# Patient Record
Sex: Female | Born: 1935 | Race: White | Hispanic: No | Marital: Single | Smoking: Never smoker
Health system: Southern US, Community
[De-identification: ages and names within clinical notes are randomized; demographics above are authoritative.]

## PROBLEM LIST (undated history)

## (undated) DIAGNOSIS — S72009A Fracture of unspecified part of neck of unspecified femur, initial encounter for closed fracture: Secondary | ICD-10-CM

## (undated) DIAGNOSIS — C50919 Malignant neoplasm of unspecified site of unspecified female breast: Secondary | ICD-10-CM

## (undated) DIAGNOSIS — F329 Major depressive disorder, single episode, unspecified: Secondary | ICD-10-CM

## (undated) DIAGNOSIS — K219 Gastro-esophageal reflux disease without esophagitis: Secondary | ICD-10-CM

## (undated) DIAGNOSIS — Z87898 Personal history of other specified conditions: Secondary | ICD-10-CM

## (undated) DIAGNOSIS — D649 Anemia, unspecified: Secondary | ICD-10-CM

## (undated) DIAGNOSIS — I2699 Other pulmonary embolism without acute cor pulmonale: Secondary | ICD-10-CM

## (undated) DIAGNOSIS — G629 Polyneuropathy, unspecified: Secondary | ICD-10-CM

## (undated) DIAGNOSIS — I10 Essential (primary) hypertension: Secondary | ICD-10-CM

## (undated) DIAGNOSIS — F32A Depression, unspecified: Secondary | ICD-10-CM

## (undated) DIAGNOSIS — E079 Disorder of thyroid, unspecified: Secondary | ICD-10-CM

## (undated) DIAGNOSIS — R42 Dizziness and giddiness: Secondary | ICD-10-CM

## (undated) DIAGNOSIS — I639 Cerebral infarction, unspecified: Secondary | ICD-10-CM

## (undated) DIAGNOSIS — R251 Tremor, unspecified: Secondary | ICD-10-CM

## (undated) DIAGNOSIS — S72002D Fracture of unspecified part of neck of left femur, subsequent encounter for closed fracture with routine healing: Secondary | ICD-10-CM

## (undated) DIAGNOSIS — E0590 Thyrotoxicosis, unspecified without thyrotoxic crisis or storm: Secondary | ICD-10-CM

## (undated) DIAGNOSIS — R0600 Dyspnea, unspecified: Secondary | ICD-10-CM

## (undated) DIAGNOSIS — F419 Anxiety disorder, unspecified: Secondary | ICD-10-CM

## (undated) DIAGNOSIS — M810 Age-related osteoporosis without current pathological fracture: Secondary | ICD-10-CM

## (undated) DIAGNOSIS — R519 Headache, unspecified: Secondary | ICD-10-CM

## (undated) DIAGNOSIS — I4891 Unspecified atrial fibrillation: Secondary | ICD-10-CM

## (undated) DIAGNOSIS — M797 Fibromyalgia: Secondary | ICD-10-CM

## (undated) DIAGNOSIS — C801 Malignant (primary) neoplasm, unspecified: Secondary | ICD-10-CM

## (undated) DIAGNOSIS — K589 Irritable bowel syndrome without diarrhea: Secondary | ICD-10-CM

## (undated) DIAGNOSIS — R51 Headache: Secondary | ICD-10-CM

## (undated) DIAGNOSIS — M1990 Unspecified osteoarthritis, unspecified site: Secondary | ICD-10-CM

## (undated) HISTORY — PX: COLON SURGERY: SHX602

## (undated) HISTORY — PX: ABDOMINAL HYSTERECTOMY: SHX81

## (undated) HISTORY — DX: Malignant neoplasm of unspecified site of unspecified female breast: C50.919

## (undated) HISTORY — PX: VENA CAVA FILTER PLACEMENT: SUR1032

## (undated) HISTORY — DX: Fracture of unspecified part of neck of left femur, subsequent encounter for closed fracture with routine healing: S72.002D

## (undated) HISTORY — PX: CATARACT EXTRACTION: SUR2

## (undated) HISTORY — DX: Fibromyalgia: M79.7

## (undated) HISTORY — PX: CHOLECYSTECTOMY: SHX55

## (undated) HISTORY — DX: Malignant (primary) neoplasm, unspecified: C80.1

## (undated) HISTORY — DX: Other pulmonary embolism without acute cor pulmonale: I26.99

## (undated) HISTORY — PX: MASTECTOMY: SHX3

## (undated) HISTORY — DX: Fracture of unspecified part of neck of unspecified femur, initial encounter for closed fracture: S72.009A

---

## 2003-12-05 ENCOUNTER — Ambulatory Visit: Payer: Self-pay | Admitting: Physician Assistant

## 2004-01-06 ENCOUNTER — Ambulatory Visit: Payer: Self-pay | Admitting: Otolaryngology

## 2004-03-14 ENCOUNTER — Ambulatory Visit: Payer: Self-pay

## 2004-03-16 ENCOUNTER — Ambulatory Visit: Payer: Self-pay

## 2004-04-23 ENCOUNTER — Ambulatory Visit: Payer: Self-pay | Admitting: Ophthalmology

## 2004-04-30 ENCOUNTER — Ambulatory Visit: Payer: Self-pay | Admitting: Ophthalmology

## 2004-08-08 ENCOUNTER — Ambulatory Visit: Payer: Self-pay | Admitting: Internal Medicine

## 2005-08-16 ENCOUNTER — Ambulatory Visit: Payer: Self-pay | Admitting: Internal Medicine

## 2006-01-28 ENCOUNTER — Ambulatory Visit: Payer: Self-pay | Admitting: Internal Medicine

## 2006-03-30 ENCOUNTER — Other Ambulatory Visit: Payer: Self-pay

## 2006-03-30 ENCOUNTER — Inpatient Hospital Stay: Payer: Self-pay | Admitting: Internal Medicine

## 2007-05-25 ENCOUNTER — Emergency Department: Payer: Self-pay | Admitting: Emergency Medicine

## 2007-08-11 ENCOUNTER — Inpatient Hospital Stay: Payer: Self-pay | Admitting: Unknown Physician Specialty

## 2007-08-12 ENCOUNTER — Other Ambulatory Visit: Payer: Self-pay

## 2007-10-09 ENCOUNTER — Ambulatory Visit: Payer: Self-pay | Admitting: Internal Medicine

## 2007-10-27 ENCOUNTER — Ambulatory Visit: Payer: Self-pay | Admitting: Surgery

## 2007-11-23 ENCOUNTER — Ambulatory Visit: Payer: Self-pay | Admitting: Surgery

## 2007-12-17 ENCOUNTER — Inpatient Hospital Stay: Payer: Self-pay | Admitting: Surgery

## 2007-12-24 ENCOUNTER — Ambulatory Visit: Payer: Self-pay | Admitting: Surgery

## 2008-01-03 ENCOUNTER — Ambulatory Visit: Payer: Self-pay | Admitting: Oncology

## 2008-01-11 ENCOUNTER — Ambulatory Visit: Payer: Self-pay | Admitting: Oncology

## 2008-02-02 ENCOUNTER — Ambulatory Visit: Payer: Self-pay | Admitting: Oncology

## 2010-07-11 ENCOUNTER — Ambulatory Visit: Payer: Self-pay

## 2010-12-20 ENCOUNTER — Ambulatory Visit: Payer: Self-pay | Admitting: Gastroenterology

## 2011-01-09 ENCOUNTER — Ambulatory Visit: Payer: Self-pay | Admitting: Gastroenterology

## 2011-01-14 LAB — PATHOLOGY REPORT

## 2011-03-11 ENCOUNTER — Other Ambulatory Visit: Payer: Self-pay | Admitting: Gastroenterology

## 2011-10-17 ENCOUNTER — Ambulatory Visit: Payer: Self-pay | Admitting: Physical Medicine and Rehabilitation

## 2011-11-06 ENCOUNTER — Inpatient Hospital Stay: Payer: Self-pay | Admitting: Internal Medicine

## 2011-11-06 DIAGNOSIS — I369 Nonrheumatic tricuspid valve disorder, unspecified: Secondary | ICD-10-CM

## 2011-11-06 LAB — CK TOTAL AND CKMB (NOT AT ARMC)
CK, Total: 74 U/L (ref 21–215)
CK-MB: 0.9 ng/mL (ref 0.5–3.6)

## 2011-11-06 LAB — URINALYSIS, COMPLETE
Bilirubin,UR: NEGATIVE
Glucose,UR: NEGATIVE mg/dL (ref 0–75)
Ph: 5 (ref 4.5–8.0)
Specific Gravity: 1.057 (ref 1.003–1.030)
Squamous Epithelial: NONE SEEN
WBC UR: 22 /HPF (ref 0–5)

## 2011-11-06 LAB — CBC
MCH: 29.9 pg (ref 26.0–34.0)
MCV: 89 fL (ref 80–100)
Platelet: 228 10*3/uL (ref 150–440)
RBC: 4.44 10*6/uL (ref 3.80–5.20)
RDW: 14.2 % (ref 11.5–14.5)

## 2011-11-06 LAB — COMPREHENSIVE METABOLIC PANEL
Alkaline Phosphatase: 76 U/L (ref 50–136)
BUN: 14 mg/dL (ref 7–18)
Bilirubin,Total: 0.6 mg/dL (ref 0.2–1.0)
Chloride: 104 mmol/L (ref 98–107)
Co2: 25 mmol/L (ref 21–32)
Creatinine: 0.99 mg/dL (ref 0.60–1.30)
Osmolality: 273 (ref 275–301)
Potassium: 3.7 mmol/L (ref 3.5–5.1)
SGPT (ALT): 18 U/L (ref 12–78)
Sodium: 136 mmol/L (ref 136–145)
Total Protein: 8.2 g/dL (ref 6.4–8.2)

## 2011-11-06 LAB — APTT
Activated PTT: 31.7 secs (ref 23.6–35.9)
Activated PTT: >160 secs (ref 23.6–35.9)

## 2011-11-06 LAB — PROTIME-INR
INR: 1
Prothrombin Time: 13.8 secs (ref 11.5–14.7)

## 2011-11-06 LAB — LIPASE, BLOOD: Lipase: 108 U/L (ref 73–393)

## 2011-11-06 LAB — TROPONIN I: Troponin-I: <0.02 ng/mL

## 2011-11-07 LAB — CBC WITH DIFFERENTIAL/PLATELET
Basophil #: 0.1 10*3/uL (ref 0.0–0.1)
Eosinophil #: 0.1 10*3/uL (ref 0.0–0.7)
Eosinophil %: 0.4 %
HGB: 12.3 g/dL (ref 12.0–16.0)
Lymphocyte #: 1.1 10*3/uL (ref 1.0–3.6)
Lymphocyte %: 7.5 %
MCH: 29.9 pg (ref 26.0–34.0)
MCHC: 32.6 g/dL (ref 32.0–36.0)
Monocyte #: 2.2 x10 3/mm — ABNORMAL HIGH (ref 0.2–0.9)
Neutrophil %: 76.6 %
Platelet: 220 10*3/uL (ref 150–440)
RDW: 14.5 % (ref 11.5–14.5)

## 2011-11-07 LAB — APTT
Activated PTT: 135.5 secs — ABNORMAL HIGH (ref 23.6–35.9)
Activated PTT: 113.7 secs — ABNORMAL HIGH (ref 23.6–35.9)

## 2011-11-07 LAB — BASIC METABOLIC PANEL
Anion Gap: 7 (ref 7–16)
BUN: 13 mg/dL (ref 7–18)
Calcium, Total: 8.6 mg/dL (ref 8.5–10.1)
Chloride: 105 mmol/L (ref 98–107)
Creatinine: 0.88 mg/dL (ref 0.60–1.30)
EGFR (Non-African Amer.): >60
Glucose: 146 mg/dL — ABNORMAL HIGH (ref 65–99)
Osmolality: 277 (ref 275–301)

## 2011-11-07 LAB — CK TOTAL AND CKMB (NOT AT ARMC)
CK, Total: 62 U/L (ref 21–215)
CK-MB: 0.7 ng/mL (ref 0.5–3.6)

## 2011-11-07 LAB — MAGNESIUM: Magnesium: 1.8 mg/dL

## 2011-11-07 LAB — TROPONIN I: Troponin-I: <0.02 ng/mL

## 2011-11-11 LAB — BASIC METABOLIC PANEL
Calcium, Total: 9.3 mg/dL (ref 8.5–10.1)
Co2: 28 mmol/L (ref 21–32)
Creatinine: 0.66 mg/dL (ref 0.60–1.30)
EGFR (African American): >60
EGFR (Non-African Amer.): >60
Osmolality: 273 (ref 275–301)
Potassium: 3.8 mmol/L (ref 3.5–5.1)
Sodium: 136 mmol/L (ref 136–145)

## 2011-11-11 LAB — CBC WITH DIFFERENTIAL/PLATELET
Basophil #: 0.1 10*3/uL (ref 0.0–0.1)
Basophil %: 0.6 %
Eosinophil #: 0.4 10*3/uL (ref 0.0–0.7)
HCT: 34.1 % — ABNORMAL LOW (ref 35.0–47.0)
HGB: 11.4 g/dL — ABNORMAL LOW (ref 12.0–16.0)
Lymphocyte %: 16.6 %
MCH: 29.9 pg (ref 26.0–34.0)
MCHC: 33.4 g/dL (ref 32.0–36.0)
Monocyte #: 1.2 x10 3/mm — ABNORMAL HIGH (ref 0.2–0.9)
Neutrophil #: 5.8 10*3/uL (ref 1.4–6.5)
Neutrophil %: 65 %
RDW: 14.1 % (ref 11.5–14.5)

## 2011-11-14 LAB — URINE CULTURE

## 2011-12-05 ENCOUNTER — Emergency Department: Payer: Self-pay | Admitting: Emergency Medicine

## 2011-12-05 LAB — COMPREHENSIVE METABOLIC PANEL
Albumin: 3.2 g/dL — ABNORMAL LOW (ref 3.4–5.0)
Anion Gap: 9 (ref 7–16)
BUN: 11 mg/dL (ref 7–18)
Chloride: 104 mmol/L (ref 98–107)
EGFR (African American): >60
Glucose: 104 mg/dL — ABNORMAL HIGH (ref 65–99)
Osmolality: 285 (ref 275–301)
Potassium: 2.8 mmol/L — ABNORMAL LOW (ref 3.5–5.1)
Sodium: 143 mmol/L (ref 136–145)
Total Protein: 7.5 g/dL (ref 6.4–8.2)

## 2011-12-05 LAB — URINALYSIS, COMPLETE
Glucose,UR: NEGATIVE mg/dL (ref 0–75)
Ketone: NEGATIVE
Nitrite: NEGATIVE
Ph: 7 (ref 4.5–8.0)
Protein: NEGATIVE
Specific Gravity: 1.01 (ref 1.003–1.030)

## 2011-12-05 LAB — CBC
HCT: 34.1 % — ABNORMAL LOW (ref 35.0–47.0)
MCH: 30.2 pg (ref 26.0–34.0)
MCV: 89 fL (ref 80–100)
WBC: 11 10*3/uL (ref 3.6–11.0)

## 2011-12-05 LAB — LIPASE, BLOOD: Lipase: 135 U/L (ref 73–393)

## 2012-09-23 ENCOUNTER — Inpatient Hospital Stay: Payer: Self-pay | Admitting: Internal Medicine

## 2012-09-23 LAB — CBC
HGB: 6 g/dL — ABNORMAL LOW (ref 12.0–16.0)
MCH: 19 pg — ABNORMAL LOW (ref 26.0–34.0)
MCV: 64 fL — ABNORMAL LOW (ref 80–100)
Platelet: 454 10*3/uL — ABNORMAL HIGH (ref 150–440)
RBC: 3.18 10*6/uL — ABNORMAL LOW (ref 3.80–5.20)
RDW: 17.5 % — ABNORMAL HIGH (ref 11.5–14.5)

## 2012-09-23 LAB — COMPREHENSIVE METABOLIC PANEL
Albumin: 3.5 g/dL (ref 3.4–5.0)
Alkaline Phosphatase: 83 U/L (ref 50–136)
Anion Gap: 6 — ABNORMAL LOW (ref 7–16)
BUN: 15 mg/dL (ref 7–18)
Calcium, Total: 8.7 mg/dL (ref 8.5–10.1)
Chloride: 101 mmol/L (ref 98–107)
Creatinine: 1.19 mg/dL (ref 0.60–1.30)
EGFR (African American): 50 — ABNORMAL LOW
Glucose: 102 mg/dL — ABNORMAL HIGH (ref 65–99)
Potassium: 3.6 mmol/L (ref 3.5–5.1)
SGOT(AST): 18 U/L (ref 15–37)
SGPT (ALT): 13 U/L (ref 12–78)
Sodium: 133 mmol/L — ABNORMAL LOW (ref 136–145)
Total Protein: 8.1 g/dL (ref 6.4–8.2)

## 2012-09-23 LAB — TROPONIN I: Troponin-I: <0.02 ng/mL

## 2012-09-24 LAB — BASIC METABOLIC PANEL
Anion Gap: 6 — ABNORMAL LOW (ref 7–16)
EGFR (Non-African Amer.): 58 — ABNORMAL LOW
Glucose: 91 mg/dL (ref 65–99)
Osmolality: 276 (ref 275–301)
Potassium: 3.9 mmol/L (ref 3.5–5.1)
Sodium: 139 mmol/L (ref 136–145)

## 2012-09-24 LAB — CBC WITH DIFFERENTIAL/PLATELET
Basophil #: 0.1 10*3/uL (ref 0.0–0.1)
Basophil %: 0.9 %
Eosinophil #: 0.5 10*3/uL (ref 0.0–0.7)
Eosinophil %: 6.7 %
HCT: 25.1 % — ABNORMAL LOW (ref 35.0–47.0)
HGB: 8.2 g/dL — ABNORMAL LOW (ref 12.0–16.0)
Monocyte #: 1.1 x10 3/mm — ABNORMAL HIGH (ref 0.2–0.9)
Neutrophil #: 3.4 10*3/uL (ref 1.4–6.5)
Platelet: 342 10*3/uL (ref 150–440)
WBC: 6.7 10*3/uL (ref 3.6–11.0)

## 2012-09-24 LAB — URINALYSIS, COMPLETE
Glucose,UR: NEGATIVE mg/dL (ref 0–75)
Ketone: NEGATIVE
Protein: NEGATIVE
Specific Gravity: 1.009 (ref 1.003–1.030)

## 2012-09-25 LAB — WBCS, STOOL

## 2012-09-25 LAB — CBC WITH DIFFERENTIAL/PLATELET
Basophil #: 0.1 10*3/uL (ref 0.0–0.1)
Eosinophil %: 6.6 %
HGB: 8.5 g/dL — ABNORMAL LOW (ref 12.0–16.0)
Lymphocyte #: 2.2 10*3/uL (ref 1.0–3.6)
Lymphocyte %: 28 %
MCH: 22.5 pg — ABNORMAL LOW (ref 26.0–34.0)
MCHC: 32.3 g/dL (ref 32.0–36.0)
MCV: 70 fL — ABNORMAL LOW (ref 80–100)
Monocyte %: 13.2 %
Neutrophil #: 4.1 10*3/uL (ref 1.4–6.5)
WBC: 8 10*3/uL (ref 3.6–11.0)

## 2012-09-25 LAB — OCCULT BLOOD X 1 CARD TO LAB, STOOL
Occult Blood, Feces: NEGATIVE
Occult Blood, Feces: NEGATIVE

## 2012-09-26 LAB — STOOL CULTURE

## 2012-10-16 ENCOUNTER — Ambulatory Visit: Payer: Self-pay | Admitting: Surgery

## 2012-10-22 ENCOUNTER — Ambulatory Visit: Payer: Self-pay | Admitting: Surgery

## 2012-10-22 LAB — HEMOGLOBIN: HGB: 10.5 g/dL — ABNORMAL LOW (ref 12.0–16.0)

## 2012-10-29 ENCOUNTER — Inpatient Hospital Stay: Payer: Self-pay | Admitting: Surgery

## 2012-10-30 LAB — CBC WITH DIFFERENTIAL/PLATELET
Basophil #: 0 10*3/uL (ref 0.0–0.1)
Basophil %: 0.2 %
Basophil: 1 %
Eosinophil #: 0 10*3/uL (ref 0.0–0.7)
HCT: 37 % (ref 35.0–47.0)
HGB: 12 g/dL (ref 12.0–16.0)
Lymphocyte #: 1.5 10*3/uL (ref 1.0–3.6)
Lymphocyte %: 9.7 %
Lymphocytes: 12 %
MCHC: 32.5 g/dL (ref 32.0–36.0)
MCV: 80 fL (ref 80–100)
Monocyte #: 2.6 x10 3/mm — ABNORMAL HIGH (ref 0.2–0.9)
Monocyte %: 17.2 %
Monocytes: 15 %
Neutrophil #: 11.1 10*3/uL — ABNORMAL HIGH (ref 1.4–6.5)
Neutrophil %: 72.8 %
Platelet: 240 10*3/uL (ref 150–440)
WBC: 15.2 10*3/uL — ABNORMAL HIGH (ref 3.6–11.0)

## 2012-10-30 LAB — BASIC METABOLIC PANEL
BUN: 6 mg/dL — ABNORMAL LOW (ref 7–18)
Calcium, Total: 8.7 mg/dL (ref 8.5–10.1)
Co2: 29 mmol/L (ref 21–32)
Creatinine: 0.95 mg/dL (ref 0.60–1.30)
EGFR (African American): >60
Osmolality: 274 (ref 275–301)
Potassium: 3.8 mmol/L (ref 3.5–5.1)

## 2012-11-01 LAB — CBC WITH DIFFERENTIAL/PLATELET
Eosinophil #: 0.5 10*3/uL (ref 0.0–0.7)
HCT: 34.1 % — ABNORMAL LOW (ref 35.0–47.0)
HGB: 11.2 g/dL — ABNORMAL LOW (ref 12.0–16.0)
Lymphocyte %: 12.7 %
MCH: 26.2 pg (ref 26.0–34.0)
MCV: 80 fL (ref 80–100)
Monocyte #: 2 x10 3/mm — ABNORMAL HIGH (ref 0.2–0.9)
Monocyte %: 14.3 %
Neutrophil #: 9.9 10*3/uL — ABNORMAL HIGH (ref 1.4–6.5)
WBC: 14.2 10*3/uL — ABNORMAL HIGH (ref 3.6–11.0)

## 2012-11-01 LAB — URINALYSIS, COMPLETE
Bilirubin,UR: NEGATIVE
Glucose,UR: NEGATIVE mg/dL (ref 0–75)
Hyaline Cast: 31
Ketone: NEGATIVE
Nitrite: NEGATIVE
Ph: 5 (ref 4.5–8.0)
Protein: NEGATIVE
RBC,UR: 4 /HPF (ref 0–5)
Specific Gravity: 1.01 (ref 1.003–1.030)
WBC UR: 7 /HPF (ref 0–5)

## 2012-11-03 ENCOUNTER — Encounter: Payer: Self-pay | Admitting: General Surgery

## 2012-11-04 LAB — HEMOGLOBIN: HGB: 10.9 g/dL — ABNORMAL LOW (ref 12.0–16.0)

## 2012-11-05 LAB — PLATELET COUNT: Platelet: 321 10*3/uL (ref 150–440)

## 2012-11-05 LAB — CLOSTRIDIUM DIFFICILE BY PCR

## 2012-11-05 LAB — CREATININE, SERUM: Creatinine: 0.93 mg/dL (ref 0.60–1.30)

## 2012-11-09 ENCOUNTER — Inpatient Hospital Stay: Payer: Self-pay | Admitting: General Surgery

## 2012-11-09 LAB — CBC
HCT: 30.4 % — ABNORMAL LOW (ref 35.0–47.0)
MCHC: 33.3 g/dL (ref 32.0–36.0)
MCV: 79 fL — ABNORMAL LOW (ref 80–100)
RBC: 3.82 10*6/uL (ref 3.80–5.20)
RDW: 26.9 % — ABNORMAL HIGH (ref 11.5–14.5)
WBC: 18.9 10*3/uL — ABNORMAL HIGH (ref 3.6–11.0)

## 2012-11-09 LAB — COMPREHENSIVE METABOLIC PANEL
Albumin: 2.5 g/dL — ABNORMAL LOW (ref 3.4–5.0)
Anion Gap: 8 (ref 7–16)
BUN: 13 mg/dL (ref 7–18)
Bilirubin,Total: 0.4 mg/dL (ref 0.2–1.0)
Chloride: 96 mmol/L — ABNORMAL LOW (ref 98–107)
Co2: 27 mmol/L (ref 21–32)
Creatinine: 1.01 mg/dL (ref 0.60–1.30)
EGFR (African American): >60
EGFR (Non-African Amer.): 53 — ABNORMAL LOW
SGOT(AST): 61 U/L — ABNORMAL HIGH (ref 15–37)
SGPT (ALT): 44 U/L (ref 12–78)
Sodium: 131 mmol/L — ABNORMAL LOW (ref 136–145)
Total Protein: 7.8 g/dL (ref 6.4–8.2)

## 2012-11-10 LAB — APTT: Activated PTT: 29.2 secs (ref 23.6–35.9)

## 2012-11-10 LAB — PROTIME-INR
INR: 1.1
Prothrombin Time: 14.4 secs (ref 11.5–14.7)

## 2012-11-10 LAB — URINALYSIS, COMPLETE
Bilirubin,UR: NEGATIVE
Ketone: NEGATIVE
Ph: 6 (ref 4.5–8.0)
RBC,UR: 4 /HPF (ref 0–5)
Specific Gravity: 1.057 (ref 1.003–1.030)
WBC UR: 40 /HPF (ref 0–5)

## 2012-11-11 LAB — BASIC METABOLIC PANEL
Anion Gap: 7 (ref 7–16)
BUN: 4 mg/dL — ABNORMAL LOW (ref 7–18)
Calcium, Total: 8.4 mg/dL — ABNORMAL LOW (ref 8.5–10.1)
EGFR (Non-African Amer.): 52 — ABNORMAL LOW
Sodium: 134 mmol/L — ABNORMAL LOW (ref 136–145)

## 2012-11-11 LAB — CLOSTRIDIUM DIFFICILE BY PCR

## 2012-11-11 LAB — CBC WITH DIFFERENTIAL/PLATELET
Basophil #: 0.1 10*3/uL (ref 0.0–0.1)
Eosinophil #: 0.2 10*3/uL (ref 0.0–0.7)
Eosinophil %: 1.4 %
Lymphocyte %: 11.9 %
MCH: 26.6 pg (ref 26.0–34.0)
MCHC: 32.8 g/dL (ref 32.0–36.0)
MCV: 81 fL (ref 80–100)
Neutrophil #: 10.8 10*3/uL — ABNORMAL HIGH (ref 1.4–6.5)
Neutrophil %: 74.7 %
Platelet: 352 10*3/uL (ref 150–440)
RDW: 26.5 % — ABNORMAL HIGH (ref 11.5–14.5)
WBC: 14.5 10*3/uL — ABNORMAL HIGH (ref 3.6–11.0)

## 2012-11-11 LAB — PATHOLOGY REPORT

## 2012-11-14 LAB — CULTURE, BLOOD (SINGLE)

## 2012-11-15 LAB — MISC AER/ANAEROBIC CULT.

## 2012-11-16 LAB — CBC WITH DIFFERENTIAL/PLATELET
Basophil %: 0.8 %
Eosinophil #: 0.4 10*3/uL (ref 0.0–0.7)
Eosinophil %: 4.5 %
HCT: 27.5 % — ABNORMAL LOW (ref 35.0–47.0)
HGB: 9 g/dL — ABNORMAL LOW (ref 12.0–16.0)
Lymphocyte #: 2.1 10*3/uL (ref 1.0–3.6)
Lymphocyte %: 23.1 %
Monocyte #: 1.4 x10 3/mm — ABNORMAL HIGH (ref 0.2–0.9)
Monocyte %: 15.1 %
Neutrophil #: 5.1 10*3/uL (ref 1.4–6.5)
Neutrophil %: 56.5 %
RBC: 3.4 10*6/uL — ABNORMAL LOW (ref 3.80–5.20)
WBC: 9 10*3/uL (ref 3.6–11.0)

## 2012-11-16 LAB — COMPREHENSIVE METABOLIC PANEL
Albumin: 2.1 g/dL — ABNORMAL LOW (ref 3.4–5.0)
Alkaline Phosphatase: 124 U/L (ref 50–136)
Anion Gap: 6 — ABNORMAL LOW (ref 7–16)
Bilirubin,Total: 0.2 mg/dL (ref 0.2–1.0)
Calcium, Total: 9.1 mg/dL (ref 8.5–10.1)
Chloride: 101 mmol/L (ref 98–107)
Co2: 26 mmol/L (ref 21–32)
EGFR (African American): >60
EGFR (Non-African Amer.): >60
Glucose: 97 mg/dL (ref 65–99)
Osmolality: 264 (ref 275–301)
Potassium: 4.3 mmol/L (ref 3.5–5.1)
SGOT(AST): 37 U/L (ref 15–37)
SGPT (ALT): 23 U/L (ref 12–78)
Sodium: 133 mmol/L — ABNORMAL LOW (ref 136–145)
Total Protein: 6.9 g/dL (ref 6.4–8.2)

## 2012-11-16 LAB — PROTIME-INR: INR: 1

## 2012-11-18 ENCOUNTER — Inpatient Hospital Stay: Payer: Self-pay | Admitting: Surgery

## 2012-11-18 LAB — CBC WITH DIFFERENTIAL/PLATELET
Basophil #: 0.1 10*3/uL (ref 0.0–0.1)
Basophil %: 0.8 %
Eosinophil #: 0.2 10*3/uL (ref 0.0–0.7)
Eosinophil %: 1.2 %
HCT: 34.2 % — ABNORMAL LOW (ref 35.0–47.0)
HGB: 11.1 g/dL — ABNORMAL LOW (ref 12.0–16.0)
Lymphocyte #: 2.3 10*3/uL (ref 1.0–3.6)
Lymphocyte %: 14.6 %
MCH: 26.3 pg (ref 26.0–34.0)
MCV: 82 fL (ref 80–100)
Monocyte #: 1.4 x10 3/mm — ABNORMAL HIGH (ref 0.2–0.9)
Monocyte %: 9.1 %
Neutrophil #: 11.6 10*3/uL — ABNORMAL HIGH (ref 1.4–6.5)
Platelet: 543 10*3/uL — ABNORMAL HIGH (ref 150–440)
RBC: 4.2 10*6/uL (ref 3.80–5.20)
WBC: 15.6 10*3/uL — ABNORMAL HIGH (ref 3.6–11.0)

## 2012-11-18 LAB — COMPREHENSIVE METABOLIC PANEL
Albumin: 2.5 g/dL — ABNORMAL LOW (ref 3.4–5.0)
Anion Gap: 7 (ref 7–16)
Calcium, Total: 9.5 mg/dL (ref 8.5–10.1)
Chloride: 104 mmol/L (ref 98–107)
Co2: 24 mmol/L (ref 21–32)
Creatinine: 1.03 mg/dL (ref 0.60–1.30)
EGFR (African American): 59 — ABNORMAL LOW
EGFR (Non-African Amer.): 51 — ABNORMAL LOW
Osmolality: 268 (ref 275–301)
Potassium: 4.2 mmol/L (ref 3.5–5.1)
SGPT (ALT): 56 U/L (ref 12–78)
Sodium: 135 mmol/L — ABNORMAL LOW (ref 136–145)

## 2012-11-18 LAB — URINALYSIS, COMPLETE
Glucose,UR: NEGATIVE mg/dL (ref 0–75)
Ketone: NEGATIVE
Leukocyte Esterase: NEGATIVE
RBC,UR: 1 /HPF (ref 0–5)
WBC UR: 3 /HPF (ref 0–5)

## 2012-11-19 LAB — CBC WITH DIFFERENTIAL/PLATELET
Basophil %: 0.5 %
Eosinophil #: 0.2 10*3/uL (ref 0.0–0.7)
Lymphocyte #: 2.4 10*3/uL (ref 1.0–3.6)
Lymphocyte %: 18.1 %
MCH: 26.2 pg (ref 26.0–34.0)
MCV: 81 fL (ref 80–100)
Platelet: 448 10*3/uL — ABNORMAL HIGH (ref 150–440)
RBC: 3.68 10*6/uL — ABNORMAL LOW (ref 3.80–5.20)
RDW: 25.6 % — ABNORMAL HIGH (ref 11.5–14.5)

## 2012-11-19 LAB — BASIC METABOLIC PANEL
Calcium, Total: 8.8 mg/dL (ref 8.5–10.1)
EGFR (African American): >60
EGFR (Non-African Amer.): 54 — ABNORMAL LOW
Glucose: 116 mg/dL — ABNORMAL HIGH (ref 65–99)
Osmolality: 273 (ref 275–301)

## 2012-11-19 LAB — CLOSTRIDIUM DIFFICILE BY PCR

## 2012-11-23 ENCOUNTER — Encounter: Payer: Self-pay | Admitting: General Surgery

## 2012-11-24 ENCOUNTER — Encounter: Payer: Self-pay | Admitting: General Surgery

## 2012-11-27 LAB — BASIC METABOLIC PANEL
Anion Gap: 6 — ABNORMAL LOW (ref 7–16)
Calcium, Total: 8.5 mg/dL (ref 8.5–10.1)
Co2: 26 mmol/L (ref 21–32)
Creatinine: 0.9 mg/dL (ref 0.60–1.30)
EGFR (African American): >60
Glucose: 98 mg/dL (ref 65–99)
Osmolality: 275 (ref 275–301)
Potassium: 3.4 mmol/L — ABNORMAL LOW (ref 3.5–5.1)

## 2012-11-27 LAB — CBC WITH DIFFERENTIAL/PLATELET
Basophil %: 1.1 %
Eosinophil %: 7.6 %
Lymphocyte #: 2.2 10*3/uL (ref 1.0–3.6)
Lymphocyte %: 29.3 %
MCHC: 33.1 g/dL (ref 32.0–36.0)
MCV: 83 fL (ref 80–100)
Platelet: 278 10*3/uL (ref 150–440)

## 2012-11-27 LAB — ALBUMIN: Albumin: 2.2 g/dL — ABNORMAL LOW (ref 3.4–5.0)

## 2012-12-11 ENCOUNTER — Other Ambulatory Visit: Payer: Self-pay | Admitting: Gastroenterology

## 2012-12-13 LAB — STOOL CULTURE

## 2012-12-14 ENCOUNTER — Ambulatory Visit: Payer: Self-pay | Admitting: Oncology

## 2012-12-23 ENCOUNTER — Ambulatory Visit: Payer: Self-pay | Admitting: Oncology

## 2012-12-23 LAB — COMPREHENSIVE METABOLIC PANEL
Alkaline Phosphatase: 96 U/L (ref 50–136)
Anion Gap: 11 (ref 7–16)
BUN: 6 mg/dL — ABNORMAL LOW (ref 7–18)
Calcium, Total: 8.5 mg/dL (ref 8.5–10.1)
Co2: 24 mmol/L (ref 21–32)
Creatinine: 0.93 mg/dL (ref 0.60–1.30)
EGFR (Non-African Amer.): 58 — ABNORMAL LOW
Glucose: 112 mg/dL — ABNORMAL HIGH (ref 65–99)
Potassium: 3.4 mmol/L — ABNORMAL LOW (ref 3.5–5.1)
SGPT (ALT): 31 U/L (ref 12–78)
Sodium: 138 mmol/L (ref 136–145)
Total Protein: 7.9 g/dL (ref 6.4–8.2)

## 2012-12-23 LAB — CBC CANCER CENTER
Basophil %: 0.5 %
Eosinophil %: 4.2 %
HGB: 11.3 g/dL — ABNORMAL LOW (ref 12.0–16.0)
MCH: 28.4 pg (ref 26.0–34.0)
MCHC: 32.8 g/dL (ref 32.0–36.0)
MCV: 86 fL (ref 80–100)
Neutrophil #: 5.1 x10 3/mm (ref 1.4–6.5)
Neutrophil %: 59.7 %
Platelet: 275 x10 3/mm (ref 150–440)
RDW: 16.5 % — ABNORMAL HIGH (ref 11.5–14.5)

## 2013-01-02 ENCOUNTER — Ambulatory Visit: Payer: Self-pay | Admitting: Oncology

## 2013-01-15 LAB — CBC CANCER CENTER
Basophil #: 0.1 x10 3/mm (ref 0.0–0.1)
Basophil %: 0.8 %
Eosinophil #: 0.5 x10 3/mm (ref 0.0–0.7)
HCT: 36.9 % (ref 35.0–47.0)
Lymphocyte #: 2.2 x10 3/mm (ref 1.0–3.6)
Lymphocyte %: 25 %
MCH: 27.7 pg (ref 26.0–34.0)
MCHC: 31.9 g/dL — ABNORMAL LOW (ref 32.0–36.0)
MCV: 87 fL (ref 80–100)
Neutrophil %: 56.5 %
RBC: 4.26 10*6/uL (ref 3.80–5.20)
RDW: 15.6 % — ABNORMAL HIGH (ref 11.5–14.5)
WBC: 8.9 x10 3/mm (ref 3.6–11.0)

## 2013-01-15 LAB — BASIC METABOLIC PANEL
Anion Gap: 14 (ref 7–16)
BUN: 11 mg/dL (ref 7–18)
Co2: 22 mmol/L (ref 21–32)
Creatinine: 1.05 mg/dL (ref 0.60–1.30)
EGFR (African American): 58 — ABNORMAL LOW
EGFR (Non-African Amer.): 50 — ABNORMAL LOW
Osmolality: 278 (ref 275–301)
Potassium: 3.7 mmol/L (ref 3.5–5.1)

## 2013-02-01 ENCOUNTER — Ambulatory Visit: Payer: Self-pay | Admitting: Oncology

## 2013-02-24 LAB — COMPREHENSIVE METABOLIC PANEL
Anion Gap: 11 (ref 7–16)
BUN: 17 mg/dL (ref 7–18)
Bilirubin,Total: 0.4 mg/dL (ref 0.2–1.0)
Calcium, Total: 9.3 mg/dL (ref 8.5–10.1)
Chloride: 103 mmol/L (ref 98–107)
Co2: 25 mmol/L (ref 21–32)
EGFR (African American): 54 — ABNORMAL LOW
EGFR (Non-African Amer.): 46 — ABNORMAL LOW
Osmolality: 281 (ref 275–301)
Total Protein: 8.4 g/dL — ABNORMAL HIGH (ref 6.4–8.2)

## 2013-02-24 LAB — CBC CANCER CENTER
Basophil #: 0 x10 3/mm (ref 0.0–0.1)
Basophil %: 0.2 %
Eosinophil %: 0.5 %
HCT: 35 % (ref 35.0–47.0)
HGB: 11.2 g/dL — ABNORMAL LOW (ref 12.0–16.0)
Lymphocyte #: 1.3 x10 3/mm (ref 1.0–3.6)
MCH: 27.6 pg (ref 26.0–34.0)
MCHC: 31.9 g/dL — ABNORMAL LOW (ref 32.0–36.0)
Monocyte #: 1.3 x10 3/mm — ABNORMAL HIGH (ref 0.2–0.9)
Monocyte %: 10.8 %
Neutrophil %: 77.3 %
Platelet: 289 x10 3/mm (ref 150–440)
RDW: 15.8 % — ABNORMAL HIGH (ref 11.5–14.5)
WBC: 12 x10 3/mm — ABNORMAL HIGH (ref 3.6–11.0)

## 2013-03-04 ENCOUNTER — Ambulatory Visit: Payer: Self-pay | Admitting: Oncology

## 2013-03-10 ENCOUNTER — Inpatient Hospital Stay: Payer: Self-pay | Admitting: Internal Medicine

## 2013-03-10 LAB — URINALYSIS, COMPLETE
BLOOD: NEGATIVE
Bilirubin,UR: NEGATIVE
Glucose,UR: NEGATIVE mg/dL (ref 0–75)
Hyaline Cast: 10
Ketone: NEGATIVE
Nitrite: POSITIVE
Ph: 5 (ref 4.5–8.0)
Protein: 30
RBC,UR: 4 /HPF (ref 0–5)
Specific Gravity: 1.02 (ref 1.003–1.030)
Squamous Epithelial: 4
Transitional Epi: <1
WBC UR: 50 /HPF (ref 0–5)

## 2013-03-10 LAB — COMPREHENSIVE METABOLIC PANEL
ALBUMIN: 3.3 g/dL — AB (ref 3.4–5.0)
Alkaline Phosphatase: 96 U/L
Anion Gap: 6 — ABNORMAL LOW (ref 7–16)
BILIRUBIN TOTAL: 0.3 mg/dL (ref 0.2–1.0)
BUN: 14 mg/dL (ref 7–18)
CHLORIDE: 100 mmol/L (ref 98–107)
Calcium, Total: 8.6 mg/dL (ref 8.5–10.1)
Co2: 27 mmol/L (ref 21–32)
Creatinine: 1 mg/dL (ref 0.60–1.30)
EGFR (Non-African Amer.): 53 — ABNORMAL LOW
GLUCOSE: 157 mg/dL — AB (ref 65–99)
Osmolality: 270 (ref 275–301)
POTASSIUM: 3.6 mmol/L (ref 3.5–5.1)
SGOT(AST): 35 U/L (ref 15–37)
SGPT (ALT): 20 U/L (ref 12–78)
Sodium: 133 mmol/L — ABNORMAL LOW (ref 136–145)
Total Protein: 8.2 g/dL (ref 6.4–8.2)

## 2013-03-10 LAB — CBC WITH DIFFERENTIAL/PLATELET
BASOS PCT: 0.4 %
Basophil #: 0.1 10*3/uL (ref 0.0–0.1)
EOS ABS: 0 10*3/uL (ref 0.0–0.7)
EOS PCT: 0 %
HCT: 37.3 % (ref 35.0–47.0)
HGB: 12.1 g/dL (ref 12.0–16.0)
Lymphocyte #: 0.9 10*3/uL — ABNORMAL LOW (ref 1.0–3.6)
Lymphocyte %: 5.4 %
MCH: 27.7 pg (ref 26.0–34.0)
MCHC: 32.5 g/dL (ref 32.0–36.0)
MCV: 85 fL (ref 80–100)
Monocyte #: 0.9 x10 3/mm (ref 0.2–0.9)
Monocyte %: 5.5 %
Neutrophil #: 15 10*3/uL — ABNORMAL HIGH (ref 1.4–6.5)
Neutrophil %: 88.7 %
PLATELETS: 332 10*3/uL (ref 150–440)
RBC: 4.38 10*6/uL (ref 3.80–5.20)
RDW: 16 % — AB (ref 11.5–14.5)
WBC: 16.9 10*3/uL — ABNORMAL HIGH (ref 3.6–11.0)

## 2013-03-10 LAB — PROTIME-INR
INR: 1.4
PROTHROMBIN TIME: 17.1 s — AB (ref 11.5–14.7)

## 2013-03-10 LAB — APTT: ACTIVATED PTT: 32.6 s (ref 23.6–35.9)

## 2013-03-10 LAB — TROPONIN I: Troponin-I: <0.02 ng/mL

## 2013-03-11 LAB — CBC WITH DIFFERENTIAL/PLATELET
BASOS PCT: 0.5 %
Basophil #: 0.1 10*3/uL (ref 0.0–0.1)
EOS PCT: 1.3 %
Eosinophil #: 0.2 10*3/uL (ref 0.0–0.7)
HCT: 31.7 % — ABNORMAL LOW (ref 35.0–47.0)
HGB: 10.4 g/dL — ABNORMAL LOW (ref 12.0–16.0)
Lymphocyte #: 1.6 10*3/uL (ref 1.0–3.6)
Lymphocyte %: 11.9 %
MCH: 28.1 pg (ref 26.0–34.0)
MCHC: 32.9 g/dL (ref 32.0–36.0)
MCV: 86 fL (ref 80–100)
MONO ABS: 1.5 x10 3/mm — AB (ref 0.2–0.9)
Monocyte %: 11.3 %
NEUTROS PCT: 75 %
Neutrophil #: 10.2 10*3/uL — ABNORMAL HIGH (ref 1.4–6.5)
Platelet: 269 10*3/uL (ref 150–440)
RBC: 3.71 10*6/uL — AB (ref 3.80–5.20)
RDW: 15.9 % — ABNORMAL HIGH (ref 11.5–14.5)
WBC: 13.6 10*3/uL — ABNORMAL HIGH (ref 3.6–11.0)

## 2013-03-11 LAB — BASIC METABOLIC PANEL
Anion Gap: 5 — ABNORMAL LOW (ref 7–16)
BUN: 12 mg/dL (ref 7–18)
CHLORIDE: 102 mmol/L (ref 98–107)
CREATININE: 0.98 mg/dL (ref 0.60–1.30)
Calcium, Total: 8 mg/dL — ABNORMAL LOW (ref 8.5–10.1)
Co2: 27 mmol/L (ref 21–32)
EGFR (African American): >60
GFR CALC NON AF AMER: 54 — AB
Glucose: 113 mg/dL — ABNORMAL HIGH (ref 65–99)
Osmolality: 269 (ref 275–301)
Potassium: 3.8 mmol/L (ref 3.5–5.1)
Sodium: 134 mmol/L — ABNORMAL LOW (ref 136–145)

## 2013-03-11 LAB — CLOSTRIDIUM DIFFICILE(ARMC)

## 2013-03-12 LAB — URINE CULTURE

## 2013-03-16 ENCOUNTER — Ambulatory Visit: Payer: Self-pay | Admitting: Cardiothoracic Surgery

## 2013-03-16 LAB — BASIC METABOLIC PANEL
Anion Gap: 6 — ABNORMAL LOW (ref 7–16)
BUN: 14 mg/dL (ref 7–18)
CHLORIDE: 103 mmol/L (ref 98–107)
CO2: 27 mmol/L (ref 21–32)
Calcium, Total: 8.9 mg/dL (ref 8.5–10.1)
Creatinine: 0.92 mg/dL (ref 0.60–1.30)
GFR CALC NON AF AMER: 59 — AB
Glucose: 116 mg/dL — ABNORMAL HIGH (ref 65–99)
Osmolality: 273 (ref 275–301)
POTASSIUM: 3.6 mmol/L (ref 3.5–5.1)
Sodium: 136 mmol/L (ref 136–145)

## 2013-03-18 LAB — STOOL CULTURE

## 2013-03-18 LAB — HEMOGLOBIN: HGB: 10.1 g/dL — ABNORMAL LOW (ref 12.0–16.0)

## 2013-03-24 LAB — CBC CANCER CENTER
Basophil #: 0.1 x10 3/mm (ref 0.0–0.1)
Basophil %: 1 %
EOS ABS: 0.4 x10 3/mm (ref 0.0–0.7)
Eosinophil %: 4.2 %
HCT: 32.7 % — ABNORMAL LOW (ref 35.0–47.0)
HGB: 10.4 g/dL — ABNORMAL LOW (ref 12.0–16.0)
LYMPHS PCT: 33.2 %
Lymphocyte #: 3.3 x10 3/mm (ref 1.0–3.6)
MCH: 27.5 pg (ref 26.0–34.0)
MCHC: 31.9 g/dL — ABNORMAL LOW (ref 32.0–36.0)
MCV: 86 fL (ref 80–100)
MONO ABS: 1 x10 3/mm — AB (ref 0.2–0.9)
Monocyte %: 10.3 %
NEUTROS ABS: 5.1 x10 3/mm (ref 1.4–6.5)
Neutrophil %: 51.3 %
PLATELETS: 342 x10 3/mm (ref 150–440)
RBC: 3.79 10*6/uL — ABNORMAL LOW (ref 3.80–5.20)
RDW: 15.7 % — AB (ref 11.5–14.5)
WBC: 10 x10 3/mm (ref 3.6–11.0)

## 2013-03-24 LAB — COMPREHENSIVE METABOLIC PANEL
ALT: 17 U/L (ref 12–78)
Albumin: 3.3 g/dL — ABNORMAL LOW (ref 3.4–5.0)
Alkaline Phosphatase: 87 U/L
Anion Gap: 8 (ref 7–16)
BUN: 11 mg/dL (ref 7–18)
Bilirubin,Total: 0.1 mg/dL — ABNORMAL LOW (ref 0.2–1.0)
CALCIUM: 8.2 mg/dL — AB (ref 8.5–10.1)
CREATININE: 0.98 mg/dL (ref 0.60–1.30)
Chloride: 101 mmol/L (ref 98–107)
Co2: 28 mmol/L (ref 21–32)
EGFR (African American): >60
EGFR (Non-African Amer.): 54 — ABNORMAL LOW
Glucose: 110 mg/dL — ABNORMAL HIGH (ref 65–99)
OSMOLALITY: 274 (ref 275–301)
POTASSIUM: 3.9 mmol/L (ref 3.5–5.1)
SGOT(AST): 20 U/L (ref 15–37)
Sodium: 137 mmol/L (ref 136–145)
TOTAL PROTEIN: 8 g/dL (ref 6.4–8.2)

## 2013-04-04 ENCOUNTER — Ambulatory Visit: Payer: Self-pay | Admitting: Oncology

## 2013-05-05 ENCOUNTER — Ambulatory Visit: Payer: Self-pay | Admitting: Oncology

## 2013-05-05 LAB — COMPREHENSIVE METABOLIC PANEL
ALBUMIN: 3.3 g/dL — AB (ref 3.4–5.0)
ALT: 14 U/L (ref 12–78)
ANION GAP: 8 (ref 7–16)
AST: 21 U/L (ref 15–37)
Alkaline Phosphatase: 79 U/L
BUN: 23 mg/dL — ABNORMAL HIGH (ref 7–18)
Bilirubin,Total: 0.3 mg/dL (ref 0.2–1.0)
CO2: 28 mmol/L (ref 21–32)
CREATININE: 0.95 mg/dL (ref 0.60–1.30)
Calcium, Total: 8.4 mg/dL — ABNORMAL LOW (ref 8.5–10.1)
Chloride: 101 mmol/L (ref 98–107)
EGFR (Non-African Amer.): 57 — ABNORMAL LOW
Glucose: 94 mg/dL (ref 65–99)
Osmolality: 277 (ref 275–301)
Potassium: 4.7 mmol/L (ref 3.5–5.1)
Sodium: 137 mmol/L (ref 136–145)
Total Protein: 7.7 g/dL (ref 6.4–8.2)

## 2013-05-05 LAB — CBC CANCER CENTER
Basophil #: 0.1 x10 3/mm (ref 0.0–0.1)
Basophil %: 0.7 %
EOS PCT: 2.7 %
Eosinophil #: 0.2 x10 3/mm (ref 0.0–0.7)
HCT: 31.3 % — ABNORMAL LOW (ref 35.0–47.0)
HGB: 9.9 g/dL — AB (ref 12.0–16.0)
Lymphocyte #: 2.3 x10 3/mm (ref 1.0–3.6)
Lymphocyte %: 25.7 %
MCH: 26.9 pg (ref 26.0–34.0)
MCHC: 31.6 g/dL — ABNORMAL LOW (ref 32.0–36.0)
MCV: 85 fL (ref 80–100)
MONOS PCT: 12.7 %
Monocyte #: 1.2 x10 3/mm — ABNORMAL HIGH (ref 0.2–0.9)
NEUTROS ABS: 5.3 x10 3/mm (ref 1.4–6.5)
Neutrophil %: 58.2 %
Platelet: 241 x10 3/mm (ref 150–440)
RBC: 3.68 10*6/uL — AB (ref 3.80–5.20)
RDW: 16.1 % — ABNORMAL HIGH (ref 11.5–14.5)
WBC: 9.1 x10 3/mm (ref 3.6–11.0)

## 2013-06-02 ENCOUNTER — Ambulatory Visit: Payer: Self-pay | Admitting: Oncology

## 2013-06-03 LAB — CBC CANCER CENTER
Basophil #: 0.1 x10 3/mm (ref 0.0–0.1)
Basophil %: 0.6 %
Eosinophil #: 0.2 x10 3/mm (ref 0.0–0.7)
Eosinophil %: 2.6 %
HCT: 34.9 % — AB (ref 35.0–47.0)
HGB: 11.2 g/dL — ABNORMAL LOW (ref 12.0–16.0)
Lymphocyte #: 1.8 x10 3/mm (ref 1.0–3.6)
Lymphocyte %: 22.5 %
MCH: 28.3 pg (ref 26.0–34.0)
MCHC: 32 g/dL (ref 32.0–36.0)
MCV: 88 fL (ref 80–100)
MONO ABS: 1 x10 3/mm — AB (ref 0.2–0.9)
Monocyte %: 12.6 %
NEUTROS ABS: 5 x10 3/mm (ref 1.4–6.5)
Neutrophil %: 61.7 %
PLATELETS: 252 x10 3/mm (ref 150–440)
RBC: 3.94 10*6/uL (ref 3.80–5.20)
RDW: 17.4 % — ABNORMAL HIGH (ref 11.5–14.5)
WBC: 8.1 x10 3/mm (ref 3.6–11.0)

## 2013-06-03 LAB — COMPREHENSIVE METABOLIC PANEL
ALK PHOS: 91 U/L
AST: 17 U/L (ref 15–37)
Albumin: 3.4 g/dL (ref 3.4–5.0)
Anion Gap: 4 — ABNORMAL LOW (ref 7–16)
BUN: 10 mg/dL (ref 7–18)
Bilirubin,Total: 0.3 mg/dL (ref 0.2–1.0)
CALCIUM: 8.9 mg/dL (ref 8.5–10.1)
CO2: 29 mmol/L (ref 21–32)
Chloride: 103 mmol/L (ref 98–107)
Creatinine: 1.01 mg/dL (ref 0.60–1.30)
EGFR (Non-African Amer.): 53 — ABNORMAL LOW
GLUCOSE: 103 mg/dL — AB (ref 65–99)
Osmolality: 271 (ref 275–301)
Potassium: 3.9 mmol/L (ref 3.5–5.1)
SGPT (ALT): 13 U/L (ref 12–78)
SODIUM: 136 mmol/L (ref 136–145)
TOTAL PROTEIN: 8.2 g/dL (ref 6.4–8.2)

## 2013-06-03 LAB — IRON AND TIBC
IRON BIND. CAP.(TOTAL): 325 ug/dL (ref 250–450)
IRON SATURATION: 9 %
Iron: 30 ug/dL — ABNORMAL LOW (ref 50–170)
UNBOUND IRON-BIND. CAP.: 295 ug/dL

## 2013-06-03 LAB — FERRITIN: FERRITIN (ARMC): 19 ng/mL (ref 8–388)

## 2013-07-01 LAB — COMPREHENSIVE METABOLIC PANEL
ALBUMIN: 3.4 g/dL (ref 3.4–5.0)
ALT: 21 U/L (ref 12–78)
Alkaline Phosphatase: 87 U/L
Anion Gap: 8 (ref 7–16)
BUN: 21 mg/dL — ABNORMAL HIGH (ref 7–18)
Bilirubin,Total: 0.4 mg/dL (ref 0.2–1.0)
CO2: 27 mmol/L (ref 21–32)
Calcium, Total: 9.1 mg/dL (ref 8.5–10.1)
Chloride: 103 mmol/L (ref 98–107)
Creatinine: 1.11 mg/dL (ref 0.60–1.30)
EGFR (Non-African Amer.): 47 — ABNORMAL LOW
GFR CALC AF AMER: 54 — AB
Glucose: 108 mg/dL — ABNORMAL HIGH (ref 65–99)
Osmolality: 279 (ref 275–301)
POTASSIUM: 4.3 mmol/L (ref 3.5–5.1)
SGOT(AST): 20 U/L (ref 15–37)
SODIUM: 138 mmol/L (ref 136–145)
Total Protein: 7.9 g/dL (ref 6.4–8.2)

## 2013-07-01 LAB — CBC CANCER CENTER
BASOS ABS: 0 x10 3/mm (ref 0.0–0.1)
Basophil %: 0.6 %
EOS ABS: 0.2 x10 3/mm (ref 0.0–0.7)
Eosinophil %: 2 %
HCT: 34.5 % — ABNORMAL LOW (ref 35.0–47.0)
HGB: 11.5 g/dL — ABNORMAL LOW (ref 12.0–16.0)
Lymphocyte #: 2.5 x10 3/mm (ref 1.0–3.6)
Lymphocyte %: 30.7 %
MCH: 28.9 pg (ref 26.0–34.0)
MCHC: 33.4 g/dL (ref 32.0–36.0)
MCV: 87 fL (ref 80–100)
Monocyte #: 0.8 x10 3/mm (ref 0.2–0.9)
Monocyte %: 9.3 %
NEUTROS ABS: 4.7 x10 3/mm (ref 1.4–6.5)
NEUTROS PCT: 57.4 %
PLATELETS: 250 x10 3/mm (ref 150–440)
RBC: 3.99 10*6/uL (ref 3.80–5.20)
RDW: 17.1 % — AB (ref 11.5–14.5)
WBC: 8.1 x10 3/mm (ref 3.6–11.0)

## 2013-07-02 ENCOUNTER — Ambulatory Visit: Payer: Self-pay | Admitting: Oncology

## 2013-07-07 ENCOUNTER — Ambulatory Visit: Payer: Self-pay | Admitting: Vascular Surgery

## 2013-08-26 ENCOUNTER — Ambulatory Visit: Payer: Self-pay | Admitting: Oncology

## 2013-09-01 ENCOUNTER — Ambulatory Visit: Payer: Self-pay | Admitting: Oncology

## 2013-09-29 LAB — CBC CANCER CENTER
BASOS PCT: 1 %
Basophil #: 0.1 x10 3/mm (ref 0.0–0.1)
EOS PCT: 2.6 %
Eosinophil #: 0.2 x10 3/mm (ref 0.0–0.7)
HCT: 32.8 % — AB (ref 35.0–47.0)
HGB: 10.6 g/dL — ABNORMAL LOW (ref 12.0–16.0)
LYMPHS ABS: 2.3 x10 3/mm (ref 1.0–3.6)
LYMPHS PCT: 31.2 %
MCH: 27.9 pg (ref 26.0–34.0)
MCHC: 32.4 g/dL (ref 32.0–36.0)
MCV: 86 fL (ref 80–100)
Monocyte #: 0.9 x10 3/mm (ref 0.2–0.9)
Monocyte %: 12.2 %
NEUTROS PCT: 53 %
Neutrophil #: 3.9 x10 3/mm (ref 1.4–6.5)
Platelet: 290 x10 3/mm (ref 150–440)
RBC: 3.8 10*6/uL (ref 3.80–5.20)
RDW: 15.5 % — ABNORMAL HIGH (ref 11.5–14.5)
WBC: 7.4 x10 3/mm (ref 3.6–11.0)

## 2013-09-29 LAB — COMPREHENSIVE METABOLIC PANEL
ALK PHOS: 90 U/L
AST: 19 U/L (ref 15–37)
Albumin: 3.4 g/dL (ref 3.4–5.0)
Anion Gap: 11 (ref 7–16)
BUN: 9 mg/dL (ref 7–18)
Bilirubin,Total: 0.3 mg/dL (ref 0.2–1.0)
CHLORIDE: 102 mmol/L (ref 98–107)
CO2: 26 mmol/L (ref 21–32)
Calcium, Total: 9.1 mg/dL (ref 8.5–10.1)
Creatinine: 1.16 mg/dL (ref 0.60–1.30)
EGFR (Non-African Amer.): 44 — ABNORMAL LOW
GFR CALC AF AMER: 51 — AB
GLUCOSE: 108 mg/dL — AB (ref 65–99)
Osmolality: 277 (ref 275–301)
Potassium: 3.9 mmol/L (ref 3.5–5.1)
SGPT (ALT): 18 U/L
SODIUM: 139 mmol/L (ref 136–145)
Total Protein: 7.8 g/dL (ref 6.4–8.2)

## 2013-10-02 ENCOUNTER — Ambulatory Visit: Payer: Self-pay | Admitting: Oncology

## 2013-10-19 ENCOUNTER — Emergency Department: Payer: Self-pay | Admitting: Emergency Medicine

## 2013-11-03 ENCOUNTER — Ambulatory Visit: Payer: Self-pay | Admitting: Oncology

## 2013-11-03 LAB — CBC CANCER CENTER
BASOS ABS: 0.1 x10 3/mm (ref 0.0–0.1)
Basophil %: 0.9 %
EOS ABS: 0.2 x10 3/mm (ref 0.0–0.7)
EOS PCT: 2 %
HCT: 32.7 % — ABNORMAL LOW (ref 35.0–47.0)
HGB: 10.5 g/dL — ABNORMAL LOW (ref 12.0–16.0)
LYMPHS ABS: 2.1 x10 3/mm (ref 1.0–3.6)
LYMPHS PCT: 27.1 %
MCH: 27.2 pg (ref 26.0–34.0)
MCHC: 32 g/dL (ref 32.0–36.0)
MCV: 85 fL (ref 80–100)
MONO ABS: 1 x10 3/mm — AB (ref 0.2–0.9)
MONOS PCT: 13.3 %
Neutrophil #: 4.4 x10 3/mm (ref 1.4–6.5)
Neutrophil %: 56.7 %
PLATELETS: 298 x10 3/mm (ref 150–440)
RBC: 3.85 10*6/uL (ref 3.80–5.20)
RDW: 16 % — AB (ref 11.5–14.5)
WBC: 7.8 x10 3/mm (ref 3.6–11.0)

## 2013-12-02 ENCOUNTER — Ambulatory Visit: Payer: Self-pay | Admitting: Oncology

## 2013-12-15 LAB — CBC CANCER CENTER
BASOS PCT: 1 %
Basophil #: 0.1 x10 3/mm (ref 0.0–0.1)
Eosinophil #: 0.2 x10 3/mm (ref 0.0–0.7)
Eosinophil %: 2.4 %
HCT: 30.8 % — ABNORMAL LOW (ref 35.0–47.0)
HGB: 9.8 g/dL — AB (ref 12.0–16.0)
Lymphocyte #: 2.6 x10 3/mm (ref 1.0–3.6)
Lymphocyte %: 29.3 %
MCH: 26.6 pg (ref 26.0–34.0)
MCHC: 31.9 g/dL — AB (ref 32.0–36.0)
MCV: 83 fL (ref 80–100)
MONOS PCT: 11.5 %
Monocyte #: 1 x10 3/mm — ABNORMAL HIGH (ref 0.2–0.9)
NEUTROS ABS: 4.9 x10 3/mm (ref 1.4–6.5)
NEUTROS PCT: 55.8 %
PLATELETS: 263 x10 3/mm (ref 150–440)
RBC: 3.7 10*6/uL — AB (ref 3.80–5.20)
RDW: 16.4 % — AB (ref 11.5–14.5)
WBC: 8.7 x10 3/mm (ref 3.6–11.0)

## 2013-12-15 LAB — COMPREHENSIVE METABOLIC PANEL
ALK PHOS: 89 U/L
Albumin: 3.4 g/dL (ref 3.4–5.0)
Anion Gap: 5 — ABNORMAL LOW (ref 7–16)
BILIRUBIN TOTAL: 0.2 mg/dL (ref 0.2–1.0)
BUN: 16 mg/dL (ref 7–18)
CALCIUM: 9 mg/dL (ref 8.5–10.1)
CREATININE: 1.06 mg/dL (ref 0.60–1.30)
Chloride: 103 mmol/L (ref 98–107)
Co2: 29 mmol/L (ref 21–32)
EGFR (African American): >60
GFR CALC NON AF AMER: 53 — AB
GLUCOSE: 101 mg/dL — AB (ref 65–99)
Osmolality: 275 (ref 275–301)
Potassium: 4.3 mmol/L (ref 3.5–5.1)
SGOT(AST): 18 U/L (ref 15–37)
SGPT (ALT): 18 U/L
Sodium: 137 mmol/L (ref 136–145)
TOTAL PROTEIN: 7.6 g/dL (ref 6.4–8.2)

## 2014-01-02 ENCOUNTER — Ambulatory Visit: Payer: Self-pay | Admitting: Oncology

## 2014-01-02 ENCOUNTER — Emergency Department: Payer: Self-pay | Admitting: Student

## 2014-01-02 LAB — PROTIME-INR
INR: 2.3
Prothrombin Time: 24.7 secs — ABNORMAL HIGH (ref 11.5–14.7)

## 2014-01-02 LAB — APTT: Activated PTT: 51.6 secs — ABNORMAL HIGH (ref 23.6–35.9)

## 2014-02-09 ENCOUNTER — Ambulatory Visit: Payer: Self-pay | Admitting: Oncology

## 2014-02-09 LAB — CBC CANCER CENTER
BASOS PCT: 2.1 %
Basophil #: 0.2 x10 3/mm — ABNORMAL HIGH (ref 0.0–0.1)
EOS ABS: 0.4 x10 3/mm (ref 0.0–0.7)
EOS PCT: 4 %
HCT: 30.5 % — ABNORMAL LOW (ref 35.0–47.0)
HGB: 9.4 g/dL — ABNORMAL LOW (ref 12.0–16.0)
LYMPHS PCT: 19.6 %
Lymphocyte #: 1.7 x10 3/mm (ref 1.0–3.6)
MCH: 25.2 pg — ABNORMAL LOW (ref 26.0–34.0)
MCHC: 30.9 g/dL — AB (ref 32.0–36.0)
MCV: 82 fL (ref 80–100)
MONO ABS: 1 x10 3/mm — AB (ref 0.2–0.9)
MONOS PCT: 10.9 %
NEUTROS ABS: 5.5 x10 3/mm (ref 1.4–6.5)
NEUTROS PCT: 63.4 %
PLATELETS: 288 x10 3/mm (ref 150–440)
RBC: 3.74 10*6/uL — ABNORMAL LOW (ref 3.80–5.20)
RDW: 16.6 % — ABNORMAL HIGH (ref 11.5–14.5)
WBC: 8.7 x10 3/mm (ref 3.6–11.0)

## 2014-03-04 ENCOUNTER — Ambulatory Visit: Payer: Self-pay | Admitting: Oncology

## 2014-03-10 DIAGNOSIS — I2699 Other pulmonary embolism without acute cor pulmonale: Secondary | ICD-10-CM | POA: Insufficient documentation

## 2014-03-10 DIAGNOSIS — F32A Depression, unspecified: Secondary | ICD-10-CM | POA: Insufficient documentation

## 2014-03-10 DIAGNOSIS — F329 Major depressive disorder, single episode, unspecified: Secondary | ICD-10-CM | POA: Insufficient documentation

## 2014-03-11 DIAGNOSIS — C50919 Malignant neoplasm of unspecified site of unspecified female breast: Secondary | ICD-10-CM | POA: Insufficient documentation

## 2014-03-11 DIAGNOSIS — M810 Age-related osteoporosis without current pathological fracture: Secondary | ICD-10-CM | POA: Insufficient documentation

## 2014-03-11 HISTORY — DX: Malignant neoplasm of unspecified site of unspecified female breast: C50.919

## 2014-05-05 ENCOUNTER — Ambulatory Visit
Admit: 2014-05-05 | Disposition: A | Discharge status: Home / Self Care | Payer: Self-pay | Attending: Oncology | Admitting: Oncology

## 2014-05-14 DIAGNOSIS — F32A Depression, unspecified: Secondary | ICD-10-CM | POA: Insufficient documentation

## 2014-05-14 DIAGNOSIS — E039 Hypothyroidism, unspecified: Secondary | ICD-10-CM | POA: Insufficient documentation

## 2014-05-14 DIAGNOSIS — I10 Essential (primary) hypertension: Secondary | ICD-10-CM | POA: Insufficient documentation

## 2014-05-14 DIAGNOSIS — F419 Anxiety disorder, unspecified: Secondary | ICD-10-CM

## 2014-05-14 DIAGNOSIS — F329 Major depressive disorder, single episode, unspecified: Secondary | ICD-10-CM | POA: Insufficient documentation

## 2014-05-23 DIAGNOSIS — M170 Bilateral primary osteoarthritis of knee: Secondary | ICD-10-CM | POA: Insufficient documentation

## 2014-06-03 ENCOUNTER — Ambulatory Visit
Admit: 2014-06-03 | Disposition: A | Discharge status: Home / Self Care | Payer: Self-pay | Attending: Oncology | Admitting: Oncology

## 2014-06-21 NOTE — Op Note (Signed)
PATIENT NAME:  Lauren Mccann, Lauren Mccann MR#:  [id number] DATE OF BIRTH:  12-27-1930  DATE OF PROCEDURE:  11/07/2011  PREOPERATIVE DIAGNOSES:  1. Submassive pulmonary embolus.  2. Residual right lower extremity DVT.  3. Worsening hypoxia on 6 liters nasal cannula with poor pulmonary reserve.  4. Fibromyalgia.   POSTOPERATIVE DIAGNOSES: 1. Submassive pulmonary embolus.  2. Residual right lower extremity DVT.  3. Worsening hypoxia on 6 liters nasal cannula with poor pulmonary reserve.  4. Fibromyalgia.   PROCEDURES:  1. Ultrasound guidance for vascular access, right femoral vein.  2. Catheter placement into inferior vena cava.  3. Inferior venacavogram.  4. Placement of a [employer] IVC filter.   SURGEON: Algernon Huxley, MD    ANESTHESIA: Local with 2 mg Versed.   ESTIMATED BLOOD LOSS: Minimal.   FLUOROSCOPY TIME: Less than one minute.   CONTRAST USED: 15 mL.   INDICATION FOR PROCEDURE: The patient is a 81 year old white female with a very large pulmonary embolus. She has residual chest pain. She has hypoxia even on 6 liters nasal cannula. We discussed options for potentially lysing her pulmonary embolus but she was not interested in this due to the risk of bleeding. She did, however, have very poor pulmonary reserve and she had a large volume residual right lower extremity DVT. For this reason, I thought that an IVC filter was a prudent procedure to try to prevent any further embolization which would be fatal. Risks and benefits were discussed. Informed consent was obtained.   DESCRIPTION OF PROCEDURE: The patient was brought to the Vascular Interventional Radiology Suite. Right groin was sterilely prepped and draped and a sterile surgical field was created. The right femoral vein itself was patent. As the DVT was in the superficial femoral vein and popliteal vein, it was accessed under direct ultrasound guidance without difficulty with a Seldinger needle and permanent image was recorded. A  J-wire was placed. After skin nick and dilatation, the delivery sheath was placed into the inferior vena cava and inferior venacavogram was performed. This demonstrated a patent vena cava with the left femoral vein being at L1 and we deployed the filter at L2. The delivery sheath was removed. Pressure was held.    ____________________________ Algernon Huxley, MD jsd:drc D: 11/07/2011 16:08:46 ET T: 11/08/2011 08:42:48 ET JOB#: [id number]  cc: Algernon Huxley, MD, <Dictator> Algernon Huxley MD ELECTRONICALLY SIGNED 11/10/2011 14:42

## 2014-06-21 NOTE — Discharge Summary (Signed)
PATIENT NAME:  Lauren Mccann, Lauren Mccann MR#:  [id number] DATE OF BIRTH:  1930/06/14  DATE OF ADMISSION:  11/06/2011 DATE OF DISCHARGE:  11/13/2011  PRIMARY CARE PHYSICIAN: Apolonio Schneiders, MD  DISCHARGE DIAGNOSIS: Pulmonary embolism and deep vein thrombosis status post IVC filter.   DISCHARGE CONDITION: Good.  HISTORY ON PRESENTATION: This is a 81 year old female with past medical history of bilateral breast cancer, fibromyalgia, chronic back pain, cerebrovascular accident, and she was a resident of senior living facility. She presented to the Emergency Room complaining of right-sided chest pain along with shortness of breath over the last two days. The pain was nonradiating and there were no aggravating or relieving factors and it was not relieved by nitroglycerin. In the Emergency Room d-dimer was done and it was elevated. CT of the chest was done and showed bilateral large pulmonary emboli and she was admitted for pulmonary emboli.   HOSPITAL COURSE:  1. Initially she was started on Lovenox therapeutic dose and she needed oxygen. She was continued on the medical floor with oxygen and Lovenox. Deep vein thrombosis study for any venous thrombus was also positive for occlusive thrombus in the right popliteal vein and new popliteal thrombus in the proximal superficial femoral vein. She got an IVC filter placed during her hospital stay and she continued to need oxygen via nasal cannula during the hospital stay. She finally had decreased her oxygen need up to 3 liters by nasal cannula and so she is being discharged with 3 liters nasal cannula oxygen. Her anticoagulation with Lovenox is discontinued and she was started on rivaroxaban which needs to be continued up to three weeks at dose of 15 mg two times a day and after that 20 mg once a day.  2. Other medical issues addressed during the hospital stay where she was found having urinary tract infection and she was treated with ceftriaxone IV during the hospital stay.  She finished the course and does not have any complaint about that.   3. Fibromyalgia. Her pain was managed with morphine and Percocet p.r.n. and she is being discharged with Percocet.  4. She was found having diastolic cardiac failure as per echocardiogram done during the hospital stay and she was started on furosemide and she is tolerating it well so she is being discharged with that.  5. Her other past medical problems like bilateral breast cancer and history of CVA without any residual weakness remained stable during the hospital stay and there was no need for any further management for those problems during the hospital stay.   DISCHARGE CONDITION: Satisfactory.   CODE STATUS: FULL CODE.  DRUG ALLERGIES: Prednisone, codeine, Levaquin, Benadryl, and Cymbalta.  DISCHARGE MEDICATIONS: 1. Nasonex 70 mg two sprays daily. 2. Gabapentin 100 mg oral tablet three times daily as needed.  3. Singulair 10 mg oral tablet once a day. 4. Tramadol 50 mg once a day.  5. Crestor 5 mg once a day. 6. Vitamin D 1000 mg once a day. 7. Alprazolam 1 mg three times daily. 8. Rivaroxaban 15 mg twice a day for three weeks, up to 12/02/2011, and then stop and start Rivaroxaban 20 mg once a day from 12/03/2011.  9. Zolpidem 5 mg oral tablet once a day at bedtime as needed for insomnia.  10. Diltiazem 30 mg oral tablet every six hours. 11. Furosemide 20 mg once a day.  12. Nexium 40 mg once a day. 13. Percocet 325/5 mg orally every six hours as needed for pain.  HOME OXYGEN: 3  liters oxygen via nasal cannula.   DIET: Low fat, low cholesterol, consistency regular.  ACTIVITY: As tolerated.       DISCHARGE FOLLOWUP: She can followup within 1 to 2 weeks with her primary family doctor, Dr. Apolonio Schneiders, of [facility], in [place], [place].  ____________________________ Ceasar Lund. Anselm Jungling, MD vgv:slb D: 11/13/2011 11:15:32 ET T: 11/13/2011 11:34:40 ET JOB#: [id number]  cc: Ceasar Lund.  Anselm Jungling, MD, <Dictator> Vianne Bulls. Arline Asp, MD Vaughan Basta MD ELECTRONICALLY SIGNED 11/24/2011 17:55

## 2014-06-21 NOTE — H&P (Signed)
PATIENT NAME:  Lauren Mccann, Lauren Mccann MR#:  [id number] DATE OF BIRTH:  [birth year]  DATE OF ADMISSION:  11/06/2011  PRIMARY CARE PHYSICIAN: Dr. Apolonio Schneiders   History obtained from the patient and her friend at bedside. Old records have been reviewed. CT scan and EKG have been personally reviewed and the case discussed with ER physician Dr. Jasmine December.   CHIEF COMPLAINT: Right-sided chest pain, diarrhea, nausea, and vomiting.   HISTORY OF PRESENT ILLNESS: This 81 year old female patient with history of bilateral breast carcinoma, fibromyalgia, chronic back pain, and cerebrovascular accident, resident of a senior living facility presents to the Emergency Room complaining of right-sided chest pain and diarrhea for one week. The patient mentions that her diarrhea is chronic. It is on and off. She did have an episode of vomiting in the Emergency Room. No abdominal pain. The patient has had right-sided chest pain along with shortness of breath over the last two days. This was nonradiating with no aggravating or relieving factors. Not relieved with nitroglycerin. The patient had a D-dimer done which was greater than 6, then had a CT scan of the chest, which showed bilateral pulmonary emboli, saddle on the right extending from the main right-sided pulmonary artery into three right-sided branches along with smaller left-sided pulmonary emboli. The patient's blood pressure and heart rate have been stable. She is saturating 92% on 2 liters oxygen at this time. No hemodynamic instability.  She has received a bolus dose of heparin and is being started on IV heparin at this time.   No history of recent hospitalization, flight journey, or long road trips. She has been mostly in bed over the last one week secondary to her diarrhea.   PAST MEDICAL HISTORY:  1. Bilateral breast carcinoma with resection five years back with no chemo or radiation and no residual cancer.  2. Cerebrovascular accident.  3. Chronic diarrhea.   4. Chronic back pain.  5. Fibromyalgia.   SOCIAL HISTORY: The patient does not smoke, drink alcohol, or use illicit drugs. Lives in a senior living facility. She has a sister in [place] who is sick. The patient is FULL CODE.  She does not have a DPOA.   FAMILY HISTORY: No family history of blood clots or coronary artery disease.   ALLERGIES: Benadryl, codeine, Cymbalta, Darvocet, Levaquin, and prednisone.   REVIEW OF THE SYSTEMS: CONSTITUTIONAL: Complains of fatigue and weakness. EYES: No blurred vision, pain, or redness. ENT: No tinnitus, ear pain, or hearing loss. RESPIRATORY: Complains of cough. No wheeze or hemoptysis. CARDIOVASCULAR: Complains of chest pain. No orthopnea or edema. GI: Complains of nausea, vomiting, and diarrhea, but no abdominal pain, hematemesis, or melena. GENITOURINARY: No dysuria, hematuria, or renal frequency. ENDOCRINE: No polyuria, nocturia, or thyroid problems. HEMATOLOGIC/LYMPHATIC: No anemia, easy bruising, or bleeding. SKIN: No rash or suspicious lesions. MUSCULOSKELETAL: Has chronic back pain. No other joint pain but has some mild arthritis. NEUROLOGIC: No focal neurological weakness or dysarthria. PSYCHIATRIC: No anxiety or depression.   HOME MEDICATIONS:  1. Alprazolam 1 mg oral 3 times a day.  2. Aspirin 81 mg oral once a day.  3. Crestor 5 mg oral once a day.  4. Gabapentin 100 mg oral 3 times a day.  5. Nasonex 17 mg, two sprays daily.  6. Nexium 40 mg once a day.  7. Os-Cal D 2 tablets once a day, 500 mg.  8. Singulair 10 mg oral once a day.  9. Tramadol 50 mg oral once a day.  10. Tylenol 500 mg as  needed every six hours.  11. Vitamin D 1000 mg 1 tablet once a day.  12. Zoloft 100 mg oral once a day.   PHYSICAL EXAMINATION:  Temperature 98, pulse 106, down to 84, respirations 18, blood pressure 131/69 and saturating 92% on 2 liters oxygen.   GENERAL: Elderly Caucasian female patient lying in bed in distress secondary to her chest pain. She is  alert and oriented times three. Mood and affect appropriate. Judgment intact.   HEENT: Atraumatic, normocephalic. Oral mucosa moist and pink. External ears and nose normal. No pallor. No icterus. Pupils bilaterally equal and reactive to light.   NECK: Supple. No thyromegaly. No palpable lymph nodes. Trachea midline. No carotid bruit or JVD.   CARDIOVASCULAR: S1, S2, tachycardia without any murmurs. Peripheral pulses 2+. No edema.   RESPIRATORY: Normal work of breathing. Clear to auscultation on both sides. Tenderness in the right side of the chest.   GASTROINTESTINAL: Soft abdomen, nontender. Bowel sounds present. No hepatosplenomegaly palpable.   GENITOURINARY: No CVA tenderness or bladder distention.   SKIN: Warm and dry. No petechiae, rash, or ulcers.   MUSCULOSKELETAL: No joint swelling, redness, or effusion of the large joints. Normal muscle tone.   NEUROLOGIC: Motor strength five out of five in the upper extremities. Sensation to fine touch intact all over. Cranial nerves II through XII intact.   LYMPHATIC: No cervical lymphadenopathy.   LABORATORY, DIAGNOSTIC, AND RADIOLOGICAL DATA: Laboratory studies show BUN 14, creatinine 0.99, GFR of 54, albumin 3.3. WBC 13.2, hemoglobin 13.3, platelets of 228. D-dimer greater than 6.   CT scan of the chest with contrast shows saddle pulmonary embolus on the right side with large pulmonary embolus within the right main pulmonary artery extending into segmental branches of the right upper lobe, middle lobe, and lower lobe. There are also pulmonary emboli in the left upper lobe and peripheral branches of the left lower lobe artery segment. No other acute abnormalities found.   EKG shows incomplete right bundle branch block and right axis deviation.   ASSESSMENT AND PLAN:  1. Large bilateral pulmonary emboli, right greater than left in the right main pulmonary artery. The patient's blood pressure and heart rate are stable at this time. She does  have some acute respiratory failure needing oxygen support. We will start on Lovenox therapeutic dose. We will also get a 2-D echocardiogram and venous Dopplers of the lower extremities. The patient will be on a telemetry-monitored floor. High risk for deterioration. This was explained to the patient. The patient is a FULL CODE at this time.  2. Diarrhea and nausea. We will check a C. difficile and stool WBC. The patient does mention that she has chronic diarrhea on and off. She could have some irritable bowel syndrome. We will need to rule out an infection at this time.  3. Dehydration. Start the patient on gentle IV hydration.  4. History of cerebrovascular accident. No residual deficits. The patient will be on a cardiac diet.  5. GI prophylaxis considering the patient is on anticoagulation.  6.  UTI- Start ceftriaxone. Could be causing nausea. 6. CODE STATUS: FULL CODE.    TIME SPENT: Time spent today on this case with saddle pulmonary embolism with high risk of deterioration being admitted on heparin products was 65 minutes with more than 50% of the time spent in coordination of care.   ____________________________ Leia Alf. Kuper Rennels, MD srs:bjt D: 11/06/2011 14:46:16 ET T: 11/06/2011 15:21:17 ET JOB#: [id number]  cc: Alveta Heimlich R. Xane Amsden, MD, <Dictator> Mitzi Hansen  Milagros Evener, MD Neita Carp MD ELECTRONICALLY SIGNED 11/06/2011 17:57

## 2014-06-21 NOTE — Consult Note (Signed)
Patient admitted with right sided chest pain, hypoxia, and leg pain.  Found to have significant PE and significant residual RLE DVT.  Hypoxia has actually gotten worse since admission, and is now on 6L  with adequate sats and not significantly tachypnic.  Discussed with her options for treatment.  Pulmonary lysis can be considered at this point due to worsening clinical status and this was offered.  She does not want to have this at this time due to bleeding and arrhythmia risks.  Discussed that if she worsens, this may be required more urgently.  Discussed placement of filter due to poor pulmonary reserve and residaul large volume DVT.  She is agreeable to this, and will do filter today.  Electronic Signatures: Algernon Huxley (MD)  (Signed on 05-Sep-13 13:47)  Authored  Last Updated: 05-Sep-13 13:47 by Algernon Huxley (MD)

## 2014-06-24 NOTE — Consult Note (Signed)
Brief Consult Note: Diagnosis: Symptomatic anemia, heme + stool.   Patient was seen by consultant.   Consult note dictated.   Discussed with Attending MD.   Comments: Patient seen and examined. She presents with symptomatic Microcytic anemia with an initial hgb of 6.0 with an MCV of 64. S/p 2 units of PRBCs her hgb is now 8.2. Vitals stable. Patient denies noticing any gross GI bleeding. She was heme positive in the ER. Agree with holding xarelto. CTM hgb closely. She has a history of IDA and last had an EGD/Colonoscopy in 01/2011 by Dr Candace Cruise notable for diverticulosis, gastritis (neg Hpylori), hiatal hernia. Capsule endoscopy was ordered to follow, no active bleeding noted however exam was incomplete and only visualized up to a portion of her small bowel. Likely will benefit from repeat EGD/colonoscopy. Can prep tomorrow.  Agree with stools studies for her hx of chronic diarrhea. will follow.  Case discussed with Dr Arther Dames and full consult is being dictated.  Electronic Signatures: Sherald Barge (PA-C)  (Signed 24-Jul-14 12:33)  Authored: Brief Consult Note   Last Updated: 24-Jul-14 12:33 by Sherald Barge (PA-C)

## 2014-06-24 NOTE — Discharge Summary (Signed)
PATIENT NAME:  Lauren Mccann, Lauren Mccann MR#:  [id number] DATE OF BIRTH:  20-Nov-1930  DATE OF ADMISSION:  11/09/2012 DATE OF DISCHARGE:  11/16/2012  DISCHARGE DIAGNOSIS: Intra-abdominal abscess.   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 She returned to the hospital on 11/09/2012 with a fever. She was experiencing some diarrhea at that time and she was admitted for evaluation. At the time of presentation, her examination was notable for diffuse muscle aches and moderate tenderness in the right upper quadrant. The remaining was exam was unremarkable. CT scan at that time showed evidence of fluid collection of the right upper quadrant below the right lobe of the liver measuring up to 3.7 cm in diameter. The patient was felt to have developed an intra-abdominal abscess and was admitted for evaluation. The patient underwent CT-guided drainage of the abscess on 11/10/2012 at which time 10 mL of purulent fluid was aspirated. The cavity collapsed and a drain was not necessary. Cultures subsequently showed evidence of E. coli, Enterococcus faecalis and Bacteroides ovatus. The patient was started on Zosyn IV 3.75 mg q. 8 hours and had prompt resolution of her fever and improvement in her abdominal symptoms. At this time, she is tolerating a soft diet reasonably well, has noticed an improvement in her stools from watery to pudding-like and has had normalization of her white blood cell count. At its peak, it was 18,900 and then this morning 9000 with a normal differential.   Electrolytes have been acceptable. She was noted to have a mildly depressed serum albumin of 2.1. Liver function studies are normal. Pro time is normal at 1.0.   PAST MEDICAL HISTORY: Is notable for a previous CVA, fibromyalgia and pulmonary embolism. The Xarelto prescribed after the pulmonary embolism was held when she was found to be  profoundly anemic thought secondary to the cecal tubulovillous adenoma. Plans are to reinstitute this now that the patient's bleeding source has been eliminated.   DISCHARGE MEDICATIONS: 1.  Zosyn 3.75 mg IV q. 8 hours for 5 additional days.  2.  Ferrous sulfate 325 mg b.i.d.  3.  Diltiazem 30 mg q. 12 hours. 4.  Cholestyramine light 4 gram packet daily. 5.  Alprazolam 0.5 mg daily. 6.  Zoloft 150 mg daily. 7.  Ultram 50 mg q. 4 hours p.r.n. for pain.   DISCHARGE INSTRUCTIONS: Arrangements are to be made for followup with Dr. Tamala Julian the week of September 22nd.   Short-term rehab to improve her strength, endurance and stability has been recommended and accepted by the patient.  ____________________________ Robert Bellow, MD jwb:sb D: 11/16/2012 13:04:38 ET T: 11/16/2012 13:36:01 ET JOB#: [id number]  cc: Robert Bellow, MD, <Dictator> J. Rochel Brome, MD Youlanda Roys. Lovie Macadamia, MD Shermika Balthaser Amedeo Kinsman MD ELECTRONICALLY SIGNED 11/16/2012 21:32

## 2014-06-24 NOTE — H&P (Signed)
PATIENT NAME:  Lauren Mccann, Lauren Mccann MR#:  [id number] DATE OF BIRTH:  05-Oct-1930  DATE OF ADMISSION:  09/23/2012  PRIMARY CARE PHYSICIAN: Apolonio Schneiders, MD  REQUESTING PHYSICIAN: Ferman Hamming, MD   CHIEF COMPLAINT: Fatigue, shortness of breath and nausea with low hemoglobin.   HISTORY OF PRESENT ILLNESS: The patient is an 81 year old female with a known history of PE, on Xarelto, anxiety and breast cancer is being admitted for severe anemia. The patient was diagnosed with PE on last admission in the hospital September 2013 when she was discharged on Xarelto. She was at [facility], was doing fine to begin with, but then slowly started getting worse, feeling more fatigued, short of breath and nauseous, which she blamed on her underlying fibromyalgia, but she continued to get worse. Finally, she called her primary care physician recently who requested her to get blood work at their office, found to have hemoglobin of 6.1 and requested her to come to the Emergency Department. While in the ED, she was found to have hemoglobin of 6.0 and is being admitted for further evaluation and management. Her Hemoccult stool was positive in the ED, and she denies any gross bleeding.   PAST MEDICAL HISTORY: 1.  Breast cancer, status post resection 6 years ago with no chemo or radiation.  2.  History of CVA.  3.  Chronic diarrhea. 4.  Chronic back pain.  5.  Fibromyalgia. 6.  History of PE, on Xarelto.   SOCIAL HISTORY: No smoking, alcohol or illicit drugs.   FAMILY HISTORY: Positive for bone cancer in the father.   ALLERGIES:  TO BENADRYL, CODEINE, CYMBALTA, DARVOCET, LEVAQUIN AND PREDNISONE.     MEDICATIONS AT HOME:  1.  Alprazolam 1 mg 1/2 tablet p.o. 3 times a day as needed.  2.  Crestor 5 mg p.o. at bedtime.  3.  Cardizem 30 mg p.o. every 6 hours.  4.  Lasix 20 mg p.o. daily. 5.  Gabapentin 100 mg 2 capsules p.o. 3 times a day. 6.  Lomotil 2 tablets p.o. after each loose stool.  7.  Xarelto 20 mg  p.o. daily since October this has been started.  8.  Tramadol 50 mg p.o. every 6 hours as needed.    REVIEW OF SYSTEMS:  CONSTITUTIONAL: No fever. Positive fatigue and weakness.  EYES: No blurred or double vision.  EARS, NOSE AND THROAT: No tinnitus or ear pain.  RESPIRATORY: No cough, wheezing, hemoptysis. Positive for shortness of breath.  CARDIOVASCULAR: No chest pain, orthopnea or edema.  GASTROINTESTINAL: Positive for nausea. No vomiting. She does have history of chronic diarrhea.  GENITOURINARY: No dysuria or hematuria.  ENDOCRINE: No polyuria or nocturia.  HEMATOLOGY: Positive for severe anemia.  No easy bruising or bleeding.  SKIN: No rash or lesion.  MUSCULOSKELETAL: No arthritis or muscle cramp. She does have history of chronic back pain.  NEUROLOGIC: No tingling or numbness. Positive for generalized weakness.  PSYCHIATRY: Positive for history of anxiety.   PHYSICAL EXAMINATION: VITAL SIGNS: Temperature 98.7, heart rate 104 per minute, respiration 18 per minute, blood pressure 117/71 mmHg.  She is saturating 97% on room air.  GENERAL: The patient is an 81 year old female lying in the bed comfortably without any acute distress.  HEENT: Eyes:  Pupils are round, equal, reactive to light and accommodation.  No scleral icterus.  Extraocular muscles are intact. Pallor present in the eyes. Head: Atraumatic, normocephalic. Oropharynx and nasopharynx are clear. NECK:  Supple.  No jugular venous distention.  No thyroid enlargement or  tenderness.  LUNGS: Clear to auscultation bilaterally. No rhonchi, rales or crepitations.  CARDIOVASCULAR: S1, S2 normal.  No murmurs, rubs or gallops.   ABDOMEN: Soft, nontender, nondistended.  Bowel sounds present. No organomegaly or mass.  EXTREMITIES: No pedal edema, cyanosis or clubbing.  NEUROLOGICAL: Nonfocal examination. Cranial nerves II through XII are intact. Muscle strength 5 out of 5 in all extremities.  Sensation intact.   PSYCHIATRIC:  The  patient is alert and oriented x 3. SKIN:  No obvious rash, lesion or ulcer.    LABORATORY AND RADIOLOGICAL DATA:  Normal BMP except sodium 133.  Normal liver function tests. Normal first set of cardiac enzymes.  CBC showed white count 10.7, hemoglobin 6.0, hematocrit 20.3, platelets 454. Hemoccult stool was positive in the ED.  EKG shows normal sinus rhythm with sinus arrhythmias.  No major ST-T changes.   IMPRESSION AND PLAN: 1.  Severe anemia, likely due to her being on Xarelto:  Her Hemoccult was positive in the ED. We will consult GI. Two packed red blood cells transfusion ordered by the ED physician.  We will recheck in the morning.  There is no gross bleeding. We will hold Xarelto at this time and hold off any other anticoagulants considering her GI bleed.  2.  History of PE on Xarelto, stopped now:  Consider stopping this after discussion with her primary care physician.  Can also discuss with Pulmonary to make sure they are okay with this as this has been greater than 6 months.  3.  History of chronic diastolic heart failure, well compensated at this time:  We will hold off Lasix considering her GI bleed. She may need some fluid.  She does seem a little bit dehydrated, also, with her sodium being low.   4.  Anxiety:  We will continue Xanax as needed.  5.  Hyperlipidemia: We will continue Crestor. 6.  Neuropathy:  We will continue home meds at this time.  7.  Prophylaxis:  She does not  anything meds  for DVT prophylaxis as she is already on Xarelto, now with GI bleed. She cannot use any anticoagulant.  We will order SCDs and TEDs.   8.  For stress ulcer prophylaxis, she will be on Protonix twice a day at this time.        TIME TAKEN: Total time taking care of this patient is 55 minutes.  ____________________________ Lucina Mellow. Manuella Ghazi, MD vss:cb D: 09/23/2012 17:13:59 ET T: 09/23/2012 17:39:09 ET JOB#: [id number]  cc: Rayaan Garguilo S. Manuella Ghazi, MD, <Dictator> Vianne Bulls. Arline Asp, MD Lucina Mellow [facility]  MD ELECTRONICALLY SIGNED 09/25/2012 11:02

## 2014-06-24 NOTE — Consult Note (Signed)
PATIENT NAME:  Lauren Mccann, Lauren Mccann MR#:  [id number] DATE OF BIRTH:  November 11, 1930  GASTROENTEROLOGY CONSULTATION REPORT  Dictated for attending gastroenterologist, Dr. Kathrin Ruddy.  DATE OF CONSULTATION:  09/24/2012  REFERRING PHYSICIAN:  Dr. Manuella Ghazi.  REASON FOR CONSULT: Anemia.   HISTORY OF PRESENT ILLNESS: This is a pleasant 81 year old female who initially presented to the hospital with concerns of excessive fatigue, shortness of breath, and dizziness. She was found to have a markedly low hemoglobin with an initial level of 6.0 and an MCV of 64. She was admitted, started on IV Protonix b.i.d. 40 mg, and was transfused 2 units of packed red blood cells. Following her transfusion her hemoglobin did increase to 8.2. The patient states that she has not noticed any gross GI bleeding. There is no bright red blood per rectum or melena. She does have a history of chronic diarrhea, for which she states there is associated looseness and urgency which has been going on for several years, and this has not changed recently. Other than that there is no associated abdominal pain, vomiting or dysphagia. There is persistent daily nausea since the onset of these symptoms. Of note, she does have a history of a pulmonary embolism and does take Xarelto at home. Since being admitted this has been held. Also because of her chronic diarrhea there are stool studies that have been submitted and are currently pending.   No unintentional weight changes. No changes to her appetite. No chest pain or shortness of breath currently. No fever or chills.   PAST MEDICAL HISTORY: Pulmonary embolism, history of a CVA, history of breast cancer currently in remission, fibromyalgia, diastolic heart failure that is well-compensated, and history of chronic diarrhea.   FAMILY HISTORY: Father had lung cancer. No other known family history of GI malignancy, colon polyps, or IBD.   SOCIAL HISTORY: The patient denies any alcohol, tobacco or  illicit drug use.   ALLERGIES: LEVAQUIN, CODEINE, CYMBALTA, DARVOCET, BENADRYL AND PREDNISONE.   HOME MEDICATIONS: Aspirin 325 mg, alprazolam, Crestor, Cardizem, Lasix, Lomotil Xarelto, tramadol and gabapentin.   REVIEW OF SYSTEMS: A 10-systems review of systems was obtained on the patient. Pertinent positives are mentioned above, and otherwise negative.   OBJECTIVE: VITAL SIGNS: Blood pressure 125/70, heart rate 89, respirations 18, temp 97.6 pulse oximetry is 97%.  GENERAL: This is a pleasant 81 year old female resting quietly and comfortably in the exam room, in no acute distress. Alert and oriented x 3.  HEAD: Atraumatic, normocephalic.  NECK: Supple. No lymphadenopathy noted.  HEENT: Sclerae anicteric. Mucous membranes moist.  PULMONARY: Respirations are even and unlabored, clear to auscultation bilaterally in the anterior lung fields.  CARDIAC: Regular rate and rhythm. S1, S2 noted.  ABDOMEN: Soft, nontender, nondistended. Normoactive bowel sounds noted in all 4 quadrants. No masses palpated. No guarding or rebound.  RECTAL: Deferred. Rectal exam in the ER was heme-positive.  PSYCHIATRIC: Appropriate mood and affect.  EXTREMITIES: Negative for lower extremity edema; 2+ pulses noted bilaterally.   LABORATORY DATA: White blood cells 6.7, hemoglobin 8.2, up from 6.0, hematocrit  25.1, platelets 342, MCV 70, up from 64.   Sodium 139, potassium 3.9, BUN 10, creatinine 0.93, glucose 91. Urinalysis negative. Troponins negative. Bilirubin 0.3, alkaline phosphatase 83, AST 18, ALT 13.   ASSESSMENT: 1.  Symptomatic anemia.  2.  Heme-positive stool.  3.  Nausea.  4. History of pulmonary embolism, currently on Xarelto; since being admitted this medication has been held.  5.  History of chronic diarrhea.   PLAN:  I have discussed this patient's case in detail with Dr. Kathrin Ruddy who is involved in the development of the patient's plan of care. We did have a long discussion with the patient  regarding her symptomatic anemia and the potential differential diagnoses. When looking at her medical record she does have a history of iron-deficiency anemia for which she underwent an EGD and colonoscopy in November 2012 by Dr. Candace Cruise. Findings were consistent with some mild esophagitis, hiatal hernia, gastritis, and diverticulosis, but no evidence of active bleeding and negative pathology for H. pylori.   She subsequently underwent a capsule endoscopy that was incomplete, and was only able to visualize up to a portion of the small bowel, but the visualized portion was unremarkable. Because of her markedly low hemoglobin level, however, and her heme-positive stool she will likely benefit from a repeat EGD and colonoscopy during this hospitalization. In the meantime we do recommend continuing Protonix 40 mg IV b.i.d. We will await the stool studies in regards to her chronic diarrhea.   We do recommend continuing to hold blood thinners and keeping a close eye on her hemoglobin and being prepared to transfuse as necessary. We will continue to monitor this patient throughout hospitalization and make further recommendations pending above and per clinical course.   Thank you so much for this consultation and for allowing [place] to participate in the patient's plan of care.   Attending gastroenterologist: Kathrin Ruddy.     ____________________________ Corky Sox. Jennah Satchell, PA-C kme:dm D: 09/24/2012 13:45:39 ET T: 09/24/2012 15:26:53 ET JOB#: [id number]  cc: Corky Sox. Cassadee Vanzandt, PA-C, <Dictator> [place] PA ELECTRONICALLY SIGNED 09/25/2012 12:41

## 2014-06-24 NOTE — Consult Note (Signed)
PATIENT NAME:  Lauren Mccann, Lauren Mccann MR#:  [id number] DATE OF BIRTH:  1930-11-25  DATE OF CONSULTATION:  09/26/2012  CONSULTING PHYSICIAN:  Loreli Dollar, MD  HISTORY OF PRESENT ILLNESS: This 81 year old female came into the hospital emergently with complaint of weakness and has had some nausea, decreased activity tolerance. Also had some mild degree of shortness of breath. Was initially evaluated and admitted. Was found to have a profound anemia with hemoglobin of 6 and MCV of 64. She was given 2 blood transfusions and says she feels better, breathing easier. She also has had colonoscopy yesterday done by Dr. Rayann Heman, with findings of a mass of the cecum which appeared suspicious for cancer. She also had upper endoscopy with findings of hiatus hernia, but there was no gastric or duodenal ulcer. Surgery consult was arranged now with a chief complaint of a tumor in her colon.   She had had colonoscopy in 2012, which the cecum was visualized, and there was no tumor at that time. Did have findings of diverticulosis. I have reviewed that note as well.   Recently, has had some nausea, somewhat decreased oral intake, but no vomiting. No significant abdominal discomfort. Does have chronic diarrhea and fecal incontinence, also urinary incontinence, but had not noticed any rectal bleeding and no history of any melenic stools. No recent chills or fever.   PAST MEDICAL HISTORY:  1. She has had a stroke a number years ago which caused some left facial weakness which lasted about 2 days and then resolved. Also, she had some mild slurred speech which resolved, and there has been no recurrence of stroke. She had evaluation at that time with no apparent cause. 2. She does have history of chronic diarrhea, which has been going on for a number of years, occasionally takes Imodium if she is going to travel outside her home.  3. She has a history of fibromyalgia with diffuse body aches and pains, chronic low back pain. 4. She  had a pulmonary embolism in September 2013. Relates that to site of origin in her legs. Has been treated with Xarelto since then and also has vena cava filter.  5. History chronic anxiety.  PAST SURGICAL HISTORY:  1. As noted, the vena cava filter.  2. Has had vaginal hysterectomy.  3. Has had exploratory laparotomy with cholecystectomy and appendectomy, which the cause of her illness at that time was gallbladder disease, but had incidental appendectomy.  4. Has had a right mastectomy for cancer in 1995. 5. Has had a left mastectomy for cancer in 2009. 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 Did not require chemotherapy.  6. Excision of cataract of left eye.   SOCIAL HISTORY: She lives at the [facility] in independent living. Does fix her own meals. Does not smoke. Does not drink any alcohol. Has recently done a limited amount of walking; however, as recent as a month ago, was doing some walking for exercise.   FAMILY HISTORY: Negative for colon cancer. Positive for bone cancer. Positive for melanoma. Positive for heart disease.   MEDICATIONS:  1. Xarelto 20 mg daily, which was discontinued on admission.  2. Cardizem 30 mg q.6 hours.  3. Lasix 20 mg daily.  4. Crestor 5 mg at bedtime.  5. Alprazolam 0.5 mg 3 times a day as needed.  6. Gabapentin 200 mg 3 times a day. 7. Imodium p.r.n. for diarrhea, not taken recently.  8. Tramadol 50 mg q.6 hours p.r.n. for  pain.  9. Aspirin taken daily.   DRUG ALLERGIES: BENADRYL, CODEINE, CYMBALTA, DARVOCET, LEVAQUIN, PREDNISONE.   REVIEW OF SYSTEMS: She reports she can see satisfactorily with either eyes. She has had no recent new visual problems. No recent cough, cold, sore throat. Did have some mild degree of dyspnea on admission. Did have the extreme fatigue. Does have chronic diffuse muscle aches including backache. She reports no exertional chest pains. She reports that she does have fecal and  urinary incontinence. She reports no recent leg edema. She reports no recent sores or boils. Her review of systems was otherwise negative.   PHYSICAL EXAMINATION:  GENERAL: She was seen in the hospital in her hospital bed.  VITAL SIGNS: Temperature is 98.2, pulse 85, respirations 20, blood pressure 143/86, pulse 94 on room air.  SKIN: Pale, without rash.  HEENT: Pupils equally reactive to light. Appears to have a cataract on the right. Palpebral conjunctivae very pale. Pharynx clear.  NECK: No palpable mass.  LUNGS: Lung sounds were clear.  HEART: Regular rhythm, S1 and S2.  ABDOMEN: Obese and soft. There is minimal degree of left lower quadrant tenderness. No guarding. No palpable mass. No hepatomegaly.  EXTREMITIES: With no dependent edema.  NEUROLOGIC: Awake, alert and oriented, moving all extremities.   CLINICAL DATA: As noted, her hemoglobin on admission was 6, and after 2 transfusions, her hemoglobin yesterday was 8.5. MCV on admission was 64. Her liver panel normal. Her creatinine 0.93. Her stool comprehensive culture was negative. Urine culture was greater than 100,000 colonies of E. coli.   Upper endoscopy and colonoscopy notes reviewed as noted above.   IMPRESSION:  1. Severe microcytic anemia, likely iron deficient. 2. 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  3. Urinary tract infection.   RECOMMENDATIONS: I discussed this with the patient. Recommended that we anticipate a laparoscopic right colectomy. I have discussed the operation, care, risks and benefits with her.   I also recommended that we try to build up her hemoglobin before surgery. I discussed having her discharge and go on iron therapy and will see her in my office in approximately 10 to 12 days to recheck her hemoglobin. It would be best to see if the hemoglobin comes up to above 10 before surgery. I also discussed potential role for a CT scan of the abdomen and pelvis for staging. She has received  a dose of Rocephin, which will likely resolve her urinary infection.   I also discussed resuming walking as best as tolerated.   I also discussed discharge plans with Dr. Leslye Peer.   I did discuss cessation of aspirin and Xarelto until after surgery.    ____________________________ Lenna Sciara. Rochel Brome, MD jws:OSi D: 09/26/2012 12:55:44 ET T: 09/26/2012 13:11:49 ET JOB#: [id number]  cc: Loreli Dollar, MD, <Dictator> Loreli Dollar MD ELECTRONICALLY SIGNED 09/27/2012 11:24

## 2014-06-24 NOTE — Consult Note (Signed)
I personally saw and examined the patient.  Agree with Tobe Sos dictated note.    Ms. Sebek presented with severe weakness and fatigue and was found to have a Hgb of 7 with MCV of 64.  She has responded approp to prbc.  No overt GI bleeding whatsoever.   She does report diarrhea for past several years at least 3 - 4 times per day that has been uninvestigated.    EGD/Colon in 2012 for anemia found no source for bleeding. Capsule done but did not get all the way through small bowel, no source of bleed on small bowel part visualized by capusle.   Given the need to restart anti-coag for her PE, the diarrhea, and the severe anemia, I have recommended she undergo another EGD and colonoscopy since it has been two years since the previous studies.  She is very worried about undergoiing the prep and needs more time to decide if she is willing to prep again  We tentatively have her planned for EGD/Colon for 7/25 afternoon  with miralax ( half tonight and half in am) if she decides to prep and undergo the procedures. If she decides against the procedures, would restart her anti-coag and monitor her Hgb closely as an outpatient.   Futher recs pending EGD/Colon.   Electronic Signatures: Arther Dames (MD)  (Signed on 24-Jul-14 17:22)  Authored  Last Updated: 24-Jul-14 17:22 by Arther Dames (MD)

## 2014-06-24 NOTE — Consult Note (Signed)
Chief Complaint:  Subjective/Chief Complaint Covering for Dr. Rayann Heman. Now with ascending colon mass. Chronic diarrhea. No bleeding. Min abd pain. Hgb stable now.   VITAL SIGNS/ANCILLARY NOTES: **Vital Signs.:   26-Jul-14 05:49  Vital Signs Type Recheck  Pulse Ox % Pulse Ox % 93  Pulse Ox Activity Level  At rest  Oxygen Delivery Room Air/ 21 %   Brief Assessment:  GEN well developed   Respiratory clear BS   Gastrointestinal Normal   Lab Results: Routine Micro:  25-Jul-14 05:39   Micro Text Report WBCS, STOOL   COMMENT                   NO RBC'S OR WBC'S SEEN   ANTIBIOTIC                       Comment .1. NO RBC'S OR WBC'S SEEN  Result(s) reported on 25 Sep 2012 at 02:37PM.  Routine Sero:  25-Jul-14 05:39   Occult Blood, Feces NEGATIVE (Result(s) reported on 25 Sep 2012 at 07:12AM.)  Routine Hem:  25-Jul-14 05:46   WBC (CBC) 8.0  RBC (CBC)  3.76  Hemoglobin (CBC)  8.5  Hematocrit (CBC)  26.2  Platelet Count (CBC) 364  MCV  70  MCH  22.5  MCHC 32.3  RDW  23.1  Neutrophil % 51.1  Lymphocyte % 28.0  Monocyte % 13.2  Eosinophil % 6.6  Basophil % 1.1  Neutrophil # 4.1  Lymphocyte # 2.2  Monocyte #  1.1  Eosinophil # 0.5  Basophil # 0.1 (Result(s) reported on 25 Sep 2012 at 06:47AM.)   Assessment/Plan:  Assessment/Plan:  Assessment Colon mass. Likely malignant. Await path. Reviewed colon report from 11/12. No mass seen in any of the pictures taken from right side of colon.   Plan Make sure surgery sees patient today. Ok for discharge if surgery wants to wait for final path and plan surgery later. Thanks.   Electronic Signatures: Verdie Shire (MD)  (Signed 26-Jul-14 10:22)  Authored: Chief Complaint, VITAL SIGNS/ANCILLARY NOTES, Brief Assessment, Lab Results, Assessment/Plan   Last Updated: 26-Jul-14 10:22 by Verdie Shire (MD)

## 2014-06-24 NOTE — Discharge Summary (Signed)
PATIENT NAME:  Lauren Mccann, Lauren Mccann MR#:  [id number] DATE OF BIRTH:  03-26-1930  DATE OF ADMISSION:  09/23/2012 DATE OF DISCHARGE:  09/26/2012  PRIMARY CARE PHYSICIAN:  Dr. Apolonio Schneiders.   FINAL DIAGNOSES: 1.  Severe anemia.  2.  Colon mass.  3.  History of pulmonary embolism.  4.  History of chronic diastolic congestive heart failure, which was not an acute issue on this hospitalization.  5.  Anxiety.  6.  Escherichia coli urinary tract infection.  7.  Hyperlipidemia.   MEDICATIONS ON DISCHARGE: Include diltiazem 30 mg every 6 hours, Lasix 20 mg daily, Crestor 5 mg at bedtime, gabapentin 100 mg 2 capsules 3 times a day, Xanax 1 mg 0.5 mg 3 times a day as needed for anxiety, tramadol 50 mg every 6 hours as needed for pain, ferrous sulfate 325 mg twice a day with meals, cephalexin 500 mg every 8 hours.   DIET: Low sodium diet, regular consistency.   ACTIVITY: As tolerated.   FOLLOW-UP:  With Dr. Rochel Brome, surgery in two weeks, 1 to 2 weeks with Dr. Apolonio Schneiders.   HOSPITAL COURSE: The patient was admitted 09/23/2012, discharged 09/26/2012. Came in with fatigue, shortness of breath and nausea and found to have a low hemoglobin.  The patient was admitted with severe anemia. The patient was on Xarelto for pulmonary embolism. This was held. The patient was given 2 units of packed red blood cells.    LABORATORY AND RADIOLOGICAL DATA DURING THE HOSPITAL COURSE:  Included a troponin that was negative. Glucose 102, BUN 15, creatinine 1.19, sodium 133, potassium 3.6, chloride 101, CO2 26, calcium 8.7. Liver function tests normal range. White blood cell count 10.7, hemoglobin and hematocrit 6.0 and 20.3, platelet count of 454. EKG showed a normal sinus rhythm with sinus arrhythmia. Hemoglobin improved to 8.2.  Occult blood was negative, C. difficile negative, stool comprehensive negative. Urine culture grew out greater than 100,000 E. coli, white blood cells in the stool negative. Hemoglobin prior to  discharge was 8.5.   PROCEDURES DURING THE HOSPITAL COURSE: Included an upper endoscopy that showed a hiatal hernia, otherwise normal. A colonoscopy showed malignant tumor in the proximal ascending colon which was biopsied and tattooed and also internal hemorrhoids.   HOSPITAL COURSE PER PROBLEM LIST:  1.  For the patient's severe anemia, the patient was transfused 2 units of packed red blood cells. The patient did have a GI work-up which showed a colon mass. Pathology is still pending at this time. We are unable to continue the patient's Xarelto and aspirin at this point in time I did give the patient dose a dose of IV iron Venofer 100 mg x1 and ferrous sulfate will be prescribed at discharge.  2.  Colon mass likely colon cancer, awaiting pathology results. Seen in consultation by Dr. Rochel Brome, surgery. This was seen on colonoscopy. Dr. Rochel Brome would like the patient's hemoglobin higher prior to surgery. He will follow up as an outpatient and consider doing a CT scan of the abdomen and pelvis as outpatient and setting up elective surgery.  3.  History of pulmonary embolism. The patient did have an IVC filter and it has been greater than 6 months since the diagnosis. Stop all anticoagulation at this time.  4.  History of chronic diastolic congestive heart failure. Not an issue on this hospitalization. The patient was receiving IV fluids during the entire hospitalization.  5.  Anxiety, on chronic medication.  6.  E. coli UTI.  Was  given ceftriaxone here. We will flip over to Keflex upon discharge.  7.  Hyperlipidemia: On Crestor.  Time spent on discharge: 35 minutes.   ____________________________ Tana Conch. Leslye Peer, MD rjw:dp D: 09/26/2012 15:31:11 ET T: 09/26/2012 16:52:04 ET JOB#: [id number]  cc: Tana Conch. Leslye Peer, MD, <Dictator> Vianne Bulls. Arline Asp, MD Marisue Brooklyn MD ELECTRONICALLY SIGNED 09/28/2012 14:58

## 2014-06-24 NOTE — Op Note (Signed)
PATIENT NAME:  Lauren Mccann, Lauren Mccann MR#:  [id number] DATE OF BIRTH:  [birth year]  DATE OF PROCEDURE:  11/13/2012  PREOPERATIVE DIAGNOSIS: Intraabdominal abscesses.  POSTOPERATIVE DIAGNOSIS: Intraabdominal abscesses.  SURGEON: Algernon Huxley, M.D.   ANESTHESIA: Local.   ESTIMATED BLOOD LOSS: Minimal.   PROCEDURES:  1. Ultrasound guidance for vascular access to left basilic vein.  2. Fluoroscopic guidance for placement of catheter.  3. Insertion of peripherally inserted central venous catheter, left arm.  INDICATION FOR PROCEDURE: An 81 year old female with intraabdominal abscesses who needs a PICC line for extended IV antibiotic therapy.   DESCRIPTION OF PROCEDURE: The patient's left arm was sterilely prepped and draped, and a sterile surgical field was created. The left basilic vein was accessed under direct ultrasound guidance without difficulty with a micropuncture needle and permanent image was recorded. 0.018 wire was then placed into the superior vena cava. Peel-away sheath was placed over the wire. A single lumen peripherally inserted central venous catheter was then placed over the wire and the wire and peel-away sheath were removed. The catheter tip was placed into the superior vena cava and was secured at the skin at 40 cm with a sterile dressing. The catheter withdrew blood well and flushed easily with heparinized saline. The patient tolerated procedure well.    ____________________________ Algernon Huxley, MD jsd:dm D: 11/16/2012 11:07:00 ET T: 11/16/2012 11:51:22 ET JOB#: [id number]  cc: Algernon Huxley, MD, <Dictator> Algernon Huxley MD ELECTRONICALLY SIGNED 11/25/2012 11:38

## 2014-06-24 NOTE — H&P (Signed)
Subjective/Chief Complaint Abd pain   History of Present Illness Pt is s/p right hemicolectomy complicated by an absces in ruq area. This was draine percutaneously via CT guidance and resolved. Pt was dischsrged to [facility] 2 days ago. Over last 2 days she has noted recurring abd pains stating in upper abdomen and moving in waves all over her abdomen. Nauseated  and vomited once after CT contrast here this pm. Bowels have not moved last 2 days.   Past History Se details in recent records.   Past Med/Surgical Hx:  Pulmonary Embolus:   anxiety:   Fibromyalgia:   allergies:   mastectomy: right  simple mastectomy left:   Cholecystectomy:   Back surgery:   Hysterectomy:   ALLERGIES:  Prednisone: Chest Pain, SOB  Codeine: Chest Tightness, SOB, Tachycardia  Darvocet - N: SOB  Levaquin: Alt Ment Status  Benadryl: Other  Cymbalta: Unknown  HOME MEDICATIONS: Medication Instructions Status  ALPRAZolam 0.5 mg oral tablet 1 tab(s) orally 3 times a day Active  diltiazem 30 mg oral tablet 1 tab(s) orally 2 times a day Active  cholestyramine 4 gram(s) orally once a day Active  acetaminophen 325 mg oral tablet 1 tab(s) orally every 4 hours, As needed, pain or temp. greater than 100.4 Active  ferrous sulfate 325 mg (65 mg elemental iron) oral tablet 1 tab(s) orally 2 times a day (with meals) Active  Crestor 5 mg oral tablet 1 tab(s) orally once a day (at bedtime) Active  gabapentin 100 mg oral capsule 2 cap(s) orally 3 times a day Active  alprazolam 1 mg oral tablet 0.5 tab(s) orally 2 times a day, As Needed - for Anxiety, Nervousness Active  furosemide 20 mg oral tablet 1 tab(s) orally once a day (in the morning) Active  tramadol 50 mg oral tablet 1 tab(s) orally every 4 hours, As Needed - for Pain  Active  Nasonex 50 mcg/inh nasal spray 2 sprays each nostril once a day, As Needed for allergies Active  Xarelto 20 mg oral tablet 1 tab(s) orally once a day (in the evening) Active  NexIUM  40 mg oral delayed release capsule 1 cap(s) orally once a day (in the morning) Active  Probiotic Formula - oral capsule 1 cap(s) orally once a day Active  ondansetron 8 mg oral tablet 1 tab(s) orally every 8 hours, As Needed - for Nausea, Vomiting Active   Review of Systems:  Fever/Chills No   Cough No   Sputum No   Abdominal Pain Yes   Diarrhea No   Constipation No   Nausea/Vomiting Yes   Physical Exam:  GEN well developed, well nourished, weak appearing   HEENT pink conjunctivae, dry oral mucosa   NECK supple  No masses   RESP clear BS   CARD regular rate  no murmur   ABD positive tenderness  distended  hypoactive BS   LYMPH negative neck   EXTR positive cyanosis/clubbing, negative edema   SKIN normal to palpation, No rashes   PSYCH A+O to time, place, person   Lab Results: Routine Chem:  17-Sep-14 16:33   Glucose, Serum  105  BUN 7  Creatinine (comp) 1.03  Sodium, Serum  135  Potassium, Serum 4.2  Chloride, Serum 104  CO2, Serum 24  Calcium (Total), Serum 9.5  Osmolality (calc) 268  eGFR (African American)  59  eGFR (Non-African American)  51 (eGFR values <101mL/min/1.73 m2 may be an indication of chronic kidney disease (CKD). Calculated eGFR is useful in patients with  stable renal function. The eGFR calculation will not be reliable in acutely ill patients when serum creatinine is changing rapidly. It is not useful in  patients on dialysis. The eGFR calculation may not be applicable to patients at the low and high extremes of body sizes, pregnant women, and vegetarians.)  Anion Gap 7   Radiology Results: CT:    17-Sep-14 19:22, CT Abdomen and Pelvis With Contrast  CT Abdomen and Pelvis With Contrast  REASON FOR EXAM:    (1) RLQ pain s/p R hemicolectomy and CT drainage of   post-op abscess; (2) see abo  COMMENTS:       PROCEDURE: CT  - CT ABDOMEN / PELVIS  W  - Nov 18 2012  7:22PM     RESULT: CT of the abdomen and pelvis is performed with 85  mL of  Isovue-300 iodinated intravenous contrast and oral contrast with images   reconstructed at 3 mm slice thickness in the axial plane. Comparison is   made to the previous exam of 11/09/2012.    The patient appears to be status post right hemicolectomy with   anastomosis in the right upper quadrant. There is small bowel distention   that appears to be extending to the liver with small bowel just lateral   to the area of anastomosis visualized on image 63 with a transition     suggested between image63 and image 69. Inferior vena cava filter is   present. Sigmoid colon diverticulosis is present. The kidneys enhance   normally without obstruction. The liver, spleen, pancreas and adrenal   glands appear unremarkable. The abdominal aorta is normal.There is   minimal ascites in the pelvis. There is no definite abscess. No   adenopathy is evident. There is a moderately large hiatal hernia present.   The gallbladder may have been removed. The lung bases show some minimal   fibrosis and/or atelectasis. There appears to be some fluid adjacent to   loops of distended small bowel in the left mid abdomen. No pneumatosis is   evident.    IMPRESSION:   1. Mechanical small bowel obstruction in the right upper quadrant   adjacent to the lateral aspectof the anastomosis. Surgical consultation   is recommended.  2. Hiatal hernia.  3. Sigmoid colon diverticulosis.  4. Scattered ascites.    Dictation Site: 6        Verified By: Sundra Aland, M.D., MD    Assessment/Admission Diagnosis small bowel obstruction   Plan NGT, IVF.  Hopefully  resolve with conservative management. May need surgical correction if no improvement in 2-3 days. Discussed fully with patient. Dr Laurene Footman from [facility] service will follow patient starting tomorrow am. Dr. Lynnell Chad surgeon- will be back on 11/23/12.   Electronic Signatures: Christene Lye (MD)  (Signed 17-Sep-14 21:57)  Authored: CHIEF  COMPLAINT and HISTORY, PAST MEDICAL/SURGIAL HISTORY, ALLERGIES, HOME MEDICATIONS, REVIEW OF SYSTEMS, PHYSICAL EXAM, LABS, Radiology, ASSESSMENT AND PLAN   Last Updated: 17-Sep-14 21:57 by Christene Lye (MD)

## 2014-06-24 NOTE — Op Note (Signed)
PATIENT NAME:  Lauren Mccann, ROHRER MR#:  [id number] DATE OF BIRTH:  [birth year]  DATE OF PROCEDURE:  10/29/2012  PREOPERATIVE DIAGNOSES: Tubulovillous adenoma of the cecum, sigmoid diverticulosis,  cystitis.  POSTOPERATIVE DIAGNOSES: Tubulovillous adenoma of the cecum, sigmoid diverticulosis,  Cystitis, intra-abdominal adhesions.   PROCEDURE: Laparoscopic right colectomy, laparoscopic hand-assisted pelvic exploration with lysis of adhesions.  SURGEON:  Rochel Brome, MD  ANESTHESIA: General.   INDICATIONS: This 81 year old female recently entered the hospital with findings of profound anemia requiring blood transfusions. Had colonoscopy with findings of a villous adenoma of the cecum which appeared suspicious for cancer. She also has had some history of pelvic pain, had recent CT findings of 2 pockets of air within the bladder, some thickening of the lateral wall, extensive diverticulosis and scant amount of fluid adjacent to the bladder and colon. This raised some suspicion of colovesical fistula. The patient reported no pneumaturia. A recent urinalysis demonstrated some bacteria and positive leukocyte esterase. The laparoscopic right colectomy was recommended and also recommended exploration of the pelvis with possible need for sigmoid colectomy.   DESCRIPTION OF PROCEDURE: The patient was placed on the operating table in the supine position under general endotracheal anesthesia. The circulating nurse inserted a Foley urinary catheter with Betadine preparation of perineum draining a clear yellow urine. The legs were placed into the lithotomy position using bumblebee stirrups. The abdomen was prepared with ChloraPrep and the perineum was prepared with Betadine solution. The abdomen was draped in a sterile manner.   I observed that there was an old right paramedian incision.   The first incision was made just below the umbilicus, some 12 mm in length, carried down through subcutaneous tissues to the  deep fascia which was grasped with laryngeal hook and elevated. A Veress needle was inserted, aspirated and irrigated with a saline solution. Next, the peritoneal cavity was inflated with carbon dioxide. The Veress needle was removed. The 10 mm cannula was inserted. The 10 mm, 0 degree laparoscope was inserted to view the peritoneal cavity. Another incision was made in the epigastrium to insert an 11 mm cannula and subsequently another in the lateral aspect of the right upper quadrant to introduce an 11 mm cannula.   Initial inspection revealed location of the cecum. Babcock clamp was introduced. I did note some tattooing of the cecum. The liver appeared normal.   The patient was placed in Trendelenburg position and turned the scope towards the pelvis. Did exchange the 0 degree laparoscope for the 25 degree laparoscope. I could identify the sigmoid colon. There did appear to be some adhesions and a trace of serosanguineous fluid in the pelvis. A number of adhesions were taken down using the Harmonic scalpel and did identify the right tube and also a small portion of right ovary, and at this point  subsequently deferred further pelvic exploration until later in the procedure.   Attention was turned back to the right colon. The right colon was mobilized with incision of the lateral peritoneal reflection. I could see that the appendix had previously been removed and also the gallbladder had previously been removed. Mobilization of the right colon was done with use of Harmonic scalpel. The right transverse colon was also mobilized and identified the duodenum, which was separated from the mesentery. The terminal ileum was mobilized as well until there was satisfactory mobilization of the right colon.   Next, attention was turned back to the pelvis. I did make a midline incision both above and below the navel long  enough to admit a hand. I inserted the GelPort and subsequently reinserted the laparoscope and placed  the patient in Trendelenburg position and did hand exploration of the pelvis. I also inserted another port in the lower aspect of the right lower quadrant. There was an 11 mm port. The pelvic area was further evaluated. A small amount of serosanguineous fluid was aspirated. With hand palpation there was mild degree of thickening of the sigmoid colon. There was no palpable hard mass. With some further use of Harmonic scalpel, a number of adhesions were taken down, I could see that the sigmoid colon was not adherent to the bladder. I could palpate the Foley catheter balloon within the bladder and further see the course of the sigmoid colon extending down into the pelvis. There were a number of adhesions involving the fimbria and tubes. After satisfactory immobilization of the sigmoid colon, I could see that it was separate from the bladder. It did not appear that there was a colocystic fistula and there was not any significant findings of current diverticulitis and determined that it would not be necessary to remove the sigmoid colon.   Next, attention was turned back to do the right colectomy. The GelPort was removed. The right colon was brought out through the incision. The mesenteric dissection was begun with the Harmonic scalpel creating a window in the mesentery of the terminal ileum some 4 inches proximal to the ileocecal valve. Also, the mesenteric dissection was begun just to the right of midline of the transverse colon. The omentum was divided just to the right of the midline with the Harmonic scalpel. The mesenteric dissection was undertaken with the Harmonic scalpel. The ileocolic artery and vein were suture ligated with 0 chromic and divided with the Harmonic scalpel, and also there was 1 other palpable artery just cephalad to that, which was suture ligated with 0 chromic and divided with the Harmonic scalpel. There was no grossly palpable adenopathy in the mesentery, and after completion of the  mesenteric dissection, the small bowel was brought adjacent to the transverse colon and held with Allis clamps. An enterotomy was made and also colotomy made so that the GIA 75 stapler was introduced along the antimesenteric borders, engaged and activated, beginning the anastomosis. The staple line appeared to be hemostatic. Next, anastomosis was completed with application of the AO-13 stapler, which was placed perpendicular to the first staple line, engaged and activated, and the specimen was excised. It is noted that there was a palpable soft mass within the cecum. I did not see any tumor growing through the bowel wall. It was submitted in formalin for routine pathology. The anastomosis was inspected. Several small bleeding points were cauterized. One clamped vessel was ligated with 5-0 Vicryl. The junction of the staple lines was imbricated with 5-0 Vicryl. The apex of the staple line was imbricated with 5-0 Vicryl. The mesenteric defect was closed with running 3-0 chromic. It is noted that during the course of this a number of small bleeding points were cauterized. Hemostasis subsequently appeared to be intact. The pull suction was placed along the right colic gutter and just aspirated a small amount of serosanguineous fluid. Subsequently, the bowel was returned to the abdominal cavity, omentum was brought beneath the wound, and gloves, gown, instruments and towels were changed. The midline fascia was closed with interrupted 0 Maxon figure-of-eight sutures. All skin incisions were closed with interrupted 4-0 nylon vertical mattress sutures. It is noted that the tissues surrounding the midline fascia and  the subcutaneous tissues of the midline incision were infiltrated with 0.5% Sensorcaine with epinephrine, and also following closure of skin, the dressings were applied with paper tape.   The patient appeared to be in satisfactory condition. Foley catheter was left intact for monitoring. The patient was  prepared for transfer to the recovery room.  The hand assisted pelvic exploration and lysis of adhesions did comprise more than half of opeerating time,  having patient in lithotomy position and adding additional port and hand assisted appliance. ____________________________ J. Rochel Brome, MD jws:sb D: 10/29/2012 11:31:58 ET T: 10/29/2012 11:59:35 ET JOB#: [id number]  cc: Loreli Dollar, MD, <Dictator> Loreli Dollar MD ELECTRONICALLY SIGNED 10/30/2012 17:22

## 2014-06-24 NOTE — Discharge Summary (Signed)
PATIENT NAME:  Lauren Mccann, MOAT MR#:  [id number] DATE OF BIRTH:  06/13/1930  DATE OF ADMISSION:  10/29/2012 DATE OF DISCHARGE:  11/05/2012  HISTORY OF PRESENT ILLNESS: This 81 year old female has a history of profound anemia with hemoglobin of 6, requiring blood transfusions. She had recent colonoscopy findings of a large mass of the cecum which appeared suspicious for cancer. Biopsy demonstrated villous adenoma with high-grade dysplasia.   Her preoperative CT scan showed 2 air bubbles in the bladder and we considered the possibility of colovesical fistula and elected to examine the pelvic area during surgery.   She did have bowel preparation at home, was brought in through the outpatient surgery department, carried to the operating room, did have a preoperative prophylactic antibiotic. She had laparoscopy with right colectomy, also, hand-assisted dissection of the pelvis, which could see that the bowel could be separated from the bladder and did not appear to have colovesical fistula. A number of adhesions were lysed.   HOSPITAL COURSE:  Postoperatively, she did receive prophylactic subcutaneous heparin. It did take quite a bit of encouragement to get her to walk, but did eventually walk some with a walker and began initially with clear liquid diet, and gradually advanced diet and tolerated satisfactorily. Did have some problems with fecal incontinence. Did have some diarrhea. Did order C. diff study,  however, the last day of hospital admission, stool sample not collected as she did not have another diarrheal stool. Did make arrangements for  home health care nursing and have an aid to come into her apartment at the [facility]. Final pathology was pending at the time of discharge.   FINAL DIAGNOSIS:  Tubulovillous adenoma of the cecum; pathology pending.   ____________________________ J. Rochel Brome, MD jws:nts D: 11/13/2012 19:07:00 ET T: 11/14/2012 02:35:59 ET JOB#: [id number]  cc: Loreli Dollar, MD, <Dictator> Loreli Dollar MD ELECTRONICALLY SIGNED 11/15/2012 8:01

## 2014-06-24 NOTE — Discharge Summary (Signed)
PATIENT NAME:  Lauren Mccann, Lauren Mccann MR#:  [id number] DATE OF BIRTH:  26-Mar-1930  DATE OF ADMISSION:  11/18/2012 DATE OF DISCHARGE:  11/30/2012  HISTORY AND HOSPITAL COURSE: This 81 year old female was admitted emergently with chief complaint of abdominal pain, some nausea, cessation of bowel movements.   She had a history of iron deficiency anemia and colonoscopy findings of a tubulovillous adenoma of the cecum. She had a recent laparoscopic right colectomy. The tubulovillous adenoma was found to be benign. 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   She had recently been discharged to [facility] however found it difficult to do the exercises prescribed, was having some generalized weakness, limited oral intake and was admitted. Her admission CT scan suggested small bowel obstruction. She soon thereafter developed diarrhea. A follow-up x-ray demonstrated only some gas in 2 loops of small bowel, but did not appear to be obstructed. Did have continued problems with profound diarrhea and does have chronic fecal incontinence, also has chronic urinary incontinence. She had recent C. diff study, which was negative. She has had limited oral intake, limited activity level. Has been treated with Questran and also calcium carbonate and also p.r.n. Lomotil and with time her diarrhea has resolved. The calcium carbonate was discontinued, Lomotil was no longer needed.   PAST MEDICAL HISTORY: Does include: 1.  Iron deficiency anemia, as noted above, which had resolved during the preoperative period. 2.  Pulmonary embolism and does have vena cava filter.  3.  Chronic anxiety. 4.  Chronic fibromyalgia and multiple pains.  5.  Environmental allergies.   PAST SURGICAL HISTORY: Includes: 1.  Right mastectomy and left mastectomy. 2.  Cholecystectomy. 3.  Back surgery. 4.  Hysterectomy. 5.  Recent laparoscopic right colectomy with hand-assisted exploration  of the pelvis due to possibility of colovesical fistula, but it was ruled out.   MEDICATIONS: Include: 1.  Alprazolam 0.5 mg 3 times a day. 2.  Diltiazem 30 mg 2 times per day. 3.  Cholestyramine 4 grams daily.  4.  Acetaminophen 1 or 2 p.o. q. 4 hours p.r.n. for pain.  5.  Tramadol 50 mg p.r.n. for pain. 6.  Recent ferrous sulfate 325 mg 2 times per day. 7.  Crestor 5 mg daily. 8.  Gabapentin 100 mg 2 tablets 3 times per day. 9.  Furosemide 20 mg daily.   PHYSICAL EXAMINATION: Was found to be tremulous. Lungs sounds were clear. Heart regular rhythm, S1 and S2. Abdomen with moderate tenderness. Well-healed midline incision and laparoscopic incision.   LABORATORY DATA: Latest lab work was done on September 26th. Potassium was slightly low at 3.4, albumin was low at 2.2. White blood count 7500 and hemoglobin 9.8 with MCV of 83.   FINAL DIAGNOSES: 1.  Transient small bowel obstruction.  2.  Antibiotic-associated diarrhea.  3.  Anemia.  4.  Hypoalbuminemia.  5.  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  DISPOSITION: I discussed discharge plans with her. We are going to plan for her to go to [facility] where she can have rehabilitation and also physical therapy. Take a regular diet. Gradually increase activities.   DISCHARGE INSTRUCTIONS: Include: Take acetaminophen 325 mg 1 or 2 p.o. q. 4 hours p.r.n. for pain, tramadol 50 mg one every 8 to 4 hours as needed for pain, gabapentin 100 mg 2 capsules 3 times per day, sertraline 50 mg 3 tablets daily, Lomotil 1 tablet 3 times a day p.r.n.  for diarrhea, cholestyramine 4 grams daily, Crestor 5 mg daily, alprazolam 0.5 mg 3 times per day, diltiazem 30 mg 2 times per day, Nasonex 50 mcg inhalation nasal spray 2 sprays each nostril daily as needed for allergies and Nexium 40 mg daily.   Will plan at this time to discontinue Xarelto as it can cause some nausea and the patient is concerned about bleeding. Has  been a number of months since her pulmonary embolism. Does have vena cava filter.   We will plan to discontinue her furosemide, which she has not taken while in the hospital as she has no dependent edema and there are concerns about potential dehydration.   Plan is to take a regular diet, have physical therapy.   The plan is to follow up with me in the surgery office in 2 weeks. Also plan oncology consultation following that. ___________________________ J. Rochel Brome, MD jws:sb D: 11/30/2012 11:47:25 ET T: 11/30/2012 12:15:41 ET JOB#: [id number]  cc: Loreli Dollar, MD, <Dictator> Loreli Dollar MD ELECTRONICALLY SIGNED 12/04/2012 12:23

## 2014-06-24 NOTE — H&P (Signed)
PATIENT NAME:  Lauren Mccann, Lauren Mccann MR#:  [id number] DATE OF BIRTH:  1930-06-21  HISTORY OF PRESENT ILLNESS: This 81 year old female came in to the Emergency Room with chief complaint of fever. 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 She also had upper endoscopy findings of hiatus hernia, mild chronic duodenitis. She recently has been treated with iron, and hemoglobin improved.   She was brought in for elective surgery. Had laparoscopic right colectomy. Also had laparoscopic evaluation of the pelvis. She has also had findings consistent with pelvic adhesions and cystitis.   She had been discharged from the hospital 4 days ago, was having some diarrhea at the time of discharge. Stool sample could not be collected for C. diff on day of discharge, as she denied having diarrhea at a time it could be collected, but requested collection by home health agency at home; however, the patient says the stool sample could not be collected over the last few days. Says stool was black in color. The patient reports she has not had much appetite since discharge although she was eating solid food prior to discharge. She has just mostly been drinking some clear liquids including sodas. She has had numerous bouts of diarrhea. Has had some weakness. Has had limited amount of walking. Has fallen twice. Suffered a contusion of the left lower leg and also fell on her buttock area.   She reports she has had a temperature of 101 the last 2 days. She says she vomited early this morning. Has had limited oral intake today. Has not been taking her Xarelto since discharge. Has had somewhat limited urine output.   PAST MEDICAL HISTORY:  Was reviewed. Does include:  1.  A past history of stroke with left facial weakness, which lasted about 2 days, and had slurred speech and resolved.  2.  History of chronic  diarrhea. Occasionally takes an Imodium.  3.  History of fibromyalgia with diffuse body aches and pains and chronic low back pain, chronic arthritis.  4.  History of pulmonary embolism, September 2013. Took Xarelto for a number of months. Had  vena cava filter inserted and had recently discontinued Xarelto but recently was advised to start back on Xarelto.   PAST SURGICAL HISTORY:  Includes: 1.  Open cholecystectomy with appendectomy many years ago.  2.  Vaginal hysterectomy.  3.  Right mastectomy for cancer in 1995.  4.  Left mastectomy for cancer, 2009.  5.  Excision, cataract, left eye.  6.  Inferior vena cava filter.   MEDICATIONS:  Include: 1.  Citracal plus D.  2.  Petites tablet 200 mg one tablet twice a day.  3.  Tramadol 50 mg q.4 hours p.r.n. for pain.  4.  Allegra 180 mg daily.  5.  Xarelto 20 mg every night but not taken since discharge.  6.  Furosemide 20 mg daily.  7.  Alprazolam 1 mg half tablet 3 times a day.  8.  Crestor 5 mg daily.  9.  Cardizem 30 mg q.6 hours.  10.  Diflucan recently taken 100 mg.  11.  Gabapentin 100 mg three times a day.  12.  Ferrous sulfate 325 mg, recently discontinued.   ALLERGIES:  1.  CODEINE.  2.  PREDNISONE.  3.  INTENSOL.  4.  FOSAMAX.  5.  DARVOCET.  6.  LEVAQUIN.  7.  LIPITOR.   FAMILY HISTORY: Negative for  colon cancer. Positive for bone cancer, heart disease, breast cancer and melanoma.   SOCIAL HISTORY: Lives at the [place], in independent living.  Fixes her own meals. Does not smoke. Does not drink any alcohol.   REVIEW OF SYSTEMS: Does have diffuse muscular aches. She has been weak since discharge. Limited activities. Reports no cough or cold or sore throat, but throat has been somewhat dry. Has been breathing satisfactorily, having no chest pain. Limited urine output, frequent diarrhea. No rectal bleeding but stools have been dark. Did fall 2 times, suffering a goose-egg-type appearance of left anterior tibial aspect of  the leg and some mild discomfort in the coccyx area. She reports the above-mentioned fever. Review of systems otherwise negative.   PHYSICAL EXAMINATION:  VITAL SIGNS: Temperature 98.4, pulse 96, respirations 20, blood pressure 124/65, oxygen saturation 94%. She is awake, alert, oriented and minimal degree of distress.  SKIN: Appears somewhat pale.  HEENT: Pupils equal, reactive to light. Appears to have arcus senilis. Pharynx is dry but clear.  NECK: No palpable mass.  LUNGS: Lung sounds clear.  HEART: Regular rhythm, S1 and S2.  ABDOMEN: Moderate right upper quadrant tenderness, minimal degree of tenderness on the left side.  EXTREMITIES: Bruise approximately 2 x 4 cm, anterior tibial aspect of the left leg. There is no contusion found in the buttock area.   CLINICAL DATA: Her metabolic panel was with a sodium of 131, potassium 3.3, chloride 96, albumin 2.5, alkaline phosphatase 163, SGOT 61. White blood count 18,900, hemoglobin 10.1.   CT scan was reviewed. I reviewed the images and also the report. There are findings of fluid collection in the right upper quadrant just inferior to the right lobe of the liver. This was measured at some 3.7 cm. There is an adjacent fluid collection of some 3 cm.   Pathology for surgery has just been obtained. It did demonstrate a neuroendocrine tumor arising in the distal ileum, 2 cm in dimension, involving 5 out of 14 lymph nodes, reported as grade I. There was also a finding of a tubulovillous adenoma of the cecum with high-grade dysplasia which was 4 cm in dimension. Margins were negative.   IMPRESSION: Abdominal abscess.   PLAN: Admission to the hospital. IV fluids. I recommended CT-guided catheter drainage. We will request that for tomorrow in the radiology department with CT-guided catheter drainage with culture.   I discussed this with the patient and I will get her admitted.   ____________________________ Lenna Sciara. Rochel Brome, MD jws:np D: 11/09/2012  22:08:14 ET T: 11/09/2012 22:43:54 ET JOB#: [id number]  cc: Loreli Dollar, MD, <Dictator> Loreli Dollar MD ELECTRONICALLY SIGNED 11/10/2012 18:00

## 2014-06-25 NOTE — Consult Note (Signed)
Brief Consult Note: Diagnosis: nausea/vomiting.   Patient was seen by consultant.   Consult note dictated.   Recommend further assessment or treatment.   Orders entered.   Discussed with Attending MD.   Comments: No sign of SBO. will follow suspect mild lac level increase related to dehydration.  Electronic Signatures: Florene Glen (MD)  (Signed 07-Jan-15 17:41)  Authored: Brief Consult Note   Last Updated: 07-Jan-15 17:41 by Florene Glen (MD)

## 2014-06-25 NOTE — H&P (Signed)
PATIENT NAME:  Lauren Mccann, Lauren Mccann MR#:  [id number] DATE OF BIRTH:  18-Apr-1930  DATE OF ADMISSION:  03/10/2013  PRIMARY CARE PHYSICIAN: Dr. Juluis Pitch.   REFERRING ER PHYSICIAN: Dr. Johnathan Hausen.  CHIEF COMPLAINT:  Vomiting and diarrhea.   HISTORY OF PRESENT ILLNESS: This is an 81 year old female with a past medical history of breast cancer and status post resection, history of CVA, chronic diarrhea, chronic back pain, fibromyalgia, history of pulmonary embolism on Xarelto who lives alone at senior apartment house and independent in her day-to-day activities but feels like now she is not able to do many activities, but she just let it go and tried to manage on her own whatever she can do. Today, morning, she started having severe vomiting which was dark and color multiple time she even lost her count, continued for 5 to 6 hours that was dark and coffee-colored and it tasted like her stool. She also had some pain in the abdomen and she decided to come to the Emergency Room, called EMS and brought over here. In the ER, it was also noticed she had stool twice which was loose and dark-colored. She also felt some feverish but did not notice any regular fever or chills. On workup in ER, she was found having high lactic acid and elevated white cell count and so CT scan of the abdomen was done which showed dilated, fluid-filled small bowel loops, possibly Ileus , no obstruction and so surgery saw the patient and they suggested to continue medical management and medical service is being contacted for further help in this case.   REVIEW OF SYSTEMS: CONSTITUTIONAL: Negative for fever. Positive for fatigue and generalized weakness, pain in the abdomen. No weight loss or weight gain.  EYES: No blurring, double vision, discharge or redness.  EARS, NOSE, THROAT: No tinnitus, ear pain or hearing loss.  RESPIRATORY: No cough, wheezing, hemoptysis or shortness of breath.  CARDIOVASCULAR: No chest pain,  orthopnea, edema or arrhythmia.  GASTROINTESTINAL: The patient has nausea, vomiting and 2 episodes of loose stool and abdominal pain. No blood in the stool or vomit.  GENITOURINARY: No dysuria, hematuria or increased frequency.  ENDOCRINE: No heat or cold intolerance or increased sweating.  MUSCULOSKELETAL: No pain or swelling in the joints.  NEUROLOGICAL: No numbness, weakness, tremor or vertigo.  PSYCHIATRIC: No anxiety, insomnia, bipolar disorder.   PAST MEDICAL HISTORY:  1.  Breast cancer, status post resection 6 years ago.  2.  History of CVA.  3.  Chronic diarrhea.  4.  Chronic back pain.  5.  Fibromyalgia.  6.  History of PE, on Xarelto.    PAST SURGICAL HISTORY: Twelve inches of colon was resected in the past.   SOCIAL HISTORY: No smoking, no alcohol drinking, no illegal drug use. She lives in a senior apartment house and she is independent in most of the day-to-day activities but now slowing down because of her restriction and weakness and let it go whenever she is not able to do some activity.   FAMILY HISTORY: Positive for bone cancer in her father   HOME MEDICATIONS:  1.  Zoloft 100 mg oral tablet once a day.  2.  Zofran 4 mg oral tablet every 4 to 6 hours as needed for nausea.  3.  Tramadol 50 mg oral tablet every 4 hours as needed for pain.  4.  Nexium 40 mg oral tablet once a day.  5.  Nasonex 50 mcg inhalation 2 sprays each nostril once a day  as needed for allergy.  6.  Gabapentin 100 mg oral 2 capsules 3 times a day.  7.  Diltiazem 30 mg oral tablet 2 times a day.  8.  Alprazolam 1 mg oral 3 times a day.  9.  Acetaminophen 325 mg tablet 1 to 2 tablets every 4 hours as needed for pain.   PHYSICAL EXAMINATION: VITAL SIGNS:  In ER, temperature 97.6, pulse 107, respirations 20, blood pressure 111/76, pulse ox 93% on room air.  GENERAL: The patient is fully alert and oriented to time, place and person, slightly distressed due to abdominal pain.  HEENT: Head and neck  atraumatic. Conjunctivae pink. Oral mucosa moist.  NECK: Supple. No JVD.  RESPIRATORY: Bilateral clear and equal air entry.  CARDIOVASCULAR: S1, S2 present, regular. No murmur.  ABDOMEN: Soft, nontender. Bowel sounds present, sluggish. No organomegaly.  SKIN: No rashes.  LEGS: No edema.  NEUROLOGICAL: Power 4 out of 5. Moves all 4 limbs, generalized weakness.  PSYCHIATRIC: Does not appear in any acute psychiatric illness at this time.   IMPORTANT LABORATORY AND RADIOLOGICAL RESULTS: Glucose 157, BUN 14, creatinine 1.0, sodium 133, potassium 3.6, chloride 100, CO2 of 27, anion gap is 6 and calcium is 8.6. Total protein 8.2, albumin 3.3, bilirubin 0.3, alkaline phosphate 96, SGOT 35 and SGPT 20. Troponin less than 0.02. WBC 16.9, hemoglobin 12.1, platelet count 332, MCV is 85. INR is 1.4, prothrombin 17.1. Urinalysis is positive with 50 WBCs and 2+ leukocyte esterase, 3+ bacteria. Lactic acid is 2.9. CT scan of the abdomen and pelvis is done with contrast, showed:  1.  Distended fluid-filled small bowel without obstruction, ascending colectomy.  2.  Ileocolonic anastomosis appears patent.  3.  Maybe associated ileus or enteric infection.  4.  Large hiatal hernia.  5.  Left renal cyst.  6.  Cholecystectomy and hysterectomy.  7.  Unchanged IVC filter,   ASSESSMENT AND PLAN: An 81 year old female who has past medical history of colonic surgery, breast cancer and surgery, cerebrovascular accident, fibromyalgia, chronic back pain and lives alone, now came to the hospital today because of vomiting and having 2 episodes of diarrhea, found having urinary tract infection and Ileus on CAT scan.  1. Ileus.  Seen by surgical, Dr. Burt Knack. There is no surgical intervention at this time, will manage medically. We will give IV fluid and give supportive care with Zofran and Phenergan.  2.  Urinary tract infection. We will give her Rocephin IV and will get urine cultures.  3.  Generalized weakness. Will call  physical therapy evaluation. She might need placement in rehab because of her overall weakness. 4.  Elevated lactic acid. Most likely it is due to dehydration and complaint of nausea and vomiting.  We will monitor and hydrate her.  5.  CODE STATUS: Full code. I discussed with the patient. Her healthcare power of attorney is daughter-in-law and the patient's wishes are to be full code in initial setting but if she is for long-term on the machine then she would not like to continue those.   TOTAL TIME SPENT ON THIS ADMISSION: 50 minutes.    ____________________________ Ceasar Lund Anselm Jungling, MD vgv:[id number] D: 03/10/2013 18:21:00 ET T: 03/10/2013 19:05:33 ET JOB#: [id number]  cc: Ceasar Lund. Anselm Jungling, MD, <Dictator> Youlanda Roys. Lovie Macadamia, MD Vaughan Basta MD ELECTRONICALLY SIGNED 03/10/2013 22:15

## 2014-06-25 NOTE — Consult Note (Signed)
PATIENT NAME:  Lauren Mccann, Lauren Mccann MR#:  [id number] DATE OF BIRTH:  Aug 05, 1930  DATE OF CONSULTATION:  03/18/2013  REFERRING PHYSICIAN:  Dr. Minerva Fester PHYSICIAN:  Corey Skains, MD  REASON FOR CONSULTATION: Pulmonary embolism, deep venous thrombosis, shortness of breath with cardiac mass.   CHIEF COMPLAINT: "I am short of breath."   HISTORY OF PRESENT ILLNESS: This is an 81 year old female with known previous cancer who has had a deep venous thrombosis and pulmonary embolism over a year prior. She was placed on anticoagulation at that time and had done fairly well from that standpoint with improvements. In addition to that she was placed with a IVC filter due to other concerns and the IVC arms of 2 which dislodged and lodged into the right atrial trabeculated. This has been stable for the last 9 to 12 months with no changes. CAT scan recently has shown this stability with no evidence of current pulmonary embolism. The patient has short of breath with physical activity, relieved by rest, which is not significantly increasing in frequency and intensity in the last several months, but has been while she has been at the hospital due to the fact that she has had a bowel obstruction. The bowel obstruction now is improved and she is ambulating well. She has not had a myocardial infarction and echocardiogram shows normal LV systolic function with minor valvular heart disease but no evidence of pulmonary hypertension.   REVIEW OF SYSTEMS: Remaining review of systems negative for syncope, dizziness, nausea, diaphoresis, weakness, fatigue, frequent urination, urination at night, muscle weakness, numbness, anxiety, depression, skin lesions, or skin rashes.   PAST MEDICAL HISTORY: 1. Deep venous thrombosis.  2. Cancer.  3. Hypertension.   FAMILY HISTORY: No family members with early onset of cardiovascular disease or hypertension.   SOCIAL HISTORY: Currently denies alcohol or tobacco use.    ALLERGIES: As listed.   MEDICATIONS: As listed.   PHYSICAL EXAMINATION: VITAL SIGNS: Blood pressure 126/68 bilaterally, heart rate 72 upright, reclining, and regular.  GENERAL: She is a well appearing female in no acute distress.  HEENT: No icterus, thyromegaly, ulcers, hemorrhage, or xanthelasma.  CARDIOVASCULAR: Regular rate and rhythm with normal S1 and S2 without murmur, gallop, or rub. PMI is normal size and placement. Carotid upstroke normal without bruit. Jugular venous pressure is normal.  LUNGS: Have few basilar crackles with normal respirations.  ABDOMEN: Soft, nontender, without hepatosplenomegaly or masses. Abdominal aorta is normal size without bruit.  EXTREMITIES: 2+ bilateral pulses in dorsal, pedal, radial, femoral,  without evidence of lower extremity edema, cyanosis, clubbing or ulcers.  NEUROLOGIC: She is oriented to time, place, and person, with normal mood and affect.   ASSESSMENT: A 81 year old female with cancer, deep venous thrombosis, shortness of breath with cardiac mass, most consistent with two arms of an IVC filter stable at this time without any movement or current concerns.   RECOMMENDATIONS: 1. No further cardiac work-up or treatment of stable cardiac metallic abnormalities due to above.  2. Anticoagulation with Xarelto for further risk reduction of deep venous thrombosis and pulmonary embolism with cancer.  3. Hypertension controlled with goal systolic blood pressure below 140 mm.  4. Further work-up of shortness of breath at a later date if not improved after rehabilitation and hospitalization.  5. The patient is at low risk for cardiovascular complication with discharge to home and follow-up.  ____________________________ Corey Skains, MD bjk:sg D: 03/18/2013 13:25:34 ET T: 03/18/2013 14:08:24 ET JOB#: [id number]  cc: Corey Skains,  MD, <Dictator> Corey Skains MD ELECTRONICALLY SIGNED 03/22/2013 8:46

## 2014-06-25 NOTE — Consult Note (Signed)
PATIENT NAME:  Lauren Mccann, Lauren Mccann MR#:  [id number] DATE OF BIRTH:  Aug 08, 1930  DATE OF CONSULTATION:  03/15/2013  REFERRING PHYSICIAN:  Epifanio Lesches, MD CONSULTING PHYSICIAN:  Lew Dawes. Genevive Bi, MD  REASON FOR CONSULTATION: Foreign body, right ventricle.   I have personally seen and examined Ms. Lauren Mccann. I have discussed her care with Dr. Vianne Bulls. I have independently reviewed her films.   HISTORY OF PRESENT ILLNESS: Ms. Lauren Mccann is an 81 year old white female who has had a pulmonary embolism several years ago and was diagnosed as having a metastatic carcinoid of the terminal ileum and underwent a right hemicolectomy. Prior to her surgery, she had a filter placed in her inferior vena cava so that she could be off her anticoagulation in the perioperative period. She had that placed approximately a year and a half ago and has been followed with serial CT scans for her underlying metastatic carcinoid. She has been admitted to the hospital on several occasions with partial small bowel obstructions as well as reflux and diarrhea. She does have a large hiatal hernia, but her most recent admission was for diarrhea, which was thought secondary to her terminal ileal resection. 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 Looking back over her prior CT scans, it would appear that she had this back in August 2014. She has never had a chest CT but on the abdominal CT scan portions, one can see that there are several legs of the filter that are present in the right ventricle. I was asked to see the patient to consider removal. The patient states that she had the filter placed in September 2013 and that went without incident. In August 2014 is when it was first noticed that some of the struts were dislodged and were in the right ventricle. The patient denies any history of hemoptysis. She denies any chest pain per se.  She does not drink or smoke and lives independently. She is fairly functional despite the diagnosis that she carries of metastatic carcinoid. As best I can tell, there is no evidence of spread in her liver or in her lung.   PHYSICAL EXAMINATION: GENERAL: She is a well-developed, well-nourished woman who appears younger than her stated age.  HEART: Regular without murmurs. There are occasional premature beats.  LUNGS: Clear bilaterally.  ABDOMEN: Soft and nontender. There are no palpable masses. There is a well-healed scar.   ASSESSMENT AND PLAN: I have reviewed the most recent abdominal CT scan. There has not been a chest CT scan made. I do not see any complicating features of the struts in the heart. I do not see a pericardial effusion or frank perforation. However, to be safe, I believe it would be best to obtain a baseline CT scan of the chest without contrast and also to get an echocardiogram to assess the competency of the tricuspid valve. Once these are complete, we can then make further recommendations.   Thank you very much for allowing me to participate in her care.   ____________________________ Lew Dawes. Genevive Bi, MD teo:jcm D: 03/15/2013 15:08:59 ET T: 03/15/2013 16:31:56 ET JOB#: [id number]  cc: Christia Reading E. Genevive Bi, MD, <Dictator> Louis Matte MD ELECTRONICALLY SIGNED 03/24/2013 16:01

## 2014-06-25 NOTE — Discharge Summary (Signed)
PATIENT NAME:  Lauren Mccann, Lauren Mccann MR#:  [id number] DATE OF BIRTH:  Sep 01, 1930  DATE OF ADMISSION:  03/10/2013 DATE OF DISCHARGE:  03/18/2013  Please refer to the interim discharge summary dictated by Dr. Vianne Bulls on 03/17/2013. Today, on 03/18/2013, the patient felt satisfactory, did not complain of any significant discomfort. She was ready to be discharged home. Because of her foreign body in the right ventricle, I felt it would be prudent to discuss her case with cardiologist, and Dr. Nehemiah Massed was consulted. Dr. Nehemiah Massed saw the patient in consultation the same day, 03/18/2013. He felt that the patient does not require any further cardiac workup due to stable cardiac  metallic abnormalities; persistent, stable for approximately a year now on repeated CT scans. He recommended to continue anticoagulation with Xarelto for further risk reduction of deep vein thrombosis and pulmonary embolism due to cancer. He recommended hypertension  control with goal systolic blood pressure below 140 and further workup of shortness of breath at a later day if not improved at the rehabilitation and hospitalization. He recommended that the patient otherwise is low risk for cardiovascular complications and recommended to be discharged home and follow up with him in the next few days after discharge. The patient is being discharged home.  She refused rehabilitation facility. First, in regards to SIRS, sepsis as mentioned above, the patient's sepsis was on arrival very likely related to E. coli urinary tract infection. The patient was advised to continue Keflex for 7 days, as previously noted, and prescriptions were sent to [facility]. In regards to partial small bowel obstruction, this improved; now, she is tolerating diet. In regards to her chronic diarrhea, due to neuroendocrine tumor, the patient is to continue simvastatin as per [place]. Her diarrheal stool has subsided. She is being given antidiarrheal medications for home  as needed. 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 Genevive Bi, as well as Dr. Lucky Cowboy, and now Dr. Nehemiah Massed saw the patient in consultation, who did not feel that any further interventions are necessary. The patient is to follow up, however, with cardiologist as an outpatient. In regards to history of pulmonary embolism, the patient due to start Xarelto.   DISCHARGE MEDICATIONS: Are as follows:  Gabapentin 100 mg 2 capsules 3 times daily, tramadol 50 mg every 4 hours as needed, Nasonex 2 sprays to each nostril once daily as needed, Nexium 40 mg p.o. daily, diltiazem 30 mg p.o. twice daily, Tylenol 325 mg 1 to 2 tablets every 4 hours as needed, alprazolam 1 mg 3 times daily as needed, Zofran 4 mg every 4 to 6 hours as needed, Zoloft 100 mg p.o. daily, atropine diphenoxylate 0.025 mg/2.5 mg oral tablet 3 times daily as needed, Keflex 500 mg twice daily for 7 more days.   The patient is to continue home health physical therapy at home as well as R.N., who will be recommended for the patient upon discharge.   HOME OXYGEN: None.   DIET: Two-gram salt, low-fat, low-cholesterol. Dietary supplements:  None. Diet consistency: Regular consistency.   ACTIVITY LIMITATIONS: As tolerated.  Follow up with Dr. Nehemiah Massed and Dr. Oliva Bustard in the next 1 to 2 weeks after discharge.   The patient's discharge vitals are as follows:  Temperature was 98.1. Pulse was 71. Respiratory rate was 18 to 20. Blood pressure 115/75. Saturation was 94% on room air at rest.   TIME SPENT:  40 minutes on this patient.  ____________________________  Theodoro Grist, MD rv:dmm D: 03/18/2013 16:02:17 ET T: 03/18/2013 20:00:56 ET JOB#: [id number]  cc: Theodoro Grist, MD, <Dictator> Janak K. Oliva Bustard, MD Corey Skains, MD Caitlen Worth MD ELECTRONICALLY SIGNED 03/26/2013 14:04

## 2014-06-25 NOTE — Consult Note (Signed)
CHIEF COMPLAINT and HISTORY:  Subjective/Chief Complaint abdominal pain, diarrhea   History of Present Illness Asked to see patient regarding incidental finding on imaging today.  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  Generally did well following this.  Her filter remains in place.  She had an abdominal XRAy today and a leg of the IVC filter appears to be in the right ventrical.  In review of her CT scans from August 2014 until yesterday, the device appears the same in the cava meaning the leg has been dislodged for at least five months.  No CT from Sept 2013 until August 2014, so unable to assess when this occurred, but somewhere in that interim.  She has no chest pain, SOB, or arrhythmia issues that are new in last 2 years.  Has recovered from her large PE largely.  Here for unrelated issues.   PAST MEDICAL/SURGICAL HISTORY:  Past Medical History:   Pulmonary Embolus:    anxiety:    Fibromyalgia:    allergies:    mastectomy:    simple mastectomy left: 17-Dec-2007   Cholecystectomy:    Back surgery:    Hysterectomy:   ALLERGIES:  Allergies:  Prednisone: Chest Pain, SOB  Codeine: Chest Tightness, SOB, Tachycardia  Darvocet - N: SOB  Levaquin: Alt Ment Status  Benadryl: Other  Cymbalta: Unknown  HOME MEDICATIONS:  Home Medications: Medication Instructions Status  atropine-diphenoxylate 0.025 mg-2.5 mg oral tablet 1 tab(s) orally 3 times a day, As needed, diarrhea Active  diltiazem 30 mg oral tablet 1 tab(s) orally 2 times a day Active  ALPRAZolam 1 mg oral tablet 1 tab(s) orally 3 times a day, As Needed - for Anxiety, Nervousness Active  acetaminophen 325 mg oral tablet 1-2 tab(s) orally every 4 hours, As needed, pain. Active  gabapentin 100 mg oral capsule 2 cap(s) orally 3 times a day Active  tramadol 50 mg oral tablet 1 tab(s) orally every 4 hours, As Needed - for Pain  Active  Nasonex 50  mcg/inh nasal spray 2 sprays each nostril once a day, As Needed for allergies Active  NexIUM 40 mg oral delayed release capsule 1 cap(s) orally once a day (in the morning) Active  Zofran 4 mg oral tablet 1 tab(s) orally every 4 to 6 hours, As Needed - for Nausea, Vomiting Active  Zoloft 100 mg oral tablet 1 tab(s) orally once a day Active   Family and Social History:  Family History Non-Contributory   Social History negative tobacco, negative ETOH   Place of Living Home   Review of Systems:  Fever/Chills No   Cough No   Sputum No   Abdominal Pain Yes   Diarrhea Yes   Constipation No   Nausea/Vomiting Yes   SOB/DOE No   Chest Pain No   Dysuria No   Tolerating PT Yes   Tolerating Diet No   Medications/Allergies Reviewed Medications/Allergies reviewed   Physical Exam:  GEN well developed, well nourished   HEENT pink conjunctivae, moist oral mucosa   NECK No masses  trachea midline   RESP normal resp effort  clear BS  no use of accessory muscles   CARD regular rate  no JVD   VASCULAR ACCESS none   ABD positive tenderness  soft   LYMPH negative neck, negative axillae   EXTR negative cyanosis/clubbing, negative edema   SKIN normal to palpation, skin turgor good   NEURO cranial nerves intact,  motor/sensory function intact   PSYCH alert, A+O to time, place, person   LABS:  Laboratory Results: Hepatic:    07-Jan-15 11:29, Comprehensive Metabolic Panel  Bilirubin, Total 0.3  Alkaline Phosphatase 96  45-117  NOTE: New Reference Range  01/22/13  SGPT (ALT) 20  SGOT (AST) 35  Total Protein, Serum 8.2  Albumin, Serum 3.3  Routine BB:    07-Jan-15 14:02, Type and Antibody Screen  ABO Group + Rh Type   A Positive  Antibody Screen NEGATIVE  Result(s) reported on 10 Mar 2013 at 03:51PM.  Routine Micro:    07-Jan-15 15:07, Urine Culture  Organism Name   ESCHERICHIA COLI  Organism Quantity   >100,000 CFU/ML  Micro Text Report   URINE CULTURE     ORGANISM 1                >100,000 CFU/ML ESCHERICHIA COLI    COMMENT                   ID TO FOLLOW SENSITIVITIES TO FOLLOW     ANTIBIOTIC  Specimen Source   CLEAN CATCH  Organism 1   >100,000 CFU/ML ESCHERICHIA COLI  Culture Comment   ID TO FOLLOW SENSITIVITIES TO FOLLOW   Result(s) reported on 11 Mar 2013 at 10:03AM.    08-Jan-15 02:01, Clostridium Difficile  Micro Text Report   CLOSTRIDIUM DIFFICILE    C.DIFFICILE ANTIGEN       C.DIFFICILE GDH ANTIGEN : NEGATIVE    C.DIFFICILE TOXIN A/B     C.DIFFICILE TOXINS A AND B : NEGATIVE    INTERPRETATION            Negative for C. difficile.      ANTIBIOTIC  Cardiology:    07-Jan-15 11:29, ED ECG  Ventricular Rate 103  Atrial Rate 103  P-R Interval 186  QRS Duration 76  QT 352  QTc 461  P Axis 56  R Axis -24  T Axis 40  ECG interpretation   Sinus tachycardia with occasional Premature ventricular complexes  Possible Left atrial enlargement  RSR' or QR pattern in V1 suggests right ventricular conduction delay  Inferior infarct (cited on or before 14-Apr-1996)  Anterior infarct (cited on or before 18-Mar-1995)  Abnormal ECG  When compared with ECG of 18-Nov-2012 16:09,  Premature ventricular complexes are now Present  ----------unconfirmed----------  Confirmed by OVERREAD, NOT ([id number]), editor PEARSON, BARBARA ([id number]) on 03/11/2013 10:44:48 AM  ED ECG   Routine Chem:    07-Jan-15 11:29, Comprehensive Metabolic Panel  Glucose, Serum 157  BUN 14  Creatinine (comp) 1.00  Sodium, Serum 133  Potassium, Serum 3.6  Chloride, Serum 100  CO2, Serum 27  Calcium (Total), Serum 8.6  Osmolality (calc) 270  eGFR (African American) >60  eGFR (Non-African American) 53  eGFR values <52m/min/1.73 m2 may be an indication of chronic  kidney disease (CKD).  Calculated eGFR is useful in patients with stable renal function.  The eGFR calculation will not be reliable in acutely ill patients  when serum creatinine is changing rapidly. It is not  useful in   patients on dialysis. The eGFR calculation may not be applicable  to patients at the low and high extremes of body sizes, pregnant  women, and vegetarians.  Anion Gap 6    08-Jan-15 044:31 Basic Metabolic Panel (w/Total Calcium)  Glucose, Serum 113  BUN 12  Creatinine (comp) 0.98  Sodium, Serum 134  Potassium, Serum 3.8  Chloride, Serum 102  CO2, Serum 27  Calcium (Total), Serum 8.0  Anion Gap 5  Osmolality (calc) 269  eGFR (African American) >60  eGFR (Non-African American) 54  eGFR values <30m/min/1.73 m2 may be an indication of chronic  kidney disease (CKD).  Calculated eGFR is useful in patients with stable renal function.  The eGFR calculation will not be reliable in acutely ill patients  when serum creatinine is changing rapidly. It is not useful in   patients on dialysis. The eGFR calculation may not be applicable  to patients at the low and high extremes of body sizes, pregnant  women, and vegetarians.  Cardiac:    07-Jan-15 11:29, Troponin I  Troponin I < 0.02  0.00-0.05  0.05 ng/mL or less: NEGATIVE   Repeat testing in 3-6 hrs   if clinically indicated.  >0.05 ng/mL: POTENTIAL   MYOCARDIAL INJURY. Repeat   testing in 3-6 hrs if   clinically indicated.  NOTE: An increase or decrease   of 30% or more on serial   testing suggests a   clinically important change  Routine UA:    07-Jan-15 15:07, Urinalysis  Color (UA) Amber  Clarity (UA) Cloudy  Glucose (UA) Negative  Bilirubin (UA) Negative  Ketones (UA) Negative  Specific Gravity (UA) 1.020  Blood (UA) Negative  pH (UA) 5.0  Protein (UA) 30 mg/dL  Nitrite (UA) Positive  Leukocyte Esterase (UA) 2+  Result(s) reported on 10 Mar 2013 at 03:41PM.  RBC (UA) 4 /HPF  WBC (UA) 50 /HPF  Bacteria (UA) 3+  Epithelial Cells (UA) 4 /HPF  Transitional Epithelial (UA) <1 /HPF  Mucous (UA) PRESENT  Hyaline Cast (UA) 10 /LPF  Granular Cast (UA) 2 /LPF  Amorphous Crystal (UA) PRESENT  Result(s)  reported on 10 Mar 2013 at 03:41PM.  Routine Coag:    07-Jan-15 11:29, Activated PTT  Activated PTT (APTT) 32.6  A HCT value >55% may artifactually increase the APTT. In one study,  the increase was an average of 19%.  Reference: "Effect on Routine and Special Coagulation Testing Values  of Citrate Anticoagulant Adjustment in Patients with High HCT Values."  American Journal of Clinical Pathology 2006;126:400-405.    07-Jan-15 11:29, Prothrombin Time  Prothrombin 17.1  INR 1.4  INR reference interval applies to patients on anticoagulant therapy.  A single INR therapeutic range for coumarins is not optimal for all  indications; however, the suggested range for most indications is  2.0 - 3.0.  Exceptions to the INR Reference Range may include: Prosthetic heart  valves, acute myocardial infarction, prevention of myocardial  infarction, and combinations of aspirin and anticoagulant. The need  for a higher or lower target INR must be assessed individually.  Reference: The Pharmacology and Management of the Vitamin K   antagonists: the seventh ACCP Conference on Antithrombotic and  Thrombolytic Therapy. CGYBWL.8937Sept:126 (3suppl): [id number]  A HCT value >55% may artifactually increase the PT.  In one study,   the increase was an average of 25%.  Reference:  "Effect on Routine and Special Coagulation Testing Values  of Citrate Anticoagulant Adjustment in Patients with High HCT Values."  American Journal of Clinical Pathology 2006;126:400-405.  Routine Hem:    07-Jan-15 11:29, CBC Profile  WBC (CBC) 16.9  RBC (CBC) 4.38  Hemoglobin (CBC) 12.1  Hematocrit (CBC) 37.3  Platelet Count (CBC) 332  MCV 85  MCH 27.7  MCHC 32.5  RDW 16.0  Neutrophil % 88.7  Lymphocyte % 5.4  Monocyte % 5.5  Eosinophil % 0.0  Basophil % 0.4  Neutrophil #  15.0  Lymphocyte # 0.9  Monocyte # 0.9  Eosinophil # 0.0  Basophil # 0.1  Result(s) reported on 10 Mar 2013 at 12:02PM.    08-Jan-15 04:32, CBC  Profile  WBC (CBC) 13.6  RBC (CBC) 3.71  Hemoglobin (CBC) 10.4  Hematocrit (CBC) 31.7  Platelet Count (CBC) 269  MCV 86  MCH 28.1  MCHC 32.9  RDW 15.9  Neutrophil % 75.0  Lymphocyte % 11.9  Monocyte % 11.3  Eosinophil % 1.3  Basophil % 0.5  Neutrophil # 10.2  Lymphocyte # 1.6  Monocyte # 1.5  Eosinophil # 0.2  Basophil # 0.1  Result(s) reported on 11 Mar 2013 at 09:47AM.   RADIOLOGY:  Radiology Results: XRay:    31-Aug-14 11:18, Chest Portable Single View  Chest Portable Single View  REASON FOR EXAM:    Complete erect.  Post op cough, tachycardia.  COMMENTS:       PROCEDURE: DXR - DXR PORTABLE CHEST SINGLE VIEW  - Nov 01 2012 11:18AM     RESULT: Comparison is made to a study of November 10, 2011.    The lungs are adequately inflated. The interstitial markings are mildly   prominent. The left hemidiaphragm remains obscured. The cardiac   silhouette is mildly enlarged. The pulmonary vascularity is not clearly   engorged.    IMPRESSION:  There is increased interstitial density in both lungs   especially inferiorly. This may reflect atelectasis. There is no   pulmonary vascular congestion. A followup PA and lateral chest x-ray     would be of value when the patient can tolerate the procedure.     Dictation Site: 5        Verified By: DAVID A. Martinique, M.D., MD    17-Sep-14 17:04, Chest PA and Lateral  Chest PA and Lateral  REASON FOR EXAM:    sob  COMMENTS:       PROCEDURE: DXR - DXR CHEST PA (OR AP) AND LATERAL  - Nov 18 2012  5:04PM     RESULT: Comparison is made to the study of 11/01/2012.    There is a left upper extremity PIC line present with the tip of the   catheter in the superior vena cava near the right atrium. Atherosclerotic   calcification is present within the aorta. There is minimal hazy density   near the left costophrenic angle which could represent atelectasis. The   bones are osteopenic. The heart is nonenlarged. There is no significant    effusion.    IMPRESSION:   1. PICC line present as described. No significant acute cardiopulmonary   disease. Atherosclerotic calcification present.    Dictation Site: 6        Verified By: Sundra Aland, M.D., MD    17-Sep-14 23:10, Abdomen Flat and Erect  Abdomen Flat and Erect  REASON FOR EXAM:    small bowel obstruction  COMMENTS:       PROCEDURE: DXR - DXR ABDOMEN 2 V FLAT AND ERECT  - Nov 18 2012 11:10PM     RESULT: Distended loops of small bowel are present. There is contrast   passed 5 urine in the urinary bladder likely from the previous CT.   Inferior vena cava filter is present. There is a moderate amount of air   in the stomach. The lung bases are grossly clear. Prominent costochondral   calcification is seen. There is a small amount of air in loops of colon.    IMPRESSION:  Findings concerning  for early partial small bowel   obstruction.    Dictation Site: 1    Verified By: Sundra Aland, M.D., MD    23-Sep-14 08:24, Abdomen Flat and Erect  Abdomen Flat and Erect  REASON FOR EXAM:    poor appetite  COMMENTS:       PROCEDURE: DXR - DXR ABDOMEN 2 V FLAT AND ERECT  - Nov 24 2012  8:24AM     RESULT: Comparison made to prior study 11/18/2012. Soft tissue structures   unremarkable. Surgical sutures noted in the rightabdomen. Inferior vena   caval filter noted. Persistent small bowel distention is present. No free   air is noted.    IMPRESSION:  Persistent mild small bowel distention. No free air.        Verified By: Osa Craver, M.D., MD    08-Jan-15 08:03, Abdomen AP Only  Abdomen AP Only  REASON FOR EXAM:    SBO, Compare.  COMMENTS:       PROCEDURE: DXR - DXR ABDOMEN AP ONLY  - Mar 11 2013  8:03AM     CLINICAL DATA:  Small bowel obstruction    EXAM:  ABDOMEN - 1 VIEW    COMPARISON:  Yesterday    FINDINGS:  Linear metallic object projects over the cardiac silhouette.  Contrast is in the bladder as well as with and decompressed  distal  colon. Dilated small bowel loops persist. No obvious free  intraperitoneal gas. Postoperative changes are noted. IVC filter is  in place.     IMPRESSION:  Partial small bowel obstruction pattern.    There is a metal object projecting over the cardiac silhouette.  Based on bruit being all of the prior abdomen CTs, the metal object  is a filter fragment embedded in the base of the right ventricle.  Critical Value/emergent results were called by telephone at the time  of interpretation on 03/11/2013 at 9:49 AM to Dr. Merlyn Lot, who  verbally acknowledged these results.    Electronically Signed    By: Maryclare Bean M.D.    On: 03/11/2013 09:50         Verified By: Jamas Lav, M.D.,  [place]:    15-Aug-14 14:57, CT Abdomen and Pelvis With Contrast  PACS Image    31-Aug-14 11:18, Chest Portable Single View  PACS Image    08-Sep-14 20:48, CT Abdomen and Pelvis With Contrast  PACS Image    09-Sep-14 12:01, CT Guide for Abscess Drainage (Specify)  PACS Image    17-Sep-14 17:04, Chest PA and Lateral  PACS Image    17-Sep-14 19:22, CT Abdomen and Pelvis With Contrast  PACS Image    17-Sep-14 23:10, Abdomen Flat and Erect  PACS Image    23-Sep-14 08:24, Abdomen Flat and Erect  PACS Image    07-Jan-15 15:41, CT Abdomen and Pelvis With Contrast  PACS Image    08-Jan-15 08:03, Abdomen AP Only  PACS Image  CT:    15-Aug-14 14:57, CT Abdomen and Pelvis With Contrast  CT Abdomen and Pelvis With Contrast  REASON FOR EXAM:    colon  polyp mass  COMMENTS:       PROCEDURE: KCT - KCT ABDOMEN/PELVIS W  - Oct 16 2012  2:57PM     RESULT:     Comparison is made to a prior study dated 12/20/2010.    Technique:  Helical 3 mm sections were obtained from the lungbases   through the pubic symphysis status post intravenous administration of 85  ml of Isovue-300 and oral contrast.    Findings:  The lung bases are unremarkable.  The liver, spleen, adrenals, pancreas, and right  kidney are unremarkable.   A smallcyst is identified within the left kidney.    There is no evidence of bowel obstruction. There is diffuse   diverticulosis within the sigmoid colon. The urinary bladder is   decompressed. The bladder wall is thickened and there is mild   inflammatorychange within the surrounding perivesicular fat. Small   punctate foci of air are identified within the urinary bladder which may   represent the sequela of prior instrumentation. Inflammatory changes are   identified in a region of interface betweenthe urinary bladder and the   sigmoid colon. A trace amount of fluid is appreciated within the pelvis.   No definite masses are identified within this area. If there is no   history of urinary bladder instrumentation, these findings are suspicious   for possible colovesicular fistula. Clinical correlation is recommended.   The sigmoid colon in this region demonstrates findings which may   represent mild bowel wall thickening. The distal sigmoid colon   demonstrates an amorphous  soft tissue appearing density. This is   appreciated on image 105 of the abdominal series. This area grossly   measures 3.5 x 2.5 cm. There is no evidence of drainable loculated fluid   collections.    IMPRESSION:      1.  Findings within the urinary bladder raising the suspicion of   cystitis. Reactive inflammatory changes are also of diagnostic   consideration and correlation with urinalysis is recommended.  2.  Findings within the distal sigmoid colon suspicious for a small   colonic mass. There are also findings suspicious for mild colitis and   again, this may represent reactive inflammatory change. Further     evaluation with direct visualization of the sigmoid colon findings is   recommended.  3.  Diverticulosis within the sigmoid colon.  4.  Moderate to large hiatal hernia.      Thank you for this opportunity to contribute to the care of your patient.         Verified  By: Mikki Santee, M.D., MD    08-Sep-14 20:48, CT Abdomen and Pelvis With Contrast  CT Abdomen and Pelvis With Contrast  REASON FOR EXAM:    (1) abd pain / vomiting s/p right colectomy; (2) abd   pain / vomiting s/p right c  COMMENTS:       PROCEDURE: CT  - CT ABDOMEN / PELVIS  W  - Nov 09 2012  8:48PM     RESULT: CT of the abdomen and pelvis is performed with 100 mL of   Isovue-300 iodinated intravenous contrast with oral contrast. Images   reconstructed at 3.0 mm slice thickness in the axial plane. Comparison is   made to the study of 10/16/2012.    In the area of the subhepatic region and previous splenic flexurethere   is fluid and air collection along the abdominal wall measuring   approximately 2.86 cm anterior to posterior x 3.68 cm transversely on   image 52. There is some adjacent surgical staple line medial to this.     There is an adjacent smaller fluid collection on image 60 it may be   connected measuring approximately 3.06 cm anterior to posterior anterior   to the lower pole the right kidney but appears to connect with the more   lateral and superior collection in  the area of image 59. This is   presumably postoperative abscess. There is colonic diverticulosis without   definite evidence of acute diverticulitis in the sigmoid region with a   diverticulosis is most prominent. There is no abnormal bowel distention.   There is no extravasation of oral contrast.    There is an inferior vena cava filter present. The aorta is normal in   caliber with scattered atherosclerotic calcification. There is a small   amount of free fluid present in the pelvis. The urinary bladder contains   a tiny amount of urine. The wall is thickened and this may be   artifactual. Correlate with urinalysis to assess for underlying urinary   tract infection. The kidneys show no obstruction. The adrenal glands and     pancreas appear to be unremarkable. The liver and spleen are within   normal  limits. A moderately large hiatal hernia is present.    IMPRESSION:  Right hemicolectomy with postoperative abscess inferior to   the right lobe of the liver an anterior to the midportion to lower   portion of the right kidney abutting the area of anastomosis. Surgical   consultation is recommended. Diverticulosis without evidence of acute   diverticulitis. There is a small amount of ascites. The urinary bladder   wall is thickened. This may be artifactual given the low volume.    Dictation Site: 6(*)        Verified By: Sundra Aland, M.D., MD    09-Sep-14 12:01, CT Guide for Abscess Drainage (Specify)  CT Guide for Abscess Drainage (Specify)  REASON FOR EXAM:    RUQ abscess  COMMENTS:       PROCEDURE: CT  - CT GUIDED ABSCESS DRAINAGE  - Nov 10 2012 12:01PM     RESULT: After discussing the risk and benefits of this procedure with the   patient , informed consent obtained. Local anesthesia administered with   1% lidocaine. 18-gauge Chiba needle advanced into the right abdominal   small fluid collection and 10 cc of pus removed. There were no   complications.    IMPRESSION:  Successful needle aspiration of tiny abscess in the right   abdomen. Sample sent for microbiology for further evaluation .      Verified By: Osa Craver, M.D., MD    17-Sep-14 19:22, CT Abdomen and Pelvis With Contrast  CT Abdomen and Pelvis With Contrast  REASON FOR EXAM:    (1) RLQ pain s/p R hemicolectomy and CT drainage of   post-op abscess; (2) see abo  COMMENTS:       PROCEDURE: CT  - CT ABDOMEN / PELVIS  W  - Nov 18 2012  7:22PM     RESULT: CT of the abdomen and pelvis is performed with 85 mL of  Isovue-300 iodinated intravenous contrast and oral contrast with images   reconstructed at 3 mm slice thickness in the axial plane. Comparison is   made to the previous exam of 11/09/2012.    The patient appears to be status post right hemicolectomy with   anastomosis in the right upper  quadrant. There is small bowel distention   that appears to be extending to the liver with small bowel just lateral   to the area of anastomosis visualized on image 63 with a transition     suggested between image63 and image 69. Inferior vena cava filter is   present. Sigmoid colon diverticulosis is present. The kidneys enhance   normally without  obstruction. The liver, spleen, pancreas and adrenal   glands appear unremarkable. The abdominal aorta is normal.There is   minimal ascites in the pelvis. There is no definite abscess. No   adenopathy is evident. There is a moderately large hiatal hernia present.   The gallbladder may have been removed. The lung bases show some minimal   fibrosis and/or atelectasis. There appears to be some fluid adjacent to   loops of distended small bowel in the left mid abdomen. No pneumatosis is   evident.    IMPRESSION:   1. Mechanical small bowel obstruction in the right upper quadrant   adjacent to the lateral aspectof the anastomosis. Surgical consultation   is recommended.  2. Hiatal hernia.  3. Sigmoid colon diverticulosis.  4. Scattered ascites.    Dictation Site: 6        Verified By: Sundra Aland, M.D., MD    07-Jan-15 15:41, CT Abdomen and Pelvis With Contrast  CT Abdomen and Pelvis With Contrast  REASON FOR EXAM:    (1) abd pain vomiting and constipation; (2) same  COMMENTS:       PROCEDURE: CT  - CT ABDOMEN / PELVIS  W  - Mar 10 2013  3:41PM     CLINICAL DATA:  Abdominal pain and vomiting.  Constipation.    EXAM:  CT ABDOMEN AND PELVIS WITH CONTRAST    TECHNIQUE:  Multidetector CT imaging of the abdomen and pelvis was performed  using the standard protocol following bolus administration of  intravenous contrast.  CONTRAST:  100 mL Isovue 370.    COMPARISON:  11/18/2012 CT.    FINDINGS:  Lung Bases: Patchy density is present in the posterior left lower  lobe favored to represent atelectasis based on its  dependent  position.    Liver:  Normal.    Spleen:  Normal.    Gallbladder:  Surgically absent.  Common bile duct: Postcholecystectomy dilation of the proximal  common bile duct.    Pancreas:  Normal.    Adrenal glands:  Normal bilaterally.    Kidneys: Simple 15 mm upper to interpolar left renal cyst with  smaller cystic lesions present, compatible with cysts. Normal  enhancement. Delayed excretion of contrast appears normal. The  ureters appear within normal limits.    Stomach: Large hiatal hernia distended with oral contrast. No  inflammatory changes of stomach.  Small bowel: Proximal small bowel is normal. There is progressive  distension of small bowel extending to an ileocolonic anastomosis in  the right mid abdomen. The anastomosis appears widely patent. There  is no mesenteric adenopathy. There are no decompressed loops of  small bowel to suggest a transition point and small bowel  obstruction.    Colon: Ascending colectomy. Liquid stool is present in the colon,  which is abnormal but nonspecific, most commonly associated with  enteric infection. Formed stool is present within the rectum.  Diverticulosis without diverticulitis.    Pelvic Genitourinary: Hysterectomy. Partially collapsed urinary  bladder.  Bones:  No aggressive osseous lesions.    Vasculature: IVC filter is present. Aortic atherosclerosis. No acute  vascular abnormality.    Body Wall: Normal.     IMPRESSION:  1. Distended fluid-filled small bowel without obstruction. Ascending  colectomy. Ileocolonic anastomosis appears patent. Although  nonspecific, the findings may be associated with ileus or enteric  infection.  2. Large hiatal hernia.  3. Left renal cysts.  4. Cholecystectomy and hysterectomy.  5. Unchanged IVC filter.      Electronically  Signed    By: Dereck Ligas M.D.    On: 03/10/2013 15:51         Verified By: Melvyn Novas, M.D.,   ASSESSMENT AND PLAN:   Assessment/Admission Diagnosis admitted with diarrhea, abdominal pain.  Gen surgery seeing IVC filter with a leg that has dislodged and appears embedded in right ventricle.   Plan She had an abdominal XRAy today and a leg of the IVC filter appears to be in the right ventrical.  In review of her CT scans from August 2014 until yesterday, the device appears the same in the cava meaning the leg has been dislodged for at least five months.  No CT from Sept 2013 until August 2014, so unable to assess when this occurred, but somewhere in that interim. Had a very long discussion with the patient regarding this issue.  This is not new and not symptomatic.  There is no way to say for sure that this will never cause a problem, but at this point the likelihood is low.  If she desires an evaluation from a CT surgeon to give his/her opinion that would be very reasonable.  She does not want that at this point.  I think the yield on attempting a percutaneous transvenous approach will be low for something this chronic.  Would be a good option if an acute issue.   Please call with any questions   level 3   Electronic Signatures: Algernon Huxley (MD)  (Signed 08-Jan-15 17:04)  Authored: Chief Complaint and History, PAST MEDICAL/SURGICAL HISTORY, ALLERGIES, HOME MEDICATIONS, Family and Social History, Review of Systems, Physical Exam, LABS, RADIOLOGY, Assessment and Plan   Last Updated: 08-Jan-15 17:04 by Algernon Huxley (MD)

## 2014-06-30 LAB — COMPREHENSIVE METABOLIC PANEL WITH GFR
Albumin: 3.8 g/dL
Alkaline Phosphatase: 67 U/L
Anion Gap: 8
BUN: 17 mg/dL
Bilirubin,Total: 0.2 mg/dL — ABNORMAL LOW
Calcium, Total: 9.1 mg/dL
Chloride: 103 mmol/L
Co2: 25 mmol/L
Creatinine: 1.02 mg/dL — ABNORMAL HIGH
EGFR (African American): 60 — ABNORMAL LOW
EGFR (Non-African Amer.): 52 — ABNORMAL LOW
Glucose: 91 mg/dL
Potassium: 3.8 mmol/L
SGOT(AST): 21 U/L
SGPT (ALT): 13 U/L — ABNORMAL LOW
Sodium: 136 mmol/L
Total Protein: 7.6 g/dL

## 2014-06-30 LAB — CBC CANCER CENTER
Basophil #: 0.1 "x10 3/mm "
Basophil %: 0.8 %
Eosinophil #: 0.2 "x10 3/mm "
Eosinophil %: 2.2 %
HCT: 31.1 % — ABNORMAL LOW
HGB: 9.8 g/dL — ABNORMAL LOW
Lymphocyte %: 32.4 %
Lymphs Abs: 2.7 "x10 3/mm "
MCH: 25 pg — ABNORMAL LOW
MCHC: 31.6 g/dL — ABNORMAL LOW
MCV: 79 fL — ABNORMAL LOW
Monocyte #: 1.2 "x10 3/mm " — ABNORMAL HIGH
Monocyte %: 14.3 %
Neutrophil #: 4.1 "x10 3/mm "
Neutrophil %: 50.3 %
Platelet: 328 "x10 3/mm "
RBC: 3.94 "x10 6/mm "
RDW: 17.7 % — ABNORMAL HIGH
WBC: 8.2 "x10 3/mm "

## 2014-07-02 ENCOUNTER — Inpatient Hospital Stay
Admission: EM | Admit: 2014-07-02 | Discharge: 2014-07-06 | DRG: 482 | Disposition: A | Discharge status: Skilled Nursing Facility | Payer: Medicare Other | Attending: Internal Medicine | Admitting: Internal Medicine

## 2014-07-02 DIAGNOSIS — S72009A Fracture of unspecified part of neck of unspecified femur, initial encounter for closed fracture: Secondary | ICD-10-CM | POA: Diagnosis present

## 2014-07-02 DIAGNOSIS — S72002A Fracture of unspecified part of neck of left femur, initial encounter for closed fracture: Secondary | ICD-10-CM

## 2014-07-02 DIAGNOSIS — F419 Anxiety disorder, unspecified: Secondary | ICD-10-CM | POA: Diagnosis present

## 2014-07-02 DIAGNOSIS — D649 Anemia, unspecified: Secondary | ICD-10-CM | POA: Diagnosis present

## 2014-07-02 DIAGNOSIS — Z7902 Long term (current) use of antithrombotics/antiplatelets: Secondary | ICD-10-CM | POA: Diagnosis not present

## 2014-07-02 DIAGNOSIS — Z86711 Personal history of pulmonary embolism: Secondary | ICD-10-CM

## 2014-07-02 DIAGNOSIS — W01190A Fall on same level from slipping, tripping and stumbling with subsequent striking against furniture, initial encounter: Secondary | ICD-10-CM | POA: Diagnosis present

## 2014-07-02 DIAGNOSIS — R251 Tremor, unspecified: Secondary | ICD-10-CM | POA: Diagnosis present

## 2014-07-02 DIAGNOSIS — K219 Gastro-esophageal reflux disease without esophagitis: Secondary | ICD-10-CM | POA: Diagnosis present

## 2014-07-02 DIAGNOSIS — Y92003 Bedroom of unspecified non-institutional (private) residence as the place of occurrence of the external cause: Secondary | ICD-10-CM

## 2014-07-02 DIAGNOSIS — Z791 Long term (current) use of non-steroidal anti-inflammatories (NSAID): Secondary | ICD-10-CM

## 2014-07-02 DIAGNOSIS — M810 Age-related osteoporosis without current pathological fracture: Secondary | ICD-10-CM | POA: Diagnosis present

## 2014-07-02 DIAGNOSIS — I10 Essential (primary) hypertension: Secondary | ICD-10-CM | POA: Diagnosis present

## 2014-07-02 DIAGNOSIS — Z8249 Family history of ischemic heart disease and other diseases of the circulatory system: Secondary | ICD-10-CM

## 2014-07-02 DIAGNOSIS — Z853 Personal history of malignant neoplasm of breast: Secondary | ICD-10-CM | POA: Diagnosis not present

## 2014-07-02 DIAGNOSIS — S72012A Unspecified intracapsular fracture of left femur, initial encounter for closed fracture: Principal | ICD-10-CM | POA: Diagnosis present

## 2014-07-02 DIAGNOSIS — Z79899 Other long term (current) drug therapy: Secondary | ICD-10-CM | POA: Diagnosis not present

## 2014-07-02 DIAGNOSIS — M797 Fibromyalgia: Secondary | ICD-10-CM | POA: Diagnosis present

## 2014-07-02 DIAGNOSIS — S0101XA Laceration without foreign body of scalp, initial encounter: Secondary | ICD-10-CM | POA: Diagnosis present

## 2014-07-02 DIAGNOSIS — S72002B Fracture of unspecified part of neck of left femur, initial encounter for open fracture type I or II: Secondary | ICD-10-CM

## 2014-07-02 DIAGNOSIS — W19XXXA Unspecified fall, initial encounter: Secondary | ICD-10-CM | POA: Diagnosis not present

## 2014-07-02 LAB — PROTIME-INR
INR: 1.2
Prothrombin Time: 15.2 secs — ABNORMAL HIGH

## 2014-07-02 LAB — BASIC METABOLIC PANEL
ANION GAP: 9 (ref 7–16)
BUN: 16 mg/dL
CALCIUM: 9 mg/dL
Chloride: 103 mmol/L
Co2: 26 mmol/L
Creatinine: 0.98 mg/dL
EGFR (Non-African Amer.): 54 — ABNORMAL LOW
Glucose: 123 mg/dL — ABNORMAL HIGH
Potassium: 4.2 mmol/L
Sodium: 138 mmol/L

## 2014-07-02 LAB — CBC
HCT: 30 % — ABNORMAL LOW (ref 35.0–47.0)
HGB: 9.4 g/dL — AB (ref 12.0–16.0)
MCH: 24.8 pg — AB (ref 26.0–34.0)
MCHC: 31.3 g/dL — AB (ref 32.0–36.0)
MCV: 79 fL — ABNORMAL LOW (ref 80–100)
Platelet: 277 10*3/uL (ref 150–440)
RBC: 3.79 10*6/uL — ABNORMAL LOW (ref 3.80–5.20)
RDW: 18 % — ABNORMAL HIGH (ref 11.5–14.5)
WBC: 12.7 10*3/uL — ABNORMAL HIGH (ref 3.6–11.0)

## 2014-07-02 LAB — APTT: ACTIVATED PTT: 32.6 s (ref 23.6–35.9)

## 2014-07-02 MED ORDER — CEFAZOLIN SODIUM 1-5 GM-% IV SOLN
1.0000 g | Freq: Once | INTRAVENOUS | Status: AC
  Administered 2014-07-03: 1 g via INTRAVENOUS
  Filled 2014-07-02: qty 50

## 2014-07-02 MED ORDER — GABAPENTIN 100 MG PO CAPS
200.0000 mg | ORAL_CAPSULE | Freq: Three times a day (TID) | ORAL | Status: DC
  Administered 2014-07-03 – 2014-07-06 (×9): 200 mg via ORAL
  Filled 2014-07-02 (×9): qty 2

## 2014-07-02 MED ORDER — ENOXAPARIN SODIUM 80 MG/0.8ML ~~LOC~~ SOLN
70.0000 mg | Freq: Two times a day (BID) | SUBCUTANEOUS | Status: DC
  Administered 2014-07-03: 80 mg via SUBCUTANEOUS
  Administered 2014-07-04: 70 mg via SUBCUTANEOUS
  Filled 2014-07-02 (×5): qty 0.8

## 2014-07-02 MED ORDER — MORPHINE SULFATE 2 MG/ML IJ SOLN
2.0000 mg | INTRAMUSCULAR | Status: DC | PRN
  Administered 2014-07-03 – 2014-07-04 (×2): 2 mg via INTRAVENOUS
  Filled 2014-07-02 (×4): qty 1

## 2014-07-02 MED ORDER — SERTRALINE HCL 100 MG PO TABS
100.0000 mg | ORAL_TABLET | Freq: Every day | ORAL | Status: DC
  Administered 2014-07-04 – 2014-07-06 (×3): 100 mg via ORAL
  Filled 2014-07-02 (×3): qty 1

## 2014-07-02 MED ORDER — LEVOTHYROXINE SODIUM 25 MCG PO TABS
50.0000 ug | ORAL_TABLET | Freq: Every day | ORAL | Status: DC
  Administered 2014-07-04 – 2014-07-06 (×3): 50 ug via ORAL
  Filled 2014-07-02 (×4): qty 2

## 2014-07-02 MED ORDER — SODIUM CHLORIDE 0.9 % IV SOLN
INTRAVENOUS | Status: DC
  Administered 2014-07-02 – 2014-07-05 (×5): via INTRAVENOUS

## 2014-07-02 MED ORDER — DILTIAZEM HCL 30 MG PO TABS
30.0000 mg | ORAL_TABLET | Freq: Four times a day (QID) | ORAL | Status: DC
  Administered 2014-07-02 – 2014-07-06 (×14): 30 mg via ORAL
  Filled 2014-07-02 (×14): qty 1

## 2014-07-02 MED ORDER — PANTOPRAZOLE SODIUM 40 MG IV SOLR
40.0000 mg | Freq: Two times a day (BID) | INTRAVENOUS | Status: DC
  Administered 2014-07-03 – 2014-07-04 (×3): 40 mg via INTRAVENOUS
  Filled 2014-07-02 (×3): qty 40

## 2014-07-02 MED ORDER — ONDANSETRON HCL 4 MG/2ML IJ SOLN: 4.0000 mg | INTRAMUSCULAR | Status: DC | PRN

## 2014-07-02 MED ORDER — ALPRAZOLAM 0.5 MG PO TABS
0.5000 mg | ORAL_TABLET | Freq: Four times a day (QID) | ORAL | Status: DC | PRN
Start: 2014-07-03 — End: 2014-07-06
  Administered 2014-07-03 – 2014-07-05 (×6): 0.5 mg via ORAL
  Filled 2014-07-02 (×6): qty 1

## 2014-07-02 MED ORDER — IPRATROPIUM-ALBUTEROL 0.5-2.5 (3) MG/3ML IN SOLN
3.0000 mL | Freq: Four times a day (QID) | RESPIRATORY_TRACT | Status: DC
  Administered 2014-07-03 (×2): 3 mL via RESPIRATORY_TRACT
  Filled 2014-07-02 (×3): qty 3

## 2014-07-02 MED ORDER — ACETAMINOPHEN 325 MG PO TABS
325.0000 mg | ORAL_TABLET | ORAL | Status: DC | PRN
  Administered 2014-07-06: 650 mg via ORAL
  Filled 2014-07-02 (×2): qty 2

## 2014-07-02 MED ORDER — TRAMADOL HCL 50 MG PO TABS
50.0000 mg | ORAL_TABLET | Freq: Four times a day (QID) | ORAL | Status: DC | PRN
  Administered 2014-07-03 – 2014-07-06 (×7): 50 mg via ORAL
  Filled 2014-07-02 (×11): qty 1

## 2014-07-02 MED ORDER — SODIUM CHLORIDE 0.9 % IJ SOLN: 3.0000 mL | INTRAMUSCULAR | Status: DC | PRN

## 2014-07-03 ENCOUNTER — Inpatient Hospital Stay: Payer: Medicare Other | Admitting: Anesthesiology

## 2014-07-03 ENCOUNTER — Encounter
Admission: EM | Disposition: A | Discharge status: Skilled Nursing Facility | Payer: Self-pay | Source: Home / Self Care | Attending: Internal Medicine

## 2014-07-03 ENCOUNTER — Inpatient Hospital Stay: Payer: Medicare Other

## 2014-07-03 ENCOUNTER — Encounter: Payer: Self-pay | Admitting: Internal Medicine

## 2014-07-03 DIAGNOSIS — W19XXXA Unspecified fall, initial encounter: Secondary | ICD-10-CM

## 2014-07-03 DIAGNOSIS — S72002A Fracture of unspecified part of neck of left femur, initial encounter for closed fracture: Secondary | ICD-10-CM

## 2014-07-03 HISTORY — PX: HIP PINNING,CANNULATED: SHX1758

## 2014-07-03 LAB — BASIC METABOLIC PANEL
Anion gap: 7 (ref 5–15)
BUN: 11 mg/dL (ref 6–20)
CALCIUM: 8.6 mg/dL — AB (ref 8.9–10.3)
CO2: 27 mmol/L (ref 22–32)
Chloride: 104 mmol/L (ref 101–111)
Creatinine, Ser: 0.89 mg/dL (ref 0.44–1.00)
GFR calc Af Amer: >60 mL/min (ref >60)
GFR, EST NON AFRICAN AMERICAN: 59 mL/min — AB (ref >60)
GLUCOSE: 131 mg/dL — AB (ref 65–99)
Potassium: 4.5 mmol/L (ref 3.5–5.1)
Sodium: 138 mmol/L (ref 135–145)

## 2014-07-03 LAB — CBC
HCT: 28.9 % — ABNORMAL LOW (ref 35.0–47.0)
Hemoglobin: 9.2 g/dL — ABNORMAL LOW (ref 12.0–16.0)
MCH: 25.3 pg — ABNORMAL LOW (ref 26.0–34.0)
MCHC: 31.9 g/dL — AB (ref 32.0–36.0)
MCV: 79.3 fL — AB (ref 80.0–100.0)
PLATELETS: 255 10*3/uL (ref 150–440)
RBC: 3.65 MIL/uL — ABNORMAL LOW (ref 3.80–5.20)
RDW: 17.5 % — AB (ref 11.5–14.5)
WBC: 9.7 10*3/uL (ref 3.6–11.0)

## 2014-07-03 SURGERY — OPEN REDUCTION INTERNAL FIXATION HIP
Anesthesia: General | Laterality: Left

## 2014-07-03 SURGERY — FIXATION, FEMUR, NECK, PERCUTANEOUS, USING SCREW
Anesthesia: General | Site: Hip | Laterality: Left

## 2014-07-03 MED ORDER — LACTATED RINGERS IV SOLN
INTRAVENOUS | Status: DC | PRN
  Administered 2014-07-03 (×2): via INTRAVENOUS

## 2014-07-03 MED ORDER — FENTANYL CITRATE (PF) 100 MCG/2ML IJ SOLN
INTRAMUSCULAR | Status: DC | PRN
  Administered 2014-07-03 (×2): 50 ug via INTRAVENOUS

## 2014-07-03 MED ORDER — ONDANSETRON HCL 4 MG/2ML IJ SOLN
INTRAMUSCULAR | Status: DC | PRN
  Administered 2014-07-03: 4 mg via INTRAVENOUS

## 2014-07-03 MED ORDER — ONDANSETRON HCL 4 MG/2ML IJ SOLN: 4.0000 mg | Freq: Once | INTRAMUSCULAR | Status: DC | PRN

## 2014-07-03 MED ORDER — LIDOCAINE HCL (CARDIAC) 20 MG/ML IV SOLN
INTRAVENOUS | Status: DC | PRN
  Administered 2014-07-03: 30 mg via INTRAVENOUS

## 2014-07-03 MED ORDER — IPRATROPIUM-ALBUTEROL 0.5-2.5 (3) MG/3ML IN SOLN: 3.0000 mL | RESPIRATORY_TRACT | Status: DC | PRN

## 2014-07-03 MED ORDER — MIDAZOLAM HCL 2 MG/2ML IJ SOLN
INTRAMUSCULAR | Status: DC | PRN
  Administered 2014-07-03: 2 mg via INTRAVENOUS

## 2014-07-03 MED ORDER — EPHEDRINE SULFATE 50 MG/ML IJ SOLN
INTRAMUSCULAR | Status: DC | PRN
  Administered 2014-07-03: 20 mg via INTRAVENOUS

## 2014-07-03 MED ORDER — KETAMINE HCL 50 MG/ML IJ SOLN
INTRAMUSCULAR | Status: DC | PRN
  Administered 2014-07-03: 25 mg via INTRAMUSCULAR

## 2014-07-03 MED ORDER — HYDROMORPHONE HCL 1 MG/ML IJ SOLN
0.5000 mg | INTRAMUSCULAR | Status: DC | PRN
  Administered 2014-07-03: 0.5 mg via INTRAVENOUS

## 2014-07-03 MED ORDER — FENTANYL CITRATE (PF) 100 MCG/2ML IJ SOLN
25.0000 ug | INTRAMUSCULAR | Status: AC | PRN
  Administered 2014-07-03 (×4): 25 ug via INTRAVENOUS

## 2014-07-03 MED ORDER — PROPOFOL INFUSION 10 MG/ML OPTIME
INTRAVENOUS | Status: DC | PRN
  Administered 2014-07-03: 100 mL via INTRAVENOUS

## 2014-07-03 MED ORDER — SUCCINYLCHOLINE CHLORIDE 20 MG/ML IJ SOLN
INTRAMUSCULAR | Status: DC | PRN
  Administered 2014-07-03: 20 mg via INTRAVENOUS

## 2014-07-03 SURGICAL SUPPLY — 35 items
BAG COUNTER SPONGE EZ (MISCELLANEOUS) IMPLANT
BIT DRILL CANN LRG QC 5X300 (BIT) ×3 IMPLANT
CANISTER SUCT 1200ML W/VALVE (MISCELLANEOUS) ×3 IMPLANT
COUNTER SPONGE BAG EZ (MISCELLANEOUS)
DURAPREP 26ML APPLICATOR (WOUND CARE) ×3 IMPLANT
GAUZE SPONGE 4X4 12PLY STRL (GAUZE/BANDAGES/DRESSINGS) ×3 IMPLANT
GLOVE BIOGEL PI IND STRL 8 (GLOVE) ×1 IMPLANT
GLOVE BIOGEL PI INDICATOR 8 (GLOVE) ×2
GLOVE INDICATOR 7.5 STRL GRN (GLOVE) ×3 IMPLANT
GLOVE SURG ORTHO 8.0 STRL STRW (GLOVE) ×3 IMPLANT
GLOVE SURG SYN 6.5 ES PF (GLOVE) ×3 IMPLANT
GLOVE SURG SYN 7.0 (GLOVE) ×3 IMPLANT
GLOVE SURG SYN 7.5  E (GLOVE) ×2
GLOVE SURG SYN 7.5 E (GLOVE) ×1 IMPLANT
GLOVE SURG SYN 8.0 (GLOVE) ×3 IMPLANT
GOWN STRL REUS W/ TWL LRG LVL3 (GOWN DISPOSABLE) ×1 IMPLANT
GOWN STRL REUS W/TWL LRG LVL3 (GOWN DISPOSABLE) ×2
GUIDEWIRE THREADED 2.8 (WIRE) ×9 IMPLANT
IRRIGATION STRYKERFLOW (MISCELLANEOUS) ×1 IMPLANT
IRRIGATOR STRYKERFLOW (MISCELLANEOUS) ×3
KIT RM TURNOVER CYSTO AR (KITS) ×3 IMPLANT
MAT FLOOR SUCTION 50X34 (MISCELLANEOUS) ×3 IMPLANT
NS IRRIG 1000ML POUR BTL (IV SOLUTION) ×3 IMPLANT
PACK HIP COMPR (MISCELLANEOUS) ×3 IMPLANT
PAD GROUND ADULT SPLIT (MISCELLANEOUS) ×3 IMPLANT
REAMER ROD DEEP FLUTE 2.5X950 (INSTRUMENTS) ×3 IMPLANT
SCREW CANN 16 THRD/70 7.3 (Screw) ×3 IMPLANT
SCREW CANN 16 THRD/75 7.3 (Screw) ×3 IMPLANT
SCREW CANN 16 THRD/90 7.3 (Screw) ×3 IMPLANT
STAPLER SKIN PROX 35W (STAPLE) ×3 IMPLANT
SUT VIC AB 0 CT1 36 (SUTURE) ×3 IMPLANT
SUT VIC AB 1 CT1 36 (SUTURE) ×3 IMPLANT
SUT VIC AB 2-0 CT1 27 (SUTURE) ×2
SUT VIC AB 2-0 CT1 TAPERPNT 27 (SUTURE) ×1 IMPLANT
TAPE MICROFOAM 4IN (TAPE) ×3 IMPLANT

## 2014-07-03 SURGICAL SUPPLY — 44 items
AUTOTRANSFUS HAS 1/8 (MISCELLANEOUS)
BANDAGE ELASTIC 6 CLIP NS LF (GAUZE/BANDAGES/DRESSINGS) IMPLANT
BLADE SAW SAG 25.4X90 (BLADE) IMPLANT
BLADE SURG SZ10 CARB STEEL (BLADE) IMPLANT
BNDG COHESIVE 4X5 TAN STRL (GAUZE/BANDAGES/DRESSINGS) IMPLANT
CANISTER SUCT 3000ML (MISCELLANEOUS) IMPLANT
CATH FOL LEG HOLDER (MISCELLANEOUS) IMPLANT
CATH TRAY 16F METER LATEX (MISCELLANEOUS) IMPLANT
COOLER POLAR GLACIER W/PUMP (MISCELLANEOUS) IMPLANT
DRAPE C-ARM XRAY 36X54 (DRAPES) IMPLANT
DRAPE TABLE BACK 80X90 (DRAPES) IMPLANT
DRSG DERMACEA 8X12 NADH (GAUZE/BANDAGES/DRESSINGS) IMPLANT
DURAPREP 26ML APPLICATOR (WOUND CARE) IMPLANT
GAUZE SPONGE 4X4 12PLY STRL (GAUZE/BANDAGES/DRESSINGS) IMPLANT
GLOVE BIOGEL PI IND STRL 8 (GLOVE) IMPLANT
GLOVE BIOGEL PI INDICATOR 8 (GLOVE)
GLOVE INDICATOR 7.5 STRL GRN (GLOVE) IMPLANT
GLOVE SURG ORTHO 8.0 STRL STRW (GLOVE) IMPLANT
GOWN STRL REUS W/ TWL LRG LVL3 (GOWN DISPOSABLE) IMPLANT
GOWN STRL REUS W/TWL LRG LVL3 (GOWN DISPOSABLE)
HANDPIECE SUCTION TUBG SURGILV (MISCELLANEOUS) IMPLANT
KIT RM TURNOVER STRD PROC AR (KITS) IMPLANT
NEEDLE FILTER BLUNT 18X 1/2SAF (NEEDLE)
NEEDLE FILTER BLUNT 18X1 1/2 (NEEDLE) IMPLANT
NS IRRIG 1000ML POUR BTL (IV SOLUTION) IMPLANT
PACK EXTREMITY ARMC (MISCELLANEOUS) IMPLANT
PAD ABD DERMACEA PRESS 5X9 (GAUZE/BANDAGES/DRESSINGS) IMPLANT
PAD GROUND ADULT SPLIT (MISCELLANEOUS) IMPLANT
PAD WRAPON POLAR KNEE (MISCELLANEOUS) IMPLANT
SCREW CANN 16 THRD/70 7.3 (Screw) IMPLANT
SCREW CANN 16 THRD/75 7.3 (Screw) IMPLANT
SCREW CANN 16 THRD/90 7.3 (Screw) IMPLANT
SOL .9 NS 3000ML IRR  AL (IV SOLUTION)
SOL .9 NS 3000ML IRR UROMATIC (IV SOLUTION) IMPLANT
SPONGE LAP 18X18 5 PK (GAUZE/BANDAGES/DRESSINGS) IMPLANT
STOCKINETTE M/LG 89821 (MISCELLANEOUS) IMPLANT
SUCTION FRAZIER TIP 10 FR DISP (SUCTIONS) IMPLANT
SUT VIC AB 0 CT1 36 (SUTURE) IMPLANT
SUT VIC AB 2-0 CT1 (SUTURE) IMPLANT
SYR 20CC LL (SYRINGE) IMPLANT
SYSTEM AUTOTRANSFUS DUAL TROCR (MISCELLANEOUS) IMPLANT
TOWEL OR 17X26 4PK STRL BLUE (TOWEL DISPOSABLE) IMPLANT
WATER STERILE IRR 1000ML POUR (IV SOLUTION) IMPLANT
WRAPON POLAR PAD KNEE (MISCELLANEOUS)

## 2014-07-03 NOTE — Plan of Care (Signed)
Problem: Consults Goal: Diagnosis- Total Joint Replacement Outcome: Progressing Primary Total Hip     

## 2014-07-03 NOTE — Addendum Note (Signed)
Addendum  created 07/03/14 1423 by Molli Barrows, MD   Modules edited: Anesthesia Attestations

## 2014-07-03 NOTE — Progress Notes (Signed)
PT Cancellation Note  Patient Details Name: Lauren Mccann MRN: [id number] DOB: Feb 14, 1931   Cancelled Treatment:     PT consult received and chart reviewed.  Patient status post L hip ORIF this AM, just returned to floor from procedure.  Will hold PT evaluation until next date.  RN informed/aware and in agreement.   Ellison Hughs 07/03/2014, 2:49 PM

## 2014-07-03 NOTE — Op Note (Signed)
07/02/2014 - 07/03/2014  9:36 AM  PATIENT:  Lauren Mccann    PRE-OPERATIVE DIAGNOSIS:  left hip fracture  POST-OPERATIVE DIAGNOSIS:  Same  PROCEDURE:  CANNULATED HIP PINNING  SURGEON:  Stacy Gardner, MD  PHYSICIAN ASSISTANT: None  ANESTHESIA:   General  PRE-OP ANTIBIOTICS: 1 gm Ancef  FLUIDS: 400 mL crystalloid  PREOPERATIVE INDICATIONS:  [name] is a  81 y.o. female who fell and was found to have a diagnosis of left hip fracture who elected for surgical management.    The risks benefits and alternatives were discussed with the patient preoperatively including but not limited to the risks of infection, bleeding, nerve injury, cardiopulmonary complications, blood clots, malunion, nonunion, avascular necrosis, the need for revision surgery, the potential for conversion to hemiarthroplasty, among others, and the patient was willing to proceed.  OPERATIVE IMPLANTS: 7.3 mm cannulated screws x3  OPERATIVE FINDINGS: Clinical osteoporosis with weak bone, proximal femur  OPERATIVE PROCEDURE: The patient was brought to the operating room and placed in supine position. IV antibiotics were given. General anesthesia administered. The patient was placed on the fracture table. The operative extremity was positioned, without any significant reduction maneuver and was prepped and draped in usual sterile fashion.  Time out was performed.  3 guidewires were introduced Into an inverted triangle configuration. The lengths were measured. The reduction was slightly valgus, and near-anatomic. I opened the cortex with a cannulated drill, and then placed the screws into position. Satisfactory fixation was achieved.  The wounds were irrigated copiously, and repaired with nylon suture and sterile gauze. There no complications and the patient tolerated the procedure well.  The patient will be foot flat weightbearing for 6 weeks post-op.

## 2014-07-03 NOTE — Anesthesia Postprocedure Evaluation (Signed)
  Anesthesia Post-op Note  Patient: Lauren Mccann  Procedure(s) Performed: Procedure(s): CANNULATED HIP PINNING (Left)  Anesthesia type:General LMA  Patient location: PACU  Post pain: Pain level controlled  Post assessment: Post-op Vital signs reviewed, Patient's Cardiovascular Status Stable, Respiratory Function Stable, Patent Airway and No signs of Nausea or vomiting  Post vital signs: Reviewed and stable  Last Vitals:  Filed Vitals:   07/03/14 0700  BP: 133/50  Pulse: 78  Temp: 36.9 C  Resp: 20    Level of consciousness: awake, alert  and patient cooperative  Complications: No apparent anesthesia complications

## 2014-07-03 NOTE — Progress Notes (Signed)
No post op antibiotics ordered after surgery.  This was clarified with Dr Daine Gip and he does not want any ordered

## 2014-07-03 NOTE — Progress Notes (Signed)
S/p THR. Pt medicated for pain with relief. Instructed by RN how to use incentive spirometer for low grade temp. Dsg dry and intact. No acute distress noted.

## 2014-07-03 NOTE — Transfer of Care (Signed)
Immediate Anesthesia Transfer of Care Note  Patient: Lauren Mccann  Procedure(s) Performed: Procedure(s): CANNULATED HIP PINNING (Left)  Patient Location: PACU  Anesthesia Type:General  Level of Consciousness: alert   Airway & Oxygen Therapy: Patient connected to nasal cannula oxygen  Post-op Assessment: Report given to RN  Post vital signs: stable  Last Vitals:  Filed Vitals:   07/03/14 0944  BP:   Pulse:   Temp: 37.6 C  Resp:     Complications: No apparent anesthesia complications

## 2014-07-03 NOTE — Progress Notes (Signed)
Subjective: Pt admitted with Left hip fracture. S/P surgery about 2 hrs ago. Having mild tremor but no other complaints.  Objective: Vital signs in last 24 hours: Temp:  [97.5 F (36.4 C)-99.7 F (37.6 C)] 97.9 F (36.6 C) (05/01 1539) Pulse Rate:  [59-105] 99 (05/01 1539) Resp:  [16-20] 16 (05/01 1539) BP: (119-158)/(50-79) 130/72 mmHg (05/01 1539) SpO2:  [89 %-99 %] 94 % (05/01 1539) Weight:  [75.7 kg (166 lb 14.2 oz)] 75.7 kg (166 lb 14.2 oz) (04/30 2003) Weight change:  Last BM Date: 07/02/14  Intake/Output from previous day: 04/30 0701 - 05/01 0700 In: 715 [I.V.:715] Out: -  Intake/Output this shift: Total I/O In: 700 [I.V.:700] Out: 970 [Urine:950; Blood:20]   General appearance: alert and no distress Head: Mild laceration on right occipital area Eyes: conjunctivae/corneas clear. PERRL, EOM's intact. Fundi benign. Resp: clear to auscultation bilaterally, normal percussion bilaterally and No use of accessary muscles Cardio: regular rate and rhythm, S1, S2 normal, no murmur, click, rub or gallop GI: soft, non-tender; bowel sounds normal; no masses,  no organomegaly Extremities: mild lower ext edema Skin: Skin color, texture, turgor normal. No rashes or lesions Neurologic: Alert and oriented X 3, normal strength and tone. Mild attention tremor. Lab Results:  Recent Labs  07/02/14 1755 07/03/14 0358  WBC 12.7* 9.7  HGB 9.4* 9.2*  HCT 30.0* 28.9*  PLT 277 255   BMET  Recent Labs  07/02/14 1755 07/03/14 0358  NA 138 138  K 4.2 4.5  CL 103 104  CO2 26 27  GLUCOSE 123* 131*  BUN 16 11  CREATININE 0.98 0.89  CALCIUM 9.0 8.6*    Studies/Results: Dg Fluoro Rm Gt 60 Min  07/03/2014   CLINICAL DATA:  Left hip fracture  EXAM: FLOURO RM GT 60 MIN; LEFT HIP (WITH PELVIS) 2-3 VIEWS  FLUOROSCOPY TIME:  Fluoroscopy Time (in minutes and seconds): 1 minutes 58 seconds  Number of Acquired Images:  2  COMPARISON:  CT left hip dated 07/02/2014  FINDINGS: Intraoperative  fluoroscopic images during ORIF of a subcapital left proximal femur fracture.  Three cannulated cancellous screws transfix the fracture, which is in near anatomic alignment and position.  IMPRESSION: Intraoperative fluoroscopic images during ORIF of a subcapital left proximal femur fracture, as above.   Electronically Signed   By: Julian Hy M.D.   On: 07/03/2014 10:28   Ct Hip Left Wo Contrast  07/02/2014   CLINICAL DATA:  Further evaluation of known left femur fracture  EXAM: CT OF THE LEFT HIP WITHOUT CONTRAST  TECHNIQUE: Multidetector CT imaging of the left hip was performed according to the standard protocol. Multiplanar CT image reconstructions were also generated.  COMPARISON:  07/02/2014  FINDINGS: There is a fracture there is mild impaction at the fracture site. No significant joint effusion or hematoma. Pubic rami are intact. No evidence of femoral dislocation. Of the proximal femur are at the junction of the femoral neck and femoral head.  IMPRESSION: Fracture the proximal left femur best classified as subcapital.   Electronically Signed   By: Skipper Cliche M.D.   On: 07/02/2014 18:30   Dg Hip Unilat With Pelvis 2-3 Views Left  07/03/2014   CLINICAL DATA:  Left hip fracture. Status post internal fixation. Initial encounter.  EXAM: LEFT HIP (WITH PELVIS) 2-3 VIEWS  COMPARISON:  07/03/2014  FINDINGS: Three internal fixation screws are seen transfixing a left femoral neck fracture in near anatomic alignment. No evidence other fracture or dislocation.  IMPRESSION: Internal fixation  of left femoral neck fracture in near anatomic alignment.   Electronically Signed   By: Earle Gell M.D.   On: 07/03/2014 12:39   Dg Hip Unilat With Pelvis 2-3 Views Left  07/03/2014   CLINICAL DATA:  Left hip fracture  EXAM: FLOURO RM GT 60 MIN; LEFT HIP (WITH PELVIS) 2-3 VIEWS  FLUOROSCOPY TIME:  Fluoroscopy Time (in minutes and seconds): 1 minutes 58 seconds  Number of Acquired Images:  2  COMPARISON:  CT left  hip dated 07/02/2014  FINDINGS: Intraoperative fluoroscopic images during ORIF of a subcapital left proximal femur fracture.  Three cannulated cancellous screws transfix the fracture, which is in near anatomic alignment and position.  IMPRESSION: Intraoperative fluoroscopic images during ORIF of a subcapital left proximal femur fracture, as above.   Electronically Signed   By: Julian Hy M.D.   On: 07/03/2014 10:28   Ct Head Limited W/o Cm  07/02/2014   CLINICAL DATA:  Tripped at home hit the back of head in a fall no loss of consciousness on anti coagulation  EXAM: CT HEAD WITHOUT CONTRAST  TECHNIQUE: Contiguous axial images were obtained from the base of the skull through the vertex without intravenous contrast.  COMPARISON:  01/02/2014  FINDINGS: Posterior right scalp hematoma. No skull fracture. No hemorrhage or extra-axial fluid. Stable diffuse atrophy and low attenuation in the white matter diffusely. No evidence of mass. No hydrocephalus.  IMPRESSION: Scalp hematoma with no acute intracranial abnormalities.   Electronically Signed   By: Skipper Cliche M.D.   On: 07/02/2014 16:45    Medications:  Scheduled: . diltiazem  30 mg Oral 4 times per day  . enoxaparin (LOVENOX) injection  70 mg Subcutaneous Q12H  . gabapentin  200 mg Oral TID  . ipratropium-albuterol  3 mL Nebulization QID  . levothyroxine  50 mcg Oral QAC breakfast  . pantoprazole (PROTONIX) IV  40 mg Intravenous Q12H  . sertraline  100 mg Oral Daily   Continuous: . sodium chloride 75 mL/hr at 07/03/14 1146   QZR:[id number], ALPRAZolam, morphine injection, ondansetron (ZOFRAN) IV, sodium chloride, traMADol  Assessment/Plan: 1. Left Hip Fracture: Now s/p surgical repair. Proceeding with PT as per ortho.  2. Pulmonary Embolisim: Bridging Xarelto with lovenox for surgery. Will restart Xarelto in am. Also has IVC filter.  3. Attention Tremor: Has history of this periodically under stress. No further workup at this  time.  4. Scalp Laceration: Head CT negative for bleed.   5. Anxiety: On chronic benzoes. Continue home dose.   LOS: 1 day   Baxter Hire 07/03/2014, 4:23 PM

## 2014-07-03 NOTE — Anesthesia Preprocedure Evaluation (Addendum)
Anesthesia Evaluation  Patient identified by MRN, date of birth, ID band Patient awake    Reviewed: Allergy & Precautions, H&P , NPO status , Patient's Chart, lab work & pertinent test results  Airway Mallampati: II  TM Distance: <3 FB Neck ROM: full  Mouth opening: Limited Mouth Opening  Dental  (+) Teeth Intact   Pulmonary neg pulmonary ROS,  breath sounds clear to auscultation        Cardiovascular negative cardio ROS  Rhythm:regular Rate:Normal  Hx of [place] Filter   Neuro/Psych negative neurological ROS  negative psych ROS   GI/Hepatic negative GI ROS, Neg liver ROS,   Endo/Other  negative endocrine ROS  Renal/GU negative Renal ROS     Musculoskeletal   Abdominal   Peds  Hematology   Anesthesia Other Findings   Reproductive/Obstetrics negative OB ROS                            Anesthesia Physical Anesthesia Plan  ASA: III  Anesthesia Plan: General LMA   Post-op Pain Management:    Induction:   Airway Management Planned:   Additional Equipment:   Intra-op Plan:   Post-operative Plan:   Informed Consent: I have reviewed the patients History and Physical, chart, labs and discussed the procedure including the risks, benefits and alternatives for the proposed anesthesia with the patient or authorized representative who has indicated his/her understanding and acceptance.     Plan Discussed with: Anesthesiologist and CRNA  Anesthesia Plan Comments:         Anesthesia Quick Evaluation

## 2014-07-03 NOTE — Consult Note (Signed)
The risks benefits and alternatives were discussed with the patient including but not limited to the risks of nonoperative treatment, versus surgical intervention including infection, bleeding, nerve injury, the need for revision surgery, dislocation, blood clots, cardiopulmonary complications, morbidity, mortality, among others, and she was willing to proceed.

## 2014-07-04 DIAGNOSIS — S72009A Fracture of unspecified part of neck of unspecified femur, initial encounter for closed fracture: Secondary | ICD-10-CM | POA: Diagnosis present

## 2014-07-04 HISTORY — DX: Fracture of unspecified part of neck of unspecified femur, initial encounter for closed fracture: S72.009A

## 2014-07-04 LAB — GLUCOSE, CAPILLARY: Glucose-Capillary: 136 mg/dL — ABNORMAL HIGH (ref 70–99)

## 2014-07-04 MED ORDER — RIVAROXABAN 20 MG PO TABS
20.0000 mg | ORAL_TABLET | Freq: Every day | ORAL | Status: DC
  Administered 2014-07-04 – 2014-07-06 (×3): 20 mg via ORAL
  Filled 2014-07-04 (×4): qty 1

## 2014-07-04 NOTE — Progress Notes (Signed)
Subjective: Pt admitted with Left hip fracture. S/P surgery . Feels better today but does not want to get out of bed. No chest pain or SOB.  Objective: Vital signs in last 24 hours: Temp:  [97.5 F (36.4 C)-100.5 F (38.1 C)] 97.7 F (36.5 C) (05/02 0738) Pulse Rate:  [81-108] 81 (05/02 0738) Resp:  [16-22] 18 (05/02 0738) BP: (98-149)/(57-79) 119/78 mmHg (05/02 0738) SpO2:  [89 %-97 %] 96 % (05/02 0738) Weight change:  Last BM Date: 07/03/14  Intake/Output from previous day: 05/01 0701 - 05/02 0700 In: 2125 [I.V.:2125] Out: 2195 [Urine:2175; Blood:20] Intake/Output this shift: Total I/O In: -  Out: 650 [Urine:650]   General appearance: alert and no distress Head: Mild laceration on right occipital area Eyes: conjunctivae/corneas clear. PERRL, EOM's intact. Resp: clear to auscultation bilaterally, normal percussion bilaterally and No use of accessary muscles Cardio: regular rate and rhythm, S1, S2 normal, no murmur,  GI: soft, non-tender; bowel sounds normal; no masses,  no organomegaly Extremities: mild lower ext edema Skin: Skin color, texture, turgor normal. No rashes or lesions Neurologic: Alert and oriented X 3, normal strength and tone. Mild attention tremor improved from yesterday. Lab Results:  Recent Labs  07/02/14 1755 07/03/14 0358  WBC 12.7* 9.7  HGB 9.4* 9.2*  HCT 30.0* 28.9*  PLT 277 255   BMET  Recent Labs  07/02/14 1755 07/03/14 0358  NA 138 138  K 4.2 4.5  CL 103 104  CO2 26 27  GLUCOSE 123* 131*  BUN 16 11  CREATININE 0.98 0.89  CALCIUM 9.0 8.6*    Studies/Results: Dg Fluoro Rm Gt 60 Min  07/03/2014   CLINICAL DATA:  Left hip fracture  EXAM: FLOURO RM GT 60 MIN; LEFT HIP (WITH PELVIS) 2-3 VIEWS  FLUOROSCOPY TIME:  Fluoroscopy Time (in minutes and seconds): 1 minutes 58 seconds  Number of Acquired Images:  2  COMPARISON:  CT left hip dated 07/02/2014  FINDINGS: Intraoperative fluoroscopic images during ORIF of a subcapital left  proximal femur fracture.  Three cannulated cancellous screws transfix the fracture, which is in near anatomic alignment and position.  IMPRESSION: Intraoperative fluoroscopic images during ORIF of a subcapital left proximal femur fracture, as above.   Electronically Signed   By: Julian Hy M.D.   On: 07/03/2014 10:28   Ct Hip Left Wo Contrast  07/02/2014   CLINICAL DATA:  Further evaluation of known left femur fracture  EXAM: CT OF THE LEFT HIP WITHOUT CONTRAST  TECHNIQUE: Multidetector CT imaging of the left hip was performed according to the standard protocol. Multiplanar CT image reconstructions were also generated.  COMPARISON:  07/02/2014  FINDINGS: There is a fracture there is mild impaction at the fracture site. No significant joint effusion or hematoma. Pubic rami are intact. No evidence of femoral dislocation. Of the proximal femur are at the junction of the femoral neck and femoral head.  IMPRESSION: Fracture the proximal left femur best classified as subcapital.   Electronically Signed   By: Skipper Cliche M.D.   On: 07/02/2014 18:30   Dg Hip Unilat With Pelvis 2-3 Views Left  07/03/2014   CLINICAL DATA:  Left hip fracture. Status post internal fixation. Initial encounter.  EXAM: LEFT HIP (WITH PELVIS) 2-3 VIEWS  COMPARISON:  07/03/2014  FINDINGS: Three internal fixation screws are seen transfixing a left femoral neck fracture in near anatomic alignment. No evidence other fracture or dislocation.  IMPRESSION: Internal fixation of left femoral neck fracture in near anatomic alignment.  Electronically Signed   By: Earle Gell M.D.   On: 07/03/2014 12:39   Dg Hip Unilat With Pelvis 2-3 Views Left  07/03/2014   CLINICAL DATA:  Left hip fracture  EXAM: FLOURO RM GT 60 MIN; LEFT HIP (WITH PELVIS) 2-3 VIEWS  FLUOROSCOPY TIME:  Fluoroscopy Time (in minutes and seconds): 1 minutes 58 seconds  Number of Acquired Images:  2  COMPARISON:  CT left hip dated 07/02/2014  FINDINGS: Intraoperative  fluoroscopic images during ORIF of a subcapital left proximal femur fracture.  Three cannulated cancellous screws transfix the fracture, which is in near anatomic alignment and position.  IMPRESSION: Intraoperative fluoroscopic images during ORIF of a subcapital left proximal femur fracture, as above.   Electronically Signed   By: Julian Hy M.D.   On: 07/03/2014 10:28   Ct Head Limited W/o Cm  07/02/2014   CLINICAL DATA:  Tripped at home hit the back of head in a fall no loss of consciousness on anti coagulation  EXAM: CT HEAD WITHOUT CONTRAST  TECHNIQUE: Contiguous axial images were obtained from the base of the skull through the vertex without intravenous contrast.  COMPARISON:  01/02/2014  FINDINGS: Posterior right scalp hematoma. No skull fracture. No hemorrhage or extra-axial fluid. Stable diffuse atrophy and low attenuation in the white matter diffusely. No evidence of mass. No hydrocephalus.  IMPRESSION: Scalp hematoma with no acute intracranial abnormalities.   Electronically Signed   By: Skipper Cliche M.D.   On: 07/02/2014 16:45    Medications:  Scheduled: . diltiazem  30 mg Oral 4 times per day  . gabapentin  200 mg Oral TID  . levothyroxine  50 mcg Oral QAC breakfast  . pantoprazole (PROTONIX) IV  40 mg Intravenous Q12H  . rivaroxaban  20 mg Oral Daily  . sertraline  100 mg Oral Daily   Continuous: . sodium chloride 75 mL/hr at 07/04/14 0242   ION:[id number], ALPRAZolam, ipratropium-albuterol, morphine injection, ondansetron (ZOFRAN) IV, sodium chloride, traMADol  Assessment/Plan: 1. Left Hip Fracture: Now s/p surgical repair. Doing well. PT today.  2. Pulmonary Embolism: Bridging Xarelto with lovenox for surgery. D/C lovenox today and restarting Xeralto.  3. Attention Tremor: Has history of this periodically under stress. No further workup at this time.  4. Scalp Laceration: Head CT negative for bleed.   5. Anxiety: On chronic benzoes. Continue home dose .   Time spent 76min   LOS: 2 days   Baxter Hire 07/04/2014, 10:06 AM

## 2014-07-04 NOTE — Clinical Social Work Note (Signed)
Clinical Social Work Assessment  Patient Details  Name: Lauren Mccann MRN: [id number] Date of Birth: 1930/05/05  Date of referral:  07/04/14               Reason for consult:  Facility Placement                Permission sought to share information with:  Chartered certified accountant granted to share information::  Yes, Verbal Permission Granted  Name::      Lauren Mccann::    [place]  Relationship::     Contact Information:     Housing/Transportation Living arrangements for the past 2 months:  Apartment Source of Information:  Patient Patient Interpreter Needed:  None Criminal Activity/Legal Involvement Pertinent to Current Situation/Hospitalization:  No - Comment as needed Significant Relationships:  Neighbor, Other Family Members Lives with:  Self Do you feel safe going back to the place where you live?  Yes Need for family participation in patient care:  No (Coment)  Care giving concerns: none   Facilities manager / plan: Holiday representative (CSW) met with patient to discuss D/C plan. PT is recommending short term rehab. CSW introduced self and explained SNF process. Patient reported that she lives in an apartment at [address] in [place]. Patient reported that all her children have passed away and her primary support person is her daughter in law Lauren Mccann [phone] cell. Patient is agreeable to SNF search in [facility] and prefers [facility]. Patient has been to [facility] in the past but does not want to go back there. In [facility] patient is showing as Medicaid only. Patient reported that she has Medicare [employer]: [id number]. CSW contacted [facility] and confirmed her enrollment with Medicare [place]. FL2 complete and faxed out. CSW will continue to follow and assist as needed.    Employment status:  Retired Nurse, adult PT Recommendations:  [place] /  Referral to community resources:  Early  Patient/Family's Response to care: Patient is agreeable to [facility].   Emotional Assessment Appearance:  Appears stated age, Well-Groomed Attitude/Demeanor/Rapport:  Other (Calm and pleasant) Affect (typically observed):  Calm, Pleasant Orientation:  Oriented to Self, Oriented to Place, Oriented to  Time, Oriented to Situation Alcohol / Substance use:  Never Used Psych involvement (Current and /or in the community):  No (Comment)  Discharge Needs  Concerns to be addressed:  Other (Comment Required (Going to SNF) Readmission within the last 30 days:  No Current discharge risk:  None Barriers to Discharge:  No Barriers Identified   Elwyn Reach, [phone] 07/04/2014, 4:50 PM

## 2014-07-04 NOTE — Consult Note (Addendum)
PATIENT NAME:  Lauren Mccann, Lauren Mccann MR#:  [id number] DATE OF BIRTH:  01/02/1931  DATE OF CONSULTATION:  07/02/2014  CONSULTING PHYSICIAN:  Claud Kelp, MD  CHIEF COMPLAINT:  Left hip pain.   HISTORY OF PRESENT ILLNESS:  [name] is an 81 year old female with multiple medical problems who tripped and fell with immediate pain and inability to bear weight on her left hip. She denied any loss of consciousness prior to the fall. She presented to the Emergency Room for further evaluation.   PAST MEDICAL AND SURGICAL HISTORY:  Significant for a pulmonary embolus with IVC filter placement, anxiety, fibromyalgia mastectomy, cholecystectomy, hysterectomy, and previous back surgery.   HOME MEDICATIONS:  Include Zoloft 100 mg daily, Zofran as needed daily, Xarelto once daily, tramadol 50 mg tablets as needed, Nexium 40 mg daily, gabapentin 200 mg t.i.d., diltiazem 30 mg b.i.d., Xanax 1 mg t.i.d.   ALLERGIES:  INCLUDE BENADRYL, CODEINE, CYMBALTA, DARVOCET, LEVAQUIN, AND PREDNISONE.   REVIEW OF SYSTEMS:  She currently denies any loss of consciousness. She does have headache. She is having no difficulty swallowing. She is denying chest pain or shortness of breath. She is denying nausea or vomiting.   PHYSICAL EXAMINATION:  GENERAL:  The patient is lying on the hospital gurney in no acute distress. She is alert and oriented.  EXTREMITIES:  She has intact sensation in all distributions in her left foot with palpable pulses. She does not appear to be shortened or actually rotated about the left lower extremity. She has no tenderness to palpation about either upper extremity or her right lower extremity.  LUNGS:  Her breathing is not labored.  ABDOMEN:  Soft.   LABORATORY DATA:  White count 12.7, H and H 9.4 and 30.   RADIOGRAPHS:  X-rays taken demonstrate what appear to be an impacted left femoral neck fracture. CT scan confirmed an impacted femoral neck fracture.   ASSESSMENT:  An 81 year old female  with an impacted femoral neck fracture.   PLAN:  The patient will be admitted to the medicine service for medical optimization and risk stratification with a plan for cannulated screw fixation of the left hip in the morning.    ____________________________ Claud Kelp, MD tte:TT D: 07/02/2014 18:20:16 ET T: 07/03/2014 03:42:59 ET JOB#: [id number]  cc: Claud Kelp, MD, <Dictator> Claud Kelp MD ELECTRONICALLY SIGNED 07/11/2014 19:21

## 2014-07-04 NOTE — Progress Notes (Signed)
   Subjective: 1 Day Post-Op Procedure(s) (LRB): CANNULATED HIP PINNING (Left) Patient reports pain as 6 on 0-10 scale.   Patient is well, and has had no acute complaints or problems We will start therapy today.  Plan is to go Skilled nursing facility after hospital stay. no nausea and no vomiting no cp and sob Objective: Vital signs in last 24 hours: Temp:  [97.5 F (36.4 C)-100.5 F (38.1 C)] 98.1 F (36.7 C) (05/02 1104) Pulse Rate:  [81-108] 100 (05/02 1107) Resp:  [16-22] 18 (05/02 1104) BP: (98-145)/(57-78) 108/60 mmHg (05/02 1104) SpO2:  [89 %-97 %] 95 % (05/02 1107) Dressing still on.  Intake/Output from previous day: 05/01 0701 - 05/02 0700 In: 2125 [I.V.:2125] Out: 2195 [Urine:2175; Blood:20] Intake/Output this shift: Total I/O In: 300 [I.V.:300] Out: 650 [Urine:650]   Recent Labs  07/02/14 1755 07/03/14 0358  HGB 9.4* 9.2*    Recent Labs  07/02/14 1755 07/03/14 0358  WBC 12.7* 9.7  RBC 3.79* 3.65*  HCT 30.0* 28.9*  PLT 277 255    Recent Labs  07/02/14 1755 07/03/14 0358  NA 138 138  K 4.2 4.5  CL 103 104  CO2 26 27  BUN 16 11  CREATININE 0.98 0.89  GLUCOSE 123* 131*  CALCIUM 9.0 8.6*    Recent Labs  07/02/14 1755  INR 1.2    EXAM General - Patient is Alert, Appropriate and Oriented Extremity - Neurologically intact Neurovascular intact Sensation intact distally Intact pulses distally Dorsiflexion/Plantar flexion intact Dressing - dressing C/D/I Motor Function - intact, moving foot and toes well on exam. Good muscle tone  No past medical history on file.  Assessment/Plan: 1 Day Post-Op Procedure(s) (LRB): CANNULATED HIP PINNING (Left) Active Problems:   Anxiety   Hip fracture requiring operative repair  Estimated body mass index is 30.56 kg/(m^2) as calculated from the following:   Height as of this encounter: 5' 1.97" (1.574 m).   Weight as of this encounter: 75.7 kg (166 lb 14.2 oz). Advance diet Discharge to  SNF  DVT Prophylaxis - Lovenox, Foot Pumps and TED hose Weight-Bearing as tolerated to left leg D/C O2 and Pulse OX and try on Room Air  Jon R. [place] 07/04/2014, 11:24 AM

## 2014-07-04 NOTE — Progress Notes (Signed)
Spoke to [place], MD to clarify order of lovenox. Orders received. Give dose of lovenox this pm

## 2014-07-04 NOTE — Care Management Note (Signed)
Case Management Note  Patient Details  Name: Lauren Mccann MRN: [id number] Date of Birth: 09-18-1930  Subjective/Objective:                    Action/Plan:   Expected Discharge Date:                  Expected Discharge Plan:     In-House Referral:     Discharge planning Services     Post Acute Care Choice:    Choice offered to:     DME Arranged:    DME Agency:     HH Arranged:    [place] Agency:     Status of Service:     Medicare Important Message Given:  Yes Date Medicare IM Given:  07/04/14 Medicare IM give by:  Marshell Garfinkel Date Additional Medicare IM Given:    Additional Medicare Important Message give by:     If discussed at [place] of Stay Meetings, dates discussed:    Additional Comments:  Patient POD 1. Met with patient who states she is from home alone. She has been to [facility] twice in the past and had home health but doesn't remember name of agency. She wants to return home at discharge but agrees to SNF if needed. She has a front-wheeled walker available at home. She uses [facility] for Rx. RNCM will continue to follow. She states her Primary health insurance is Medicare [facility] and also has Medicaid.   Marshell Garfinkel, RN 07/04/2014, 11:29 AM

## 2014-07-04 NOTE — Progress Notes (Signed)
Physical Therapy Treatment Patient Details Name: Lauren Mccann MRN: [id number] DOB: [birth year] Today's Date: 07/04/2014    History of Present Illness The patient is an 81 year old female with a history of pulmonary embolism, on Xarelto who tripped and fell, injuring her left hip. She was brought to the Emergency Room where she was found to have a hip fracture.  Pt is s/p hip pinning by Dr. Daine Gip on 07/03/14.    PT Comments    Pt continued up in chair from earlier session. Pt complains of 9/10 pain in L hip and is noted to be shaky. Pt notes she is nervous and in pain. Pt performed stand pivot chair to bed with Min A. Supine bed exercises as noted within tolerance regarding pain. No increases in pain post session. Pt comfortable in bed.   Follow Up Recommendations  SNF     Equipment Recommendations  Rolling walker with 5" wheels    Recommendations for Other Services      Precautions / Restrictions Restrictions Weight Bearing Restrictions: Yes LLE Weight Bearing: Touchdown weight bearing    Mobility  Bed Mobility Overal bed mobility: Needs Assistance Bed Mobility: Sit to Supine     Supine to sit: Mod assist;HOB elevated Sit to supine: Mod assist (Primarily for LEs)   General bed mobility comments: Able to assist with BLEs and UEs for repositioning upward in bed  Transfers Overall transfer level: Needs assistance Equipment used: Rolling walker (2 wheeled) Transfers: Sit to/from [facility] Sit to Stand: Min assist Stand pivot transfers: Min assist       General transfer comment: Cues for TDW on L  Ambulation/Gait Ambulation/Gait assistance: Mod assist Ambulation Distance (Feet): 3 Feet Assistive device: Rolling walker (2 wheeled) Gait Pattern/deviations: Step-to pattern     General Gait Details: Pt unable to maintain TTWB 100% of the time during ambulation; heavy lean on RW with decreased UE strength.   Stairs            Wheelchair  Mobility    Modified Rankin (Stroke Patients Only)       Balance Overall balance assessment: Needs assistance Sitting-balance support: Bilateral upper extremity supported       Standing balance support: Bilateral upper extremity supported                        Cognition Arousal/Alertness: Awake/alert Behavior During Therapy: Anxious Overall Cognitive Status: Within Functional Limits for tasks assessed                      Exercises Other Exercises Other Exercises: BLE active assisted range of motion for ankle pumps, SAQ, heelslides, hip abd/adduction and SLR 12x each    General Comments        Pertinent Vitals/Pain Pain Assessment: 0-10 Pain Score: 9  Pain Intervention(s): Limited activity within patient's tolerance;Repositioned;Ice applied    Home Living Family/patient expects to be discharged to:: Private residence Living Arrangements: Alone   Type of Home: House Home Access: Elevator   Home Layout: Multi-level Home Equipment: Environmental consultant - 4 wheels      Prior Function Level of Independence: Independent with assistive device(s)      Comments: Limited household ambulator, poor standing tolerance due to back pain.   PT Goals (current goals can now be found in the care plan section) Acute Rehab PT Goals Patient Stated Goal: To decrease pain. PT Goal Formulation: With patient Time For Goal Achievement: 07/18/14 Potential to Achieve Goals: Good  Progress towards PT goals: Progressing toward goals    Frequency  BID    PT Plan Current plan remains appropriate    Co-evaluation             End of Session Equipment Utilized During Treatment: Gait belt Activity Tolerance: Patient limited by fatigue;Patient limited by pain Patient left: in bed;with call bell/phone within reach;with bed alarm set     Time: 1331-1400 PT Time Calculation (min) (ACUTE ONLY): 29 min  Charges:  $Gait Training: 8-22 mins $Therapeutic Exercise: 8-22  mins $Therapeutic Activity: 8-22 mins                    G Codes:      [name] 07/04/2014, 2:24 PM

## 2014-07-04 NOTE — Clinical Social Work Placement (Addendum)
   CLINICAL SOCIAL WORK PLACEMENT  NOTE  Date:  07/04/2014  Patient Details  Name: Lauren Mccann MRN: [id number] Date of Birth: 1930/06/22  Clinical Social Work is seeking post-discharge placement for this patient at the [place] level of care (*CSW will initial, date and re-position this form in  chart as items are completed):  Yes   Patient/family provided with [place] Work Department's list of facilities offering this level of care within the geographic area requested by the patient (or if unable, by the patient's family).  Yes   Patient/family informed of their freedom to choose among providers that offer the needed level of care, that participate in Medicare, Medicaid or managed care program needed by the patient, have an available bed and are willing to accept the patient.  Yes   Patient/family informed of [place]'s ownership interest in [facility] and [facility], as well as of the fact that they are under no obligation to receive care at these facilities.  PASRR submitted to EDS on       PASRR number received on       Existing PASRR number confirmed on 07/04/14     FL2 transmitted to all facilities in geographic area requested by pt/family on 07/04/14     FL2 transmitted to all facilities within larger geographic area on       Patient informed that his/her managed care company has contracts with or will negotiate with certain facilities, including the following:            Patient/family informed of bed offers received.  Patient chooses bed at   [facility]   Physician recommends and patient chooses bed at      Patient to be transferred to   [facility] on  07/06/14.   Patient to be transferred to facility by   EMS     Patient family notified on  07/06/14 of transfer.  Name of family member notified:   Darla patient's daughter in law was called at [phone] and made aware of D/C.   PHYSICIAN        Additional Comment:    _______________________________________________ Loralyn Freshwater, LCSW 07/04/2014, 4:43 PM

## 2014-07-04 NOTE — H&P (Signed)
PATIENT NAME:  Lauren Mccann, Lauren Mccann MR#:  [id number] DATE OF BIRTH:  [birth year]  DATE OF ADMISSION:  07/02/2014  REFERRING PHYSICIAN: Elta Guadeloupe R. Jacqualine Code, MD   FAMILY PHYSICIAN: Youlanda Roys. Lovie Macadamia, MD   REASON FOR ADMISSION: Left hip fracture.   HISTORY OF PRESENT ILLNESS: The patient is an 81 year old female with a history of pulmonary embolism, on Xarelto. Also has a history of chronic anxiety and fibromyalgia. Also a remote history of breast cancer, status post mastectomy. She was at home earlier today when she tripped and fell, injuring her left hip. She was brought to the Emergency Room where she was found to have a hip fracture and is now admitted for further evaluation. Complaining of hip pain, but is otherwise unremarkable. Denies syncope or palpitations. No chest pain or shortness of breath.   PAST MEDICAL HISTORY: 1.  Pulmonary embolism, on Xarelto.  2.  Chronic anxiety.  3.  Fibromyalgia.  4.  Breast cancer status post left mastectomy.  5.  Environmental allergies.  6.  Status post cholecystectomy.  7.  Status post hysterectomy.   MEDICATIONS: 1.  Zoloft 100 mg p.o. daily.  2.  Zofran 4 mg p.o. q. 4-6 hours p.r.n. nausea and vomiting.  3.  Xarelto 20 mg p.o. daily.  4.  Tramadol 50 mg p.o. q. 4 hours p.r.n. pain.  5.  Nexium 40 mg p.o. daily.  6.  Gabapentin 200 mg p.o. t.i.d.  7.  Diltiazem 30 mg p.o. b.i.d.  8.  Xanax 1 mg p.o. t.i.d.    ALLERGIES: PREDNISONE, CODEINE, LEVAQUIN, BENADRYL, AND CYMBALTA.   SOCIAL HISTORY: Negative for alcohol or tobacco use.   FAMILY HISTORY: Positive for hypertension and coronary artery disease.   REVIEW OF SYSTEMS: CONSTITUTIONAL: No fever or change in weight.  EYES: No blurred or double vision. No glaucoma.  ENT: No tinnitus or hearing loss. No nasal discharge or bleeding. No difficulty swallowing.  RESPIRATORY: No cough or wheezing. Denies hemoptysis. No painful respiration.  CARDIOVASCULAR: No chest pain or orthopnea. No palpitations  or syncope.  GASTROINTESTINAL: No nausea, vomiting, or diarrhea. No abdominal pain. No change in bowel habits.  GENITOURINARY: No dysuria or hematuria. No incontinence.  ENDOCRINE: No polyuria or polydipsia. No heat or cold intolerance.  HEMATOLOGICAL: The patient denies anemia, easy bruising, or bleeding.  LYMPHATIC: No swollen glands.  MUSCULOSKELETAL: The patient has pain in her neck, back, shoulders, or knees. Does have left hip pain. No gout.  NEUROLOGIC: No numbness or migraines. Denies stroke or seizures.  PSYCHIATRIC: The patient denies insomnia, but does admit to anxiety.   PHYSICAL EXAMINATION:  GENERAL: The patient is in no acute distress.  VITAL SIGNS: Remarkable for blood pressure of 149/72, heart rate 71, respiratory rate of 20, temperature 97.8, saturation 92% on room air.  HEENT: Normocephalic, atraumatic. Pupils equally round and reactive to light and accommodation. Extraocular movements are intact. Sclerae are not icteric. Conjunctivae are clear. Oropharynx is clear.  NECK: Supple without JVD. No adenopathy or thyromegaly is noted.  LUNGS: Clear to auscultation and percussion without wheezes, rales or rhonchi. No dullness. Respiratory effort is normal.  CARDIAC: Regular rate and rhythm with normal S1, S2. No significant rubs, murmurs or gallops. PMI is nondisplaced. Chest wall is nontender.  ABDOMEN: Soft, nontender, with normoactive bowel sounds. No organomegaly or masses were appreciated. No hernias or bruits were noted.  EXTREMITIES: Without clubbing, cyanosis or edema. Pulses were 2+ bilaterally.  SKIN: Warm and dry without rash or lesions.  NEUROLOGIC: Cranial nerves  II through XII grossly intact. Deep tendon reflexes were symmetric. Motor and sensory examination is nonfocal.  PSYCHIATRIC: Revealed a patient who is alert and oriented to person, place, and time. She was cooperative and used good judgment.   LABORATORY DATA: Chest x-ray was unremarkable. Head CT revealed  mild hematoma. Left hip films revealed a hip fracture. Her INR was 1.2. Glucose 123 with a BUN of 16, creatinine 0.98 with a sodium of 138, and a potassium of 4.2 with a GFR of 54.   ASSESSMENT: 1.  Acute left hip fracture.  2.  Pulmonary embolism, on Xarelto.   3.  Fibromyalgia.  4.  Anxiety.  5.  Hyperglycemia.   PLAN: The patient will be admitted to orthopedics. We will consult orthopedic surgery for surgical repair. We will hold her Xarelto and use Lovenox for the time being. We will continue her other outpatient medications. Follow up routine labs in the morning. Further treatment and evaluation will depend upon the patient's progress.   TOTAL TIME SPENT ON THIS PATIENT: 50 minutes.    ____________________________ Leonie Douglas Doy Hutching, MD jds:at D: 07/02/2014 18:14:18 ET T: 07/02/2014 18:41:44 ET JOB#: [id number]  cc: Leonie Douglas. Doy Hutching, MD, <Dictator> Youlanda Roys. Lovie Macadamia, MD Momo Braun Lennice Sites MD ELECTRONICALLY SIGNED 07/02/2014 20:55

## 2014-07-04 NOTE — Evaluation (Signed)
Physical Therapy Evaluation Patient Details Name: Lauren Mccann MRN: [id number] DOB: [birth year] Today's Date: 07/04/2014   History of Present Illness  The patient is an 81 year old female with a history of pulmonary embolism, on Xarelto who tripped and fell, injuring her left hip. She was brought to the Emergency Room where she was found to have a hip fracture.  Pt is s/p hip pinning by Dr. Daine Gip on 07/03/14.  Clinical Impression  Pt presents with decreased bed mobility, transfers, and gait related to L hip pinning on 07/03/14.  Pt lives alone in a 3 story apartment with elevator and reports she has limited mobility at baseline due to chronic back pain.  Pt currently requires Mod A for bed mobility, and Mod A +2 to ambulate from bed to chair with RW.  Pt is unable to maintain TTWB throughout session.  Pt would benefit from PT services to address PT findings.  Recommend SNF at discharge. Today's intervention:  Bed mobility, therex, pt education, gait training, transfer training.    Follow Up Recommendations SNF    Equipment Recommendations       Recommendations for Other Services OT consult     Precautions / Restrictions Restrictions Weight Bearing Restrictions: Yes LLE Weight Bearing: Touchdown weight bearing      Mobility  Bed Mobility Overal bed mobility: Needs Assistance Bed Mobility: Supine to Sit     Supine to sit: Mod assist;HOB elevated        Transfers Overall transfer level: Needs assistance Equipment used: Rolling walker (2 wheeled) Transfers: Sit to/from Stand Sit to Stand: Min assist         General transfer comment: verbal cues for hand placement and safety  Ambulation/Gait Ambulation/Gait assistance: Mod assist Ambulation Distance (Feet): 3 Feet Assistive device: Rolling walker (2 wheeled) Gait Pattern/deviations: Step-to pattern     General Gait Details: Pt unable to maintain TTWB 100% of the time during ambulation; heavy lean on RW with decreased  UE strength.  Stairs            Wheelchair Mobility    Modified Rankin (Stroke Patients Only)       Balance Overall balance assessment: Needs assistance Sitting-balance support: Bilateral upper extremity supported       Standing balance support: Bilateral upper extremity supported                                 Pertinent Vitals/Pain Pain Assessment: Faces (With movement, pain medications prior to treatment.) Pain Intervention(s): Limited activity within patient's tolerance;Repositioned    Home Living Family/patient expects to be discharged to:: Private residence Living Arrangements: Alone   Type of Home: House Home Access: Elevator     Home Layout: Multi-level Home Equipment: Environmental consultant - 4 wheels      Prior Function Level of Independence: Independent with assistive device(s)         Comments: Limited household ambulator, poor standing tolerance due to back pain.     Hand Dominance        Extremity/Trunk Assessment   Upper Extremity Assessment: Generalized weakness           Lower Extremity Assessment: LLE deficits/detail   LLE Deficits / Details: limited by surgical pain; able to perform ankle pumps and heel slide independently; grossly 2/5 with decreased muscle power     Communication   Communication: No difficulties  Cognition Arousal/Alertness: Awake/alert Behavior During Therapy: WFL for tasks assessed/performed Overall Cognitive  Status: Within Functional Limits for tasks assessed                      General Comments      Exercises Other Exercises Other Exercises: ankle pumps and heel slides bilaterally for 10 reps each with verbal and tactile cues throughout.      Assessment/Plan    PT Assessment Patient needs continued PT services  PT Diagnosis Difficulty walking;Abnormality of gait;Generalized weakness;Acute pain   PT Problem List Decreased strength;Decreased range of motion;Decreased activity  tolerance;Decreased balance;Decreased mobility;Decreased knowledge of use of DME;Decreased safety awareness;Decreased knowledge of precautions;Pain  PT Treatment Interventions DME instruction;Gait training;Functional mobility training;Therapeutic activities;Therapeutic exercise;Balance training   PT Goals (Current goals can be found in the Care Plan section) Acute Rehab PT Goals Patient Stated Goal: To decrease pain. PT Goal Formulation: With patient Time For Goal Achievement: 07/18/14 Potential to Achieve Goals: Good    Frequency BID   Barriers to discharge Decreased caregiver support Pt lives alone.    Co-evaluation               End of Session Equipment Utilized During Treatment: Gait belt Activity Tolerance: Patient limited by pain Patient left: in chair;with call bell/phone within reach;with chair alarm set           Time: 3568-6168 PT Time Calculation (min) (ACUTE ONLY): 30 min   Charges:   PT Evaluation $Initial PT Evaluation Tier I: 1 Procedure PT Treatments $Gait Training: 8-22 mins   PT G Codes:        Bird Tailor A Eleanore Junio Jul 23, 2014, 12:40 PM

## 2014-07-04 NOTE — Progress Notes (Signed)
Pt slept well. Required anxiety medication x 2, pain medication x 1. Pt is a high fall risk safety precautions in place and offered toileting on hourly rounds. O2 at 2L [place]. No needs expressed at this time

## 2014-07-04 NOTE — Progress Notes (Signed)
Clinical Education officer, museum (CSW) presented bed offers to patient. Patient chose [facility]. Plan is for patient to D/C to  [facility] on Wednesday. CSW will continue to follow and assist as needed.   Blima Rich, [place] [phone]

## 2014-07-05 ENCOUNTER — Encounter
Admission: RE | Admit: 2014-07-05 | Discharge: 2014-07-05 | Disposition: A | Discharge status: Home / Self Care | Payer: Medicare Other | Source: Ambulatory Visit | Attending: Internal Medicine | Admitting: Internal Medicine

## 2014-07-05 DIAGNOSIS — I10 Essential (primary) hypertension: Secondary | ICD-10-CM | POA: Insufficient documentation

## 2014-07-05 DIAGNOSIS — D649 Anemia, unspecified: Secondary | ICD-10-CM | POA: Insufficient documentation

## 2014-07-05 DIAGNOSIS — F329 Major depressive disorder, single episode, unspecified: Secondary | ICD-10-CM | POA: Insufficient documentation

## 2014-07-05 LAB — HEMOGLOBIN: HEMOGLOBIN: 8.1 g/dL — AB (ref 12.0–16.0)

## 2014-07-05 LAB — GLUCOSE, CAPILLARY: Glucose-Capillary: 131 mg/dL — ABNORMAL HIGH (ref 70–99)

## 2014-07-05 MED ORDER — MAGNESIUM HYDROXIDE 400 MG/5ML PO SUSP: 30.0000 mL | Freq: Two times a day (BID) | ORAL | Status: DC | PRN

## 2014-07-05 MED ORDER — FERROUS SULFATE 325 (65 FE) MG PO TABS
325.0000 mg | ORAL_TABLET | Freq: Two times a day (BID) | ORAL | Status: DC
  Administered 2014-07-05 – 2014-07-06 (×2): 325 mg via ORAL
  Filled 2014-07-05 (×2): qty 1

## 2014-07-05 MED ORDER — PANTOPRAZOLE SODIUM 40 MG PO TBEC
40.0000 mg | DELAYED_RELEASE_TABLET | Freq: Two times a day (BID) | ORAL | Status: DC
Start: 2014-07-05 — End: 2014-07-06
  Administered 2014-07-05 – 2014-07-06 (×3): 40 mg via ORAL
  Filled 2014-07-05 (×3): qty 1

## 2014-07-05 MED ORDER — BISACODYL 10 MG RE SUPP
10.0000 mg | Freq: Every day | RECTAL | Status: AC | PRN
  Administered 2014-07-06: 10 mg via RECTAL
  Filled 2014-07-05: qty 1

## 2014-07-05 NOTE — Progress Notes (Signed)
Patient ID: Lauren [facility], female   DOB: [birth year], 81 y.o.   MRN: [id number] [facility] PROGRESS NOTE  HPI/Subjective: Patient states that she is not ready to go to rehabilitation. She states that she cannot move her left leg very well. She does not feel very well. She states that she is always anemic.  Objective: Filed Vitals:   07/05/14 1259  BP: 130/58  Pulse:   Temp:   Resp:     Intake/Output Summary (Last 24 hours) at 07/05/14 1355 Last data filed at 07/05/14 1000  Gross per 24 hour  Intake   2802 ml  Output   2000 ml  Net    802 ml   Filed Weights   07/02/14 2003  Weight: 75.7 kg (166 lb 14.2 oz)    ROS: Review of Systems  Constitutional: Negative for fever and chills.  Eyes: Negative for blurred vision.  Respiratory: Negative for cough and shortness of breath.   Cardiovascular: Negative for chest pain.  Gastrointestinal: Negative for nausea, vomiting, diarrhea and constipation.  Genitourinary: Negative for dysuria.  Musculoskeletal: Positive for joint pain and falls.  Neurological: Negative for dizziness and headaches.   Exam: Physical Exam  HENT:  Nose: No mucosal edema.  Mouth/Throat: No oropharyngeal exudate or posterior oropharyngeal edema.  Eyes: Conjunctivae, EOM and lids are normal. Pupils are equal, round, and reactive to light.  Neck: No JVD present. Carotid bruit is not present. No edema present. No thyroid mass and no thyromegaly present.  Cardiovascular: S1 normal and S2 normal.  Exam reveals no gallop.   No murmur heard. Pulses:      Dorsalis pedis pulses are 2+ on the right side, and 2+ on the left side.  Respiratory: No respiratory distress. She has no wheezes. She has no rhonchi. She has no rales.  GI: Soft. Bowel sounds are normal. There is no tenderness.  Musculoskeletal:       Left hip: She exhibits decreased strength.  Lymphadenopathy:    She has no cervical adenopathy.  Neurological: She is alert. No cranial nerve  deficit.  Skin: Skin is warm. No rash noted. Nails show no clubbing.  Psychiatric: She has a normal mood and affect.   extremity exam continued: Patient is able to flex at the left ankle, good sensation left leg unable to straight leg raise on the left.    Data Reviewed: Basic Metabolic Panel:  Recent Labs Lab 06/30/14 1352 07/02/14 1755 07/03/14 0358  NA 136 138 138  K 3.8 4.2 4.5  CL 103 103 104  CO2 25 26 27   GLUCOSE 91 123* 131*  BUN 17 16 11   CREATININE 1.02* 0.98 0.89  CALCIUM 9.1 9.0 8.6*   CBC:  Recent Labs Lab 06/30/14 1352 07/02/14 1755 07/03/14 0358 07/05/14 0957  WBC 8.2 12.7* 9.7  --   NEUTROABS 4.1  --   --   --   HGB 9.8* 9.4* 9.2* 8.1*  HCT 31.1* 30.0* 28.9*  --   MCV 79* 79* 79.3*  --   PLT 328 277 255  --     Scheduled Meds: . diltiazem  30 mg Oral 4 times per day  . gabapentin  200 mg Oral TID  . levothyroxine  50 mcg Oral QAC breakfast  . pantoprazole  40 mg Oral BID  . rivaroxaban  20 mg Oral Daily  . sertraline  100 mg Oral Daily   Continuous Infusions:   Assessment/Plan: Active Problems:   Anxiety   Hip fracture requiring  operative repair   1. Postoperative anemia. I will start ferrous sulfate. IV fluids were stopped. Patient also states that when she fell she was in a pool of blood from the laceration on her head. 2. Left hip fracture requiring operative repair closed, subsequent encounter. Patient will need rehabilitation likely discharge tomorrow. 3. History of pulmonary embolism back on Xaralto. 4. Tremor-improved today 5. Anxiety-on Zoloft 6. Gastroesophageal reflux disease without esophagitis on Protonix 7. Scalp laceration.  Code Status:     Code Status Orders        Start     Ordered   07/03/14 0001  Full code   Continuous     07/02/14 2022     Disposition Plan: Likely to rehabilitation tomorrow.  Time spent: 25 minutes  Loletha Grayer  [facility]

## 2014-07-05 NOTE — Discharge Planning (Addendum)
INSTRUCTIONS AFTER Surgery  o Remove items at home which could result in a fall. This includes throw rugs or furniture in walking pathways o ICE to the affected joint every three hours while awake for 30 minutes at a time, for at least the first 3-5 days, and then as needed for pain and swelling.  Continue to use ice for pain and swelling. You may notice swelling that will progress down to the foot and ankle.  This is normal after surgery.  Elevate your leg when you are not up walking on it.   o Continue to use the breathing machine you got in the hospital (incentive spirometer) which will help keep your temperature down.  It is common for your temperature to cycle up and down following surgery, especially at night when you are not up moving around and exerting yourself.  The breathing machine keeps your lungs expanded and your temperature down.   DIET:  As you were doing prior to hospitalization, we recommend a well-balanced diet.  DRESSING / WOUND CARE / SHOWERING Dressing change as needed. You may change your dressing 3-5 days after surgery.  Then change the dressing every day with sterile gauze.  Please use good hand washing techniques before changing the dressing.  Do not use any lotions or creams on the incision until instructed by your surgeon.  ACTIVITY  o Increase activity slowly as tolerated, but follow the weight bearing instructions below.   o No driving for 6 weeks or until further direction given by your physician.  You cannot drive while taking narcotics.  o No lifting or carrying greater than 10 lbs. until further directed by your surgeon. o Avoid periods of inactivity such as sitting longer than an hour when not asleep. This helps prevent blood clots.  o You may return to work once you are authorized by your doctor.     WEIGHT BEARING   Other:  Toe touch weight bearing   EXERCISES  Results after joint surgery are often greatly improved when you follow the exercise,  range of motion and muscle strengthening exercises prescribed by your doctor. Safety measures are also important to protect the joint from further injury. Any time any of these exercises cause you to have increased pain or swelling, decrease what you are doing until you are comfortable again and then slowly increase them. If you have problems or questions, call your caregiver or physical therapist for advice.   Rehabilitation is important following a joint surgery. After just a few days of immobilization, the muscles of the leg can become weakened and shrink (atrophy).  These exercises are designed to build up the tone and strength of the thigh and leg muscles and to improve motion. Often times heat used for twenty to thirty minutes before working out will loosen up your tissues and help with improving the range of motion but do not use heat for the first two weeks following surgery (sometimes heat can increase post-operative swelling).   These exercises can be done on a training (exercise) mat, on the floor, on a table or on a bed. Use whatever works the best and is most comfortable for you.    Use music or television while you are exercising so that the exercises are a pleasant break in your day. This will make your life better with the exercises acting as a break in your routine that you can look forward to.   Perform all exercises about fifteen times, three times per day or  as directed.  You should exercise both the operative leg and the other leg as well.  Exercises include:   . Quad Sets - Tighten up the muscle on the front of the thigh (Quad) and hold for 5-10 seconds.   . Straight Leg Raises - With your knee straight (if you were given a brace, keep it on), lift the leg to 60 degrees, hold for 3 seconds, and slowly lower the leg.  Perform this exercise against resistance later as your leg gets stronger.  . Leg Slides: Lying on your back, slowly slide your foot toward your buttocks, bending your  knee up off the floor (only go as far as is comfortable). Then slowly slide your foot back down until your leg is flat on the floor again.  Glenard Haring Wings: Lying on your back spread your legs to the side as far apart as you can without causing discomfort.  . Hamstring Strength:  Lying on your back, push your heel against the floor with your leg straight by tightening up the muscles of your buttocks.  Repeat, but this time bend your knee to a comfortable angle, and push your heel against the floor.  You may put a pillow under the heel to make it more comfortable if necessary.   A rehabilitation program following joint surgery can speed recovery and prevent re-injury in the future due to weakened muscles. Contact your doctor or a physical therapist for more information on knee rehabilitation.    CONSTIPATION  Constipation is defined medically as fewer than three stools per week and severe constipation as less than one stool per week.  Even if you have a regular bowel pattern at home, your normal regimen is likely to be disrupted due to multiple reasons following surgery.  Combination of anesthesia, postoperative narcotics, change in appetite and fluid intake all can affect your bowels.   YOU MUST use at least one of the following options; they are listed in order of increasing strength to get the job done.  They are all available over the counter, and you may need to use some, POSSIBLY even all of these options:    Drink plenty of fluids (prune juice may be helpful) and high fiber foods Colace 100 mg by mouth twice a day  Senokot for constipation as directed and as needed Dulcolax (bisacodyl), take with full glass of water  Miralax (polyethylene glycol) once or twice a day as needed.  If you have tried all these things and are unable to have a bowel movement in the first 3-4 days after surgery call either your surgeon or your primary doctor.    If you experience loose stools or diarrhea, hold the  medications until you stool forms back up.  If your symptoms do not get better within 1 week or if they get worse, check with your doctor.  If you experience "the worst abdominal pain ever" or develop nausea or vomiting, please contact the office immediately for further recommendations for treatment.   ITCHING:  If you experience itching with your medications, try taking only a single pain pill, or even half a pain pill at a time.  You can also use Benadryl over the counter for itching or also to help with sleep.   TED HOSE STOCKINGS:  Use stockings on both legs until for at least 2 weeks or as directed by physician office. They may be removed at night for sleeping.  MEDICATIONS:  See your medication summary on the "After Visit  Summary" that nursing will review with you.  You may have some home medications which will be placed on hold until you complete the course of blood thinner medication.  It is important for you to complete the blood thinner medication as prescribed.  PRECAUTIONS:  If you experience chest pain or shortness of breath - call 911 immediately for transfer to the hospital emergency department.   If you develop a fever greater that 101 F, purulent drainage from wound, increased redness or drainage from wound, foul odor from the wound/dressing, or calf pain - CONTACT YOUR SURGEON.                                                   FOLLOW-UP APPOINTMENTS:  If you do not already have a post-op appointment, please call the office for an appointment to be seen by your surgeon.  Guidelines for how soon to be seen are listed in your "After Visit Summary", but are typically between 1-4 weeks after surgery.  OTHER INSTRUCTIONS:    MAKE SURE YOU:  . Understand these instructions.  . Get help right away if you are not doing well or get worse.    Thank you for letting us be a part of your medical care team.  It is a privilege we respect greatly.  We hope these instructions will help you  stay on track for a fast and full recovery!

## 2014-07-05 NOTE — Consult Note (Signed)
Chaplain met with patient, spent time in counseling, offered prayer, discussed with patient her concerns. Loralyn Freshwater D. Alroy Dust [phone]

## 2014-07-05 NOTE — Progress Notes (Signed)
Physical Therapy Treatment Patient Details Name: LURDES HALTIWANGER MRN: [id number] DOB: 08-30-1930 Today's Date: 07/05/2014    History of Present Illness  Pt 81 year old female with L hip ORIF.     PT Comments    Rx attempted earlier; pt on phone upset and asked to return in a little while. Returned and pt agreeable to PT. "We can try". Pt refuses out of bed/up in bed, but agreeable to bed exercises. Increased pain in L hip continues. Discussed pain level with nursing; pain medication given just before PT. Pt becomes tearful during treatment noting pain and anxiety regarding next step. Concerns with going to rehab and the fact she has no family to help make decisions. Offered Chaplain services to pt; pt refuses. Discussed importance of moving and out of bed for healing, strength, alleviating pain and overall health. Pt states she is agreeable later today.   Follow Up Recommendations  SNF     Equipment Recommendations  Rolling walker with 5" wheels    Recommendations for Other Services       Precautions / Restrictions Restrictions Weight Bearing Restrictions: Yes LLE Weight Bearing: Touchdown weight bearing    Mobility  Bed Mobility               General bed mobility comments: Not tested this a.m. due to pt refusal/painful and tearful disposition  Transfers                 General transfer comment: Pt refused out of bed this a.m  Ambulation/Gait                 Stairs            Wheelchair Mobility    Modified Rankin (Stroke Patients Only)       Balance                                    Cognition Arousal/Alertness: Awake/alert Behavior During Therapy: Anxious Overall Cognitive Status: Within Functional Limits for tasks assessed                      Exercises Total Joint Exercises Ankle Circles/Pumps: AROM;Both;20 reps;Supine Quad Sets: Strengthening;Both;20 reps;Supine Gluteal Sets: Strengthening;Both;20  reps;Supine Towel Squeeze: Strengthening;Both;20 reps;Supine Short Arc Quad: AROM;Both;20 reps;Supine Heel Slides: AAROM;Both;20 reps;Supine Hip ABduction/ADduction: AAROM;Both;20 reps;Supine Straight Leg Raises: AAROM;Both;Supine;10 reps    General Comments        Pertinent Vitals/Pain Pain Assessment: 0-10 Pain Score: 9  Pain Descriptors / Indicators: Constant Pain Intervention(s): Limited activity within patient's tolerance;Monitored during session;Premedicated before session;Ice applied    Home Living                      Prior Function            PT Goals (current goals can now be found in the care plan section) Progress towards PT goals: Progressing toward goals (Slow progress)    Frequency  BID    PT Plan Current plan remains appropriate    Co-evaluation             End of Session   Activity Tolerance: Patient limited by pain Patient left: in bed;with call bell/phone within reach;with bed alarm set;with nursing/sitter in room     Time: 1020-1046 PT Time Calculation (min) (ACUTE ONLY): 26 min  Charges:  $Therapeutic Exercise: 23-37 mins  G Codes:      [name] 07/05/2014, 11:09 AM

## 2014-07-05 NOTE — Clinical Social Work Note (Deleted)
Clinical Social Work Assessment  Patient Details  Name: Lauren Mccann MRN: [id number] Date of Birth: 1930/08/07  Date of referral:  07/05/14               Reason for consult:  Facility Placement, Emotional/Coping/Adjustment to Illness, Insurance Barriers                Permission sought to share information with:  Chartered certified accountant granted to share information::  Yes, Verbal Permission Granted  Name::      [facility]  Agency::   [place]  Contact Information:   Lauren [facility] liaision  Housing/Transportation Living arrangements for the past 2 months:  [place] of Information:  Patient, Friend/Neighbor Patient Interpreter Needed:  None Criminal Activity/Legal Involvement Pertinent to Current Situation/Hospitalization:  No - Comment as needed Significant Relationships:  Adult Children, Spouse Lives with:  Spouse Do you feel safe going back to the place where you live?  Yes Need for family participation in patient care:  Yes (Comment)  Social Worker assessment / plan:  Holiday representative (CSW) met with patient to discuss D/C plan. PT is recommending SNF. Patient's son Lauren Mccann was at bedside during assessment. CSW introduced self and explained role of CSW department. Patient reported that she lives with her husband Lauren Mccann in [place] and her son Lauren Mccann lives near by. Patient reported that she has had a rough 2 months. She has been in and out of the hospital and rehab. Patient D/C'ed from [facility] in February 2016 to [facility] under [place]. During her stay at [facility] patient was sent to [place] denied her SNF stay, in which she could not go back to [facility] and had to return home. Patient was at home a few days from [facility] when a home health PT came to her house checked her vitals and called 911 and is now at [facility]. Patient reported that she is frustrated due to being D/C'ed too early and is going to refuse to D/C from [facility] until she  feels she is ready. Patient reported that if the D/C's from [facility] "too early" her husband will drive her straight back to the ER. CSW provided emotional support and explained SNF process. CSW made patient aware that [employer] could deny patient again for SNF stay. Patient reported that she would like to pursue SNF authorization from [facility]. Patient prefers [facility] and reported that she was making great progress at [facility]. Patient spoke highly of the physical therapist at [facility]. CSW also discussed with patient the D/C plan if [employer] denies SNF stay. Patient is agreeable to going home with home health if [employer]BS denies SNF stay. Patient reported that she has already applied for Medicare and will be 67 this month. Patient reported that she was open to [facility] home health.   FL2 complete and faxed out via carefinder. Clinicals have been uploaded. Lauren [facility] liaison will start [employer] authorization today. CSW will continue to follow and assist as needed.   Lauren Mccann, LCSWA [phone]  Employment status:  Retired Forensic scientist:  Managed Care PT Recommendations:  [place] / Referral to community resources:  Startup  Patient/Family's Response to care:  Patient and son are agreeable to SNF search and prefer [facility]. Patient and son are also agreeable to going home with home health if insurance denies SNF stay.   Emotional Assessment Appearance:  Appears stated age, Well-Groomed Attitude/Demeanor/Rapport:  Other (Calm and pleasant) Affect (typically observed):  Calm, Pleasant Orientation:  Oriented to Self, Oriented to Place, Oriented to  Time, Oriented to Situation Alcohol / Substance use:  Never Used Psych involvement (Current and /or in the community):  Yes (Comment)  Discharge Needs  Concerns to be addressed:  Adjustment to Illness, Financial / Insurance Concerns Readmission within the last 30 days:  Yes Current discharge risk:  Chronically ill, Dependent with  Mobility Barriers to Discharge:  [place], LCSW 07/05/2014, 10:40 AM

## 2014-07-05 NOTE — Progress Notes (Signed)
Pt has been remaining alert and oriented this shift. Minimal pain controled with oral pain medication. Able to sleep in between care. Iv infusing without difficulty. Foley patent and draining urine. Dressing is dry and intact. neuro checks wdl. Jeanie Sewer RN

## 2014-07-05 NOTE — Progress Notes (Signed)
Physical Therapy Treatment Patient Details Name: Lauren [facility]ann MRN: [id number] DOB: May 11, 1930 Today's Date: 07/05/2014    History of Present Illness The patient is an 81 year old female with a history of pulmonary embolism, on Xarelto who tripped and fell, injuring her left hip. She was brought to the Emergency Room where she was found to have a hip fracture.  Pt is s/p hip pinning by Dr. Daine Gip on 07/03/14.    PT Comments    Pt progressing slowly toward goals; refuses out of bed today. Lethargic this p.m. Continue PT to progress strength, bed mobility, transfers, and gait as appropriate. Encouraged additional support for patient concerns, anxiety such as Chaplain; pt continues to refuse.   Follow Up Recommendations  SNF     Equipment Recommendations  Rolling walker with 5" wheels    Recommendations for Other Services       Precautions / Restrictions Restrictions Weight Bearing Restrictions: Yes LLE Weight Bearing: Touchdown weight bearing    Mobility  Bed Mobility Overal bed mobility: Needs Assistance Bed Mobility:  (Pt refused out of bed)           General bed mobility comments: Not tested this a.m. due to pt refusal/painful and tearful disposition  Transfers                 General transfer comment: Pt refused out of bed this a.m  Ambulation/Gait                 Stairs            Wheelchair Mobility    Modified Rankin (Stroke Patients Only)       Balance                                    Cognition Arousal/Alertness: Lethargic Behavior During Therapy: Anxious Overall Cognitive Status: Within Functional Limits for tasks assessed                      Exercises Total Joint Exercises Ankle Circles/Pumps: AROM;Both;20 reps;Supine Quad Sets: Strengthening;Both;20 reps;Supine Gluteal Sets: Strengthening;Both;20 reps;Supine Towel Squeeze: Strengthening;Both;20 reps;Supine Short Arc Quad: AROM;Both;20  reps;Supine Heel Slides: AAROM;Both;20 reps;Supine Hip ABduction/ADduction: AAROM;Both;20 reps;Supine Straight Leg Raises: AAROM;Both;Supine;10 reps    General Comments        Pertinent Vitals/Pain Pain Assessment: 0-10 Pain Score: 8  Pain Location: L hip  Pain Descriptors / Indicators: Constant Pain Intervention(s): Limited activity within patient's tolerance;Premedicated before session    Home Living                      Prior Function            PT Goals (current goals can now be found in the care plan section) Progress towards PT goals: Progressing toward goals (Slow progress)    Frequency  BID    PT Plan Current plan remains appropriate    Co-evaluation             End of Session   Activity Tolerance: Patient limited by pain;Patient limited by lethargy Patient left: in bed;with call bell/phone within reach;with bed alarm set     Time: 1316-1340 PT Time Calculation (min) (ACUTE ONLY): 24 min  Charges:  $Therapeutic Exercise: 23-37 mins                    G Codes:  [place] 07/05/2014, 2:11 PM

## 2014-07-05 NOTE — Progress Notes (Signed)
Clinical Social Worker (CSW) met with patient to answer more questions about SNF. Patient reported that she is concerned about sharing a bathroom with a roommate at [facility]. CSW explained that RN and nurse aide will help patient to the bathroom and if that one is occupied they will take her to another bathroom. CSW provided emotional support and answered additions questions. Plan is for patient to D/C tomorrow to [facility]. CSW will continue to follow and assist as needed.   Blima Rich, [place] [phone]

## 2014-07-05 NOTE — Progress Notes (Signed)
Pt. Iv access bleeding and leaking at site, discontinued. Pt is post op day 2 for discharge tomorrow to [facility] Tolerating diet and voiding without difficulty.

## 2014-07-05 NOTE — Clinical Social Work Placement (Deleted)
   CLINICAL SOCIAL WORK PLACEMENT  NOTE  Date:  07/05/2014  Patient Details  Name: Lauren Mccann MRN: [id number] Date of Birth: 1930/06/19  Clinical Social Work is seeking post-discharge placement for this patient at the [place] level of care (*CSW will initial, date and re-position this form in  chart as items are completed):  Yes   Patient/family provided with [facility] Work Department's list of facilities offering this level of care within the geographic area requested by the patient (or if unable, by the patient's family).  Yes   Patient/family informed of their freedom to choose among providers that offer the needed level of care, that participate in Medicare, Medicaid or managed care program needed by the patient, have an available bed and are willing to accept the patient.  Yes   Patient/family informed of [facility]'s ownership interest in [facility] and [facility], as well as of the fact that they are under no obligation to receive care at these facilities.  PASRR submitted to EDS on       PASRR number received on       Existing PASRR number confirmed on 07/05/14     FL2 transmitted to all facilities in geographic area requested by pt/family on 07/05/14     FL2 transmitted to all facilities within larger geographic area on       Patient informed that his/her managed care company has contracts with or will negotiate with certain facilities, including the following:            Patient/family informed of bed offers received.  Patient chooses bed at       Physician recommends and patient chooses bed at      Patient to be transferred to   on  .  Patient to be transferred to facility by       Patient family notified on   of transfer.  Name of family member notified:        PHYSICIAN       Additional Comment:    _______________________________________________ Loralyn Freshwater, LCSW 07/05/2014, 10:36 AM

## 2014-07-05 NOTE — Progress Notes (Signed)
  Subjective: 2 Days Post-Op Procedure(s) (LRB): CANNULATED HIP PINNING (Left) Patient reports pain as moderate.   Patient seen in rounds with Dr. Daine Gip. Patient is well, and has had no acute complaints or problems Plan is to go Rehab after hospital stay. Negative for chest pain and shortness of breath Fever: no Gastrointestinal:Negative for nausea and vomiting  Objective: Vital signs in last 24 hours: Temp:  [97.7 F (36.5 C)-100.2 F (37.9 C)] 98 F (36.7 C) (05/03 0436) Pulse Rate:  [81-109] 101 (05/03 0436) Resp:  [18] 18 (05/03 0436) BP: (108-133)/(58-78) 131/70 mmHg (05/03 0436) SpO2:  [93 %-97 %] 97 % (05/03 0436)  Intake/Output from previous day:  Intake/Output Summary (Last 24 hours) at 07/05/14 0637 Last data filed at 07/05/14 0446  Gross per 24 hour  Intake   2607 ml  Output   2500 ml  Net    107 ml    Intake/Output this shift: Total I/O In: 1642 [I.V.:1642] Out: 1400 [Urine:1400]  Labs:  Recent Labs  07/02/14 1755 07/03/14 0358  HGB 9.4* 9.2*    Recent Labs  07/02/14 1755 07/03/14 0358  WBC 12.7* 9.7  RBC 3.79* 3.65*  HCT 30.0* 28.9*  PLT 277 255    Recent Labs  07/02/14 1755 07/03/14 0358  NA 138 138  K 4.2 4.5  CL 103 104  CO2 26 27  BUN 16 11  CREATININE 0.98 0.89  GLUCOSE 123* 131*  CALCIUM 9.0 8.6*    Recent Labs  07/02/14 1755  INR 1.2     EXAM General - Patient is Alert and Oriented Extremity - Neurovascular intact Sensation intact distally Dorsiflexion/Plantar flexion intact Incision: no drainage and New dressing applied.   Dressing/Incision - clean, dry, no drainage, tender Motor Function - intact, moving foot and toes well on exam. Mild pain with rolling over.  No past medical history on file.  Assessment/Plan: 2 Days Post-Op Procedure(s) (LRB): CANNULATED HIP PINNING (Left) Active Problems:   Anxiety   Hip fracture requiring operative repair  Estimated body mass index is 30.56 kg/(m^2) as calculated  from the following:   Height as of this encounter: 5' 1.97" (1.574 m).   Weight as of this encounter: 75.7 kg (166 lb 14.2 oz). D/C IV fluids Plan for discharge tomorrow  DVT Prophylaxis - Xarelto Toe Touch Weight-Bearing  to Left leg  Reche Dixon, PA-C Orthopaedic Surgery 07/05/2014, 6:37 AM

## 2014-07-06 LAB — HEMOGLOBIN: Hemoglobin: 8 g/dL — ABNORMAL LOW (ref 12.0–16.0)

## 2014-07-06 MED ORDER — RIVAROXABAN 20 MG PO TABS: 20.0000 mg | ORAL_TABLET | Freq: Every day | ORAL | Status: AC

## 2014-07-06 MED ORDER — ALPRAZOLAM 1 MG PO TABS: 1.0000 mg | ORAL_TABLET | Freq: Three times a day (TID) | ORAL | Status: AC

## 2014-07-06 MED ORDER — RIVAROXABAN 20 MG PO TABS: 20.0000 mg | ORAL_TABLET | Freq: Every day | ORAL | Status: DC

## 2014-07-06 MED ORDER — FERROUS SULFATE 325 (65 FE) MG PO TABS: 325.0000 mg | ORAL_TABLET | Freq: Three times a day (TID) | ORAL | Status: DC

## 2014-07-06 MED ORDER — TRAMADOL HCL 50 MG PO TABS: 50.0000 mg | ORAL_TABLET | ORAL | Status: DC

## 2014-07-06 NOTE — Plan of Care (Signed)
Problem: Consults Goal: Diagnosis- Total Joint Replacement Outcome: Progressing Hemiarthroplasty

## 2014-07-06 NOTE — Progress Notes (Signed)
  Subjective: 3 Days Post-Op Procedure(s) (LRB): CANNULATED HIP PINNING (Left) Patient reports pain as mild.   Patient seen in rounds with Dr. Daine Gip. Patient is well, and has had no acute complaints or problems.  Slept better last night. Plan is to go Rehab after hospital stay.  Possible today. Negative for chest pain and shortness of breath Fever: no Gastrointestinal:Negative for nausea and vomiting  Objective: Vital signs in last 24 hours: Temp:  [97.7 F (36.5 C)-98.9 F (37.2 C)] 97.7 F (36.5 C) (05/04 0410) Pulse Rate:  [90-100] 90 (05/04 0410) Resp:  [18] 18 (05/04 0410) BP: (105-131)/(58-70) 105/65 mmHg (05/04 0410) SpO2:  [92 %-97 %] 92 % (05/04 0410)  Intake/Output from previous day:  Intake/Output Summary (Last 24 hours) at 07/06/14 0627 Last data filed at 07/05/14 1812  Gross per 24 hour  Intake    735 ml  Output    150 ml  Net    585 ml    Intake/Output this shift:    Labs:  Recent Labs  07/05/14 0957 07/06/14 0415  HGB 8.1* 8.0*   No results for input(s): WBC, RBC, HCT, PLT in the last 72 hours. No results for input(s): NA, K, CL, CO2, BUN, CREATININE, GLUCOSE, CALCIUM in the last 72 hours. No results for input(s): LABPT, INR in the last 72 hours.   EXAM General - Patient is Alert and Oriented Extremity - Neurovascular intact Sensation intact distally Dorsiflexion/Plantar flexion intact Incision: no drainage and New dressing applied.     Dressing/Incision - clean, dry, minimal drainage, tender Motor Function - intact, moving foot and toes well on exam. Mild pain with rolling over.  No past medical history on file.  Assessment/Plan: 3 Days Post-Op Procedure(s) (LRB): CANNULATED HIP PINNING (Left) Active Problems:   Anxiety   Hip fracture requiring operative repair  Estimated body mass index is 30.56 kg/(m^2) as calculated from the following:   Height as of this encounter: 5' 1.97" (1.574 m).   Weight as of this encounter: 75.7 kg (166 lb  14.2 oz). Plan to discharge today to SNF  DVT Prophylaxis - Xarelto Toe Touch Weight-Bearing  to Left leg  Reche Dixon, PA-C Orthopaedic Surgery 07/06/2014, 6:27 AM

## 2014-07-06 NOTE — Progress Notes (Signed)
Pt for discharge to [facility] rehab. Report called to [place], Therapist, sports. Pt. Stable and belongings packed, gown changed. EMS notified.

## 2014-07-06 NOTE — Progress Notes (Signed)
Physical Therapy Treatment Patient Details Name: Lauren Mccann MRN: [id number] DOB: [birth year] Today's Date: 07/06/2014    History of Present Illness The patient is an 81 year old female with a history of pulmonary embolism, on Xarelto who tripped and fell, injuring her left hip. She was brought to the Emergency Room where she was found to have a hip fracture.  Pt is s/p hip pinning by Dr. Daine Gip on 07/03/14.    PT Comments    Pt limited to bed exercises due to extreme lethargy this a.m. Required reawakening several times during session. Out of bed not appropriate due to level of lethargy.   Follow Up Recommendations        Equipment Recommendations       Recommendations for Other Services       Precautions / Restrictions Restrictions Weight Bearing Restrictions: Yes LLE Weight Bearing: Touchdown weight bearing    Mobility  Bed Mobility Overal bed mobility:  (Not tested due to level of lethargy )                Transfers                    Ambulation/Gait                 Stairs            Wheelchair Mobility    Modified Rankin (Stroke Patients Only)       Balance                                    Cognition Arousal/Alertness: Lethargic Behavior During Therapy: Flat affect Overall Cognitive Status: Within Functional Limits for tasks assessed                      Exercises Total Joint Exercises Ankle Circles/Pumps: AAROM;Both;20 reps Quad Sets: Strengthening;Both;Supine;10 reps Gluteal Sets: Strengthening;Both;Supine;10 reps Towel Squeeze: Strengthening;Both;Supine;10 reps Short Arc Quad: AAROM;20 reps;Both Heel Slides: AAROM;Both;20 reps;Supine Hip ABduction/ADduction: AAROM;Both;20 reps;Supine Straight Leg Raises: AAROM;Both;Supine;10 reps    General Comments        Pertinent Vitals/Pain Pain Assessment: 0-10 Pain Score: 6  Pain Location: L hip Pain Intervention(s): Limited activity within  patient's tolerance;Monitored during session;Premedicated before session    Home Living                      Prior Function            PT Goals (current goals can now be found in the care plan section)      Frequency       PT Plan      Co-evaluation             End of Session   Activity Tolerance: Patient limited by lethargy Patient left: in bed;with call bell/phone within reach;with bed alarm set     Time: 4628-6381 PT Time Calculation (min) (ACUTE ONLY): 24 min  Charges:                       G Codes:      [name] 07/06/2014, 10:52 AM

## 2014-07-06 NOTE — Discharge Summary (Signed)
Physician Discharge Summary  Lauren Mccann JJH:[id number] DOB: May 13, 1930 DOA: 07/02/2014  PCP: Juluis Pitch Admit date: 07/02/2014 Discharge date: 07/06/2014  Time spent: 45 minutes  Recommendations for Outpatient Follow-up:  1. Orthopedic surgery 2 weeks 2. Dr. at [facility] rehabilitation 1-2 days 3. PT and occupational therapy at rehabilitation.  Discharge Diagnoses:  Active Problems:   Anxiety   Hip fracture requiring operative repair   Discharge Condition: Satisfactory  Diet recommendation: Regular  Filed Weights   07/02/14 2003  Weight: 75.7 kg (166 lb 14.2 oz)    History of present illness:  Patient presented with a fall and found to have a left hip fracture.  Hospital Course:  #1 left hip fracture closed initial consultation requiring operative repair. -Please see operative report by orthopedic surgery. -Patient stable for discharge to rehabilitation. #2 postoperative anemia. -Patient does have a history of anemia anyway. Hemoglobin down to 8.0 upon discharge. No indications for transfusion at this time. Ferrous sulfate prescribed. #3 history of pulmonary embolism. -On Xarelto. #4 tremor-none seen today. #5 essential hypertension-stable on diltiazem #6 anxiety patient is on Xanax. #7 gastroesophageal reflux disease without esophagitis- patient is on Nexium.   Discharge Exam: Filed Vitals:   07/06/14 0824  BP: 102/57  Pulse: 87  Temp: 98 F (36.7 C)  Resp: 18    Physical Exam  HENT:  Nose: No mucosal edema.  Mouth/Throat: No oropharyngeal exudate or posterior oropharyngeal edema.  Eyes: Conjunctivae, EOM and lids are normal. Pupils are equal, round, and reactive to light.  Neck: No JVD present. Carotid bruit is not present. No edema present. No thyroid mass and no thyromegaly present.  Cardiovascular: S1 normal and S2 normal.  Exam reveals no gallop.   No murmur heard. Pulses:      Dorsalis pedis pulses are 2+ on the right side, and 2+ on the  left side.  Respiratory: No respiratory distress. She has no wheezes. She has no rhonchi. She has no rales.  GI: Soft. Bowel sounds are normal. There is no tenderness.  Musculoskeletal:       Right shoulder: She exhibits no swelling.  Lymphadenopathy:    She has no cervical adenopathy.  Neurological: She is alert. No cranial nerve deficit.  Skin: Skin is warm. No rash noted. Nails show no clubbing.  Psychiatric: She has a normal mood and affect.   extremity exam continued: Patient still have difficulty lifting the left leg up off the bed. She is able to flex at the ankle and wiggle toes without a problem. Sensation intact left lower extremity to light touch.  Discharge Instructions    Current Discharge Medication List    START taking these medications   Details  ferrous sulfate 325 (65 FE) MG tablet Take 1 tablet (325 mg total) by mouth 3 (three) times daily with meals. Qty: 90 tablet, Refills: 3      CONTINUE these medications which have CHANGED   Details  ALPRAZolam (XANAX) 1 MG tablet Take 1 tablet (1 mg total) by mouth 3 (three) times daily. As needed for anxiety, nervousness Qty: 30 tablet, Refills: 0    rivaroxaban (XARELTO) 20 MG TABS tablet Take 1 tablet (20 mg total) by mouth daily. Qty: 30 tablet, Refills: 1    traMADol (ULTRAM) 50 MG tablet Take 1 tablet (50 mg total) by mouth every 4 (four) hours. As needed for pain Qty: 30 tablet, Refills: 0      CONTINUE these medications which have NOT CHANGED   Details  diltiazem (CARDIZEM)  30 MG tablet Take 30 mg by mouth 2 (two) times daily.    diphenoxylate-atropine (LOMOTIL) 2.5-0.025 MG per tablet Take 1 tablet by mouth 3 (three) times daily. As needed for diarrhea    esomeprazole (NEXIUM) 40 MG capsule Take 1 capsule by mouth every morning.    ondansetron (ZOFRAN) 4 MG tablet Take 1 tablet by mouth. Every 4 to 6 hours as needed for nausea, vomiting    sertraline (ZOLOFT) 100 MG tablet Take 1 tablet by mouth daily.     acetaminophen (TYLENOL) 325 MG tablet Take by mouth. 1 to 2 tablets by mouth every 4 hours as needed for pain    gabapentin (NEURONTIN) 100 MG capsule Take 2 capsules by mouth 3 (three) times daily.       Allergies  Allergen Reactions  . Propoxyphene Shortness Of Breath    Other reaction(s): Other (See Comments) GI Upset  . Alendronate Sodium     Other reaction(s): Other (See Comments) GI Upset  . Atorvastatin     Other reaction(s): Other (See Comments) Myalgia  . Benadryl [Diphenhydramine Hcl] Other (See Comments)    Restless legs  . Codeine     Other reaction(s): Other (See Comments) Chest Tightness, SOB, Tachycardia GI Upset  Pt states "makes heart flutter and trouble breathing"  . Cymbalta [Duloxetine Hcl]   . Levofloxacin Other (See Comments)    Altered mental status  . Prednisone     Other reaction(s): Other (See Comments) Chest Pain, SOB, "can't breathe good" Unable to sleep   Follow-up Information    Follow up with doctor at rehab In 1 day.      Follow up with orthopedics In 2 weeks.       The results of significant diagnostics from this hospitalization (including imaging, microbiology, ancillary and laboratory) are listed below for reference.    Significant Diagnostic Studies: Dg Fluoro Rm Gt 60 Min  07/10/14   CLINICAL DATA:  Left hip fracture  EXAM: FLOURO RM GT 60 MIN; LEFT HIP (WITH PELVIS) 2-3 VIEWS  FLUOROSCOPY TIME:  Fluoroscopy Time (in minutes and seconds): 1 minutes 58 seconds  Number of Acquired Images:  2  COMPARISON:  CT left hip dated 07/02/2014  FINDINGS: Intraoperative fluoroscopic images during ORIF of a subcapital left proximal femur fracture.  Three cannulated cancellous screws transfix the fracture, which is in near anatomic alignment and position.  IMPRESSION: Intraoperative fluoroscopic images during ORIF of a subcapital left proximal femur fracture, as above.   Electronically Signed   By: Julian Hy M.D.   On: 10-Jul-2014 10:28    Ct Hip Left Wo Contrast  07/02/2014   CLINICAL DATA:  Further evaluation of known left femur fracture  EXAM: CT OF THE LEFT HIP WITHOUT CONTRAST  TECHNIQUE: Multidetector CT imaging of the left hip was performed according to the standard protocol. Multiplanar CT image reconstructions were also generated.  COMPARISON:  07/02/2014  FINDINGS: There is a fracture there is mild impaction at the fracture site. No significant joint effusion or hematoma. Pubic rami are intact. No evidence of femoral dislocation. Of the proximal femur are at the junction of the femoral neck and femoral head.  IMPRESSION: Fracture the proximal left femur best classified as subcapital.   Electronically Signed   By: Skipper Cliche M.D.   On: 07/02/2014 18:30   Dg Hip Unilat With Pelvis 2-3 Views Left  07-10-2014   CLINICAL DATA:  Left hip fracture. Status post internal fixation. Initial encounter.  EXAM: LEFT HIP (WITH  PELVIS) 2-3 VIEWS  COMPARISON:  07/03/2014  FINDINGS: Three internal fixation screws are seen transfixing a left femoral neck fracture in near anatomic alignment. No evidence other fracture or dislocation.  IMPRESSION: Internal fixation of left femoral neck fracture in near anatomic alignment.   Electronically Signed   By: Earle Gell M.D.   On: 07/03/2014 12:39   Dg Hip Unilat With Pelvis 2-3 Views Left  07/03/2014   CLINICAL DATA:  Left hip fracture  EXAM: FLOURO RM GT 60 MIN; LEFT HIP (WITH PELVIS) 2-3 VIEWS  FLUOROSCOPY TIME:  Fluoroscopy Time (in minutes and seconds): 1 minutes 58 seconds  Number of Acquired Images:  2  COMPARISON:  CT left hip dated 07/02/2014  FINDINGS: Intraoperative fluoroscopic images during ORIF of a subcapital left proximal femur fracture.  Three cannulated cancellous screws transfix the fracture, which is in near anatomic alignment and position.  IMPRESSION: Intraoperative fluoroscopic images during ORIF of a subcapital left proximal femur fracture, as above.   Electronically Signed   By:  Julian Hy M.D.   On: 07/03/2014 10:28   Ct Head Limited W/o Cm  07/02/2014   CLINICAL DATA:  Tripped at home hit the back of head in a fall no loss of consciousness on anti coagulation  EXAM: CT HEAD WITHOUT CONTRAST  TECHNIQUE: Contiguous axial images were obtained from the base of the skull through the vertex without intravenous contrast.  COMPARISON:  01/02/2014  FINDINGS: Posterior right scalp hematoma. No skull fracture. No hemorrhage or extra-axial fluid. Stable diffuse atrophy and low attenuation in the white matter diffusely. No evidence of mass. No hydrocephalus.  IMPRESSION: Scalp hematoma with no acute intracranial abnormalities.   Electronically Signed   By: Skipper Cliche M.D.   On: 07/02/2014 16:45    Labs: Basic Metabolic Panel:  Recent Labs Lab 06/30/14 1352 07/02/14 1755 07/03/14 0358  NA 136 138 138  K 3.8 4.2 4.5  CL 103 103 104  CO2 25 26 27   GLUCOSE 91 123* 131*  BUN 17 16 11   CREATININE 1.02* 0.98 0.89  CALCIUM 9.1 9.0 8.6*    CBC:  Recent Labs Lab 06/30/14 1352 07/02/14 1755 07/03/14 0358 07/05/14 0957 07/06/14 0415  WBC 8.2 12.7* 9.7  --   --   NEUTROABS 4.1  --   --   --   --   HGB 9.8* 9.4* 9.2* 8.1* 8.0*  HCT 31.1* 30.0* 28.9*  --   --   MCV 79* 79* 79.3*  --   --   PLT 328 277 255  --   --     Signed:  Loletha Grayer  07/06/2014, 8:29 AM

## 2014-07-06 NOTE — Discharge Instructions (Signed)
Hip Fracture °A hip fracture is a fracture of the upper part of your thigh bone (femur).  °CAUSES °A hip fracture is caused by a direct blow to the side of your hip. This is usually the result of a fall but can occur in other circumstances, such as an automobile accident. °RISK FACTORS °There is an increased risk of hip fractures in people with: °· An unsteady walking pattern (gait) and those with conditions that contribute to poor balance, such as Parkinson's disease or dementia. °· Osteopenia and osteoporosis. °· Cancer that spreads to the leg bones. °· Certain metabolic diseases. °SYMPTOMS  °Symptoms of hip fracture include: °· Pain over the injured hip. °· Inability to put weight on the leg in which the fracture occurred (although, some patients are able to walk after a hip fracture). °· Toes and foot of the affected leg point outward when you lie down. °DIAGNOSIS °A physical exam can determine if a hip fracture is likely to have occurred. X-ray exams are needed to confirm the fracture and to look for other injuries. The X-ray exam can help to determine the type of hip fracture. Rarely, the fracture is not visible on an X-ray image and a CT scan or MRI will have to be done. °TREATMENT  °The treatment for a fracture is usually surgery. This means using a screw, nail, or rod to hold the bones in place.  °HOME CARE INSTRUCTIONS °Take all medicines as directed by your health care provider. °SEEK MEDICAL CARE IF: °Pain continues, even after taking pain medicine. °MAKE SURE YOU: °· Understand these instructions.   °· Will watch your condition. °· Will get help right away if you are not doing well or get worse. °Document Released: 02/18/2005 Document Revised: 02/23/2013 Document Reviewed: 09/30/2012 °ExitCare® Patient Information ©2015 [employer]. This information is not intended to replace advice given to you by your health care provider. Make sure you discuss any questions you have with your health care  provider. ° °

## 2014-07-06 NOTE — Progress Notes (Signed)
Patient is medically stable for D/C to [facility] today. Per Kim admissions coordinator at [facility] patient is going to room 220-B. RN will call report at [phone] and arrange EMS for transport. Clinical Education officer, museum (CSW) prepared D/C packet and sent D/C Summary and Ortho D/C Instructions to [facility] via [facility]. Patient is aware of above. CSW contacted patient's daughter in law Darla and made her aware of above. Please reconsult if future social work needs arise. CSW signing off.   Blima Rich, [place] [phone]

## 2014-07-06 NOTE — Progress Notes (Addendum)
Pt is alert and oriented but lethargic. Sleeping between care. Positive BM, voiding without difficulty, incontinent of urine. VSS on 1L O2. Pain improved with PO pain meds on MAR, complaining of headache. Pt requested pain medication after tylenol given, but was sleeping on reassessment. Plan for discharge today to [facility]. Will continue to monitor.

## 2014-07-11 ENCOUNTER — Encounter: Payer: Self-pay | Admitting: Orthopedic Surgery

## 2014-07-19 ENCOUNTER — Encounter
Admission: RE | Admit: 2014-07-19 | Discharge: 2014-07-19 | Disposition: A | Discharge status: Home / Self Care | Payer: Medicare Other | Source: Ambulatory Visit | Attending: Internal Medicine | Admitting: Internal Medicine

## 2014-07-19 DIAGNOSIS — F329 Major depressive disorder, single episode, unspecified: Secondary | ICD-10-CM | POA: Diagnosis not present

## 2014-07-19 DIAGNOSIS — D649 Anemia, unspecified: Secondary | ICD-10-CM | POA: Diagnosis not present

## 2014-07-19 DIAGNOSIS — I10 Essential (primary) hypertension: Secondary | ICD-10-CM | POA: Diagnosis not present

## 2014-07-19 LAB — CBC WITH DIFFERENTIAL/PLATELET
BASOS ABS: 0.1 10*3/uL (ref 0–0.1)
BASOS PCT: 1 %
EOS ABS: 0.3 10*3/uL (ref 0–0.7)
Eosinophils Relative: 4 %
HCT: 27.5 % — ABNORMAL LOW (ref 35.0–47.0)
HEMOGLOBIN: 8.5 g/dL — AB (ref 12.0–16.0)
Lymphocytes Relative: 21 %
Lymphs Abs: 1.8 10*3/uL (ref 1.0–3.6)
MCH: 24.7 pg — AB (ref 26.0–34.0)
MCHC: 30.9 g/dL — AB (ref 32.0–36.0)
MCV: 79.8 fL — ABNORMAL LOW (ref 80.0–100.0)
Monocytes Absolute: 1 10*3/uL — ABNORMAL HIGH (ref 0.2–0.9)
Monocytes Relative: 11 %
NEUTROS PCT: 63 %
Neutro Abs: 5.7 10*3/uL (ref 1.4–6.5)
Platelets: 479 10*3/uL — ABNORMAL HIGH (ref 150–440)
RBC: 3.45 MIL/uL — ABNORMAL LOW (ref 3.80–5.20)
RDW: 18.1 % — AB (ref 11.5–14.5)
WBC: 8.9 10*3/uL (ref 3.6–11.0)

## 2014-07-19 LAB — COMPREHENSIVE METABOLIC PANEL
ALBUMIN: 3.1 g/dL — AB (ref 3.5–5.0)
ALT: 16 U/L (ref 14–54)
ANION GAP: 7 (ref 5–15)
AST: 23 U/L (ref 15–41)
Alkaline Phosphatase: 100 U/L (ref 38–126)
BUN: 23 mg/dL — ABNORMAL HIGH (ref 6–20)
CALCIUM: 9 mg/dL (ref 8.9–10.3)
CO2: 30 mmol/L (ref 22–32)
CREATININE: 0.95 mg/dL (ref 0.44–1.00)
Chloride: 102 mmol/L (ref 101–111)
GFR calc Af Amer: >60 mL/min (ref >60)
GFR calc non Af Amer: 55 mL/min — ABNORMAL LOW (ref >60)
GLUCOSE: 103 mg/dL — AB (ref 65–99)
POTASSIUM: 4.4 mmol/L (ref 3.5–5.1)
SODIUM: 139 mmol/L (ref 135–145)
TOTAL PROTEIN: 7.1 g/dL (ref 6.5–8.1)
Total Bilirubin: 0.3 mg/dL (ref 0.3–1.2)

## 2014-07-19 LAB — VITAMIN B12: Vitamin B-12: 260 pg/mL (ref 180–914)

## 2014-07-19 LAB — TSH: TSH: 2.808 u[IU]/mL (ref 0.350–4.500)

## 2014-07-23 LAB — VITAMIN D 1,25 DIHYDROXY
VITAMIN D 1, 25 (OH) TOTAL: 38 pg/mL
Vitamin D2 1, 25 (OH)2: <10 pg/mL
Vitamin D3 1, 25 (OH)2: 34 pg/mL

## 2014-07-28 ENCOUNTER — Ambulatory Visit: Payer: Medicaid Other

## 2014-08-03 ENCOUNTER — Encounter
Admission: RE | Admit: 2014-08-03 | Discharge: 2014-08-03 | Disposition: A | Discharge status: Home / Self Care | Payer: Medicare Other | Source: Ambulatory Visit | Attending: Internal Medicine | Admitting: Internal Medicine

## 2014-08-07 DIAGNOSIS — S72002D Fracture of unspecified part of neck of left femur, subsequent encounter for closed fracture with routine healing: Secondary | ICD-10-CM | POA: Insufficient documentation

## 2014-08-07 HISTORY — DX: Fracture of unspecified part of neck of left femur, subsequent encounter for closed fracture with routine healing: S72.002D

## 2014-08-25 ENCOUNTER — Other Ambulatory Visit: Payer: Medicaid Other

## 2014-08-25 ENCOUNTER — Ambulatory Visit: Payer: Medicaid Other

## 2014-08-25 ENCOUNTER — Ambulatory Visit: Payer: Medicaid Other | Admitting: Oncology

## 2014-09-21 ENCOUNTER — Other Ambulatory Visit: Payer: Self-pay | Admitting: *Deleted

## 2014-09-21 DIAGNOSIS — D3A8 Other benign neuroendocrine tumors: Secondary | ICD-10-CM

## 2014-09-22 ENCOUNTER — Inpatient Hospital Stay: Payer: Medicare Other | Admitting: Oncology

## 2014-09-22 ENCOUNTER — Inpatient Hospital Stay: Payer: Medicare Other

## 2014-11-01 DIAGNOSIS — D509 Iron deficiency anemia, unspecified: Secondary | ICD-10-CM | POA: Insufficient documentation

## 2014-11-08 ENCOUNTER — Other Ambulatory Visit: Payer: Self-pay | Admitting: Orthopedic Surgery

## 2014-11-08 DIAGNOSIS — M5416 Radiculopathy, lumbar region: Secondary | ICD-10-CM

## 2014-11-14 ENCOUNTER — Ambulatory Visit: Payer: Medicare Other

## 2014-11-16 ENCOUNTER — Ambulatory Visit
Admission: RE | Admit: 2014-11-16 | Discharge: 2014-11-16 | Disposition: A | Discharge status: Home / Self Care | Payer: Medicare Other | Source: Ambulatory Visit | Attending: Orthopedic Surgery | Admitting: Orthopedic Surgery

## 2014-11-16 DIAGNOSIS — M4726 Other spondylosis with radiculopathy, lumbar region: Secondary | ICD-10-CM | POA: Insufficient documentation

## 2014-11-16 DIAGNOSIS — M5416 Radiculopathy, lumbar region: Secondary | ICD-10-CM | POA: Diagnosis present

## 2014-12-11 IMAGING — CT CT ABD-PELV W/ CM
1 of 2 series · 14 of 32 positions shown, 18 images · non-contrast
Comparison: none

REASON FOR EXAM: (1) abd pain / vomiting s/p right colectomy; (2) abd
pain / vomiting s/p right c
COMMENTS:

[Series 2: 3mm soft tissue · axial · 0.77mm/px · z∈[-474,-78]mm · 14 of 144 slices shown, 18 images]
[im 6/144  soft-tissue]
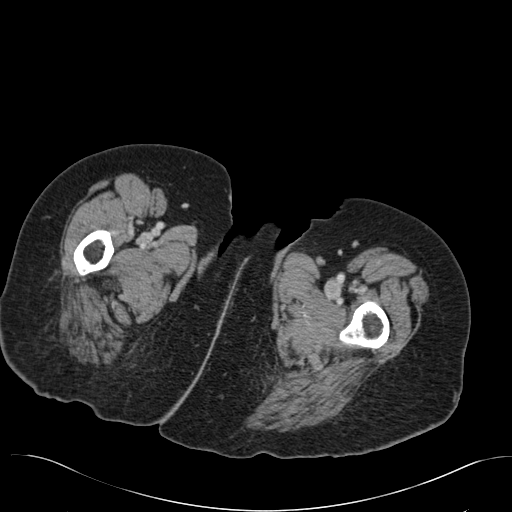
[im 6/144  bone]
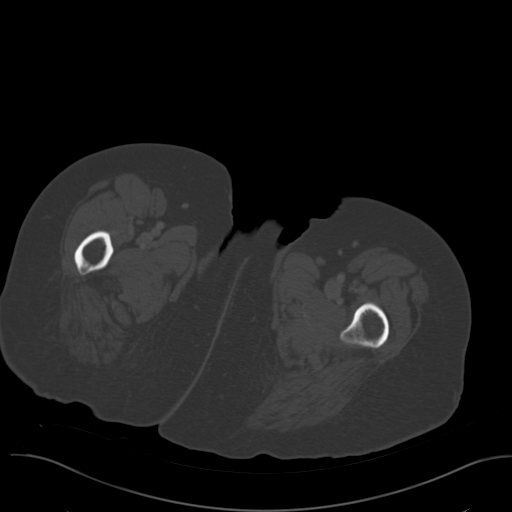
[im 18/144  soft-tissue]
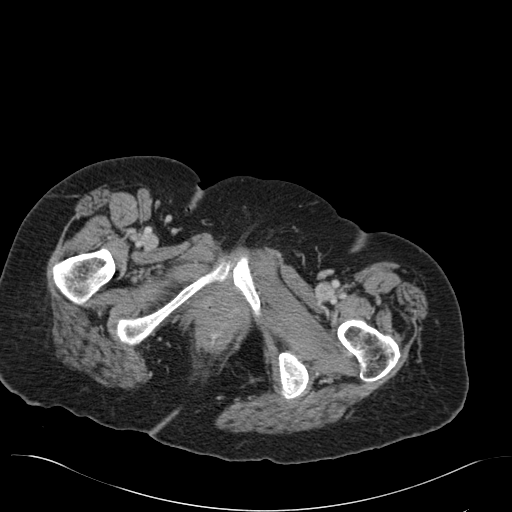
[im 30/144  soft-tissue]
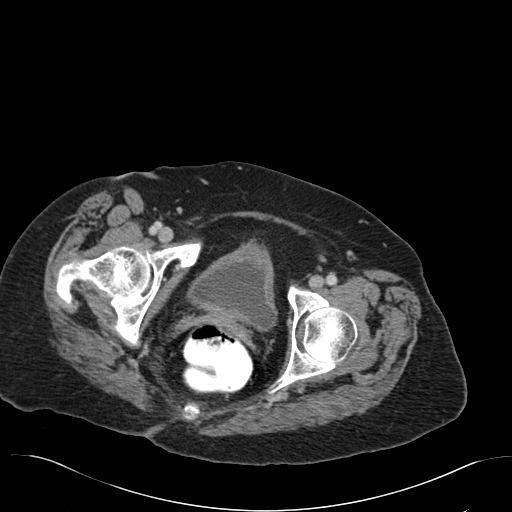
[im 42/144  soft-tissue]
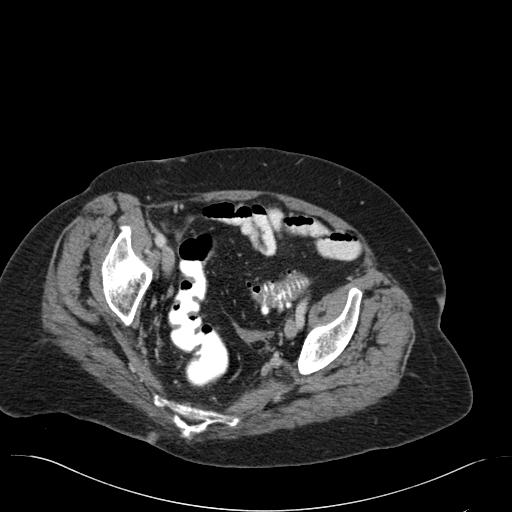
[im 54/144  soft-tissue]
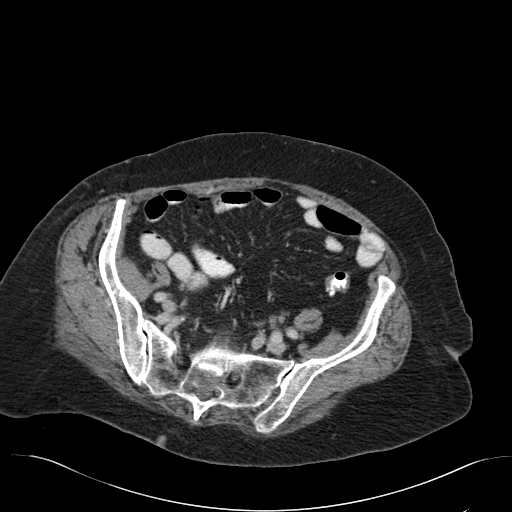
[im 66/144  soft-tissue]
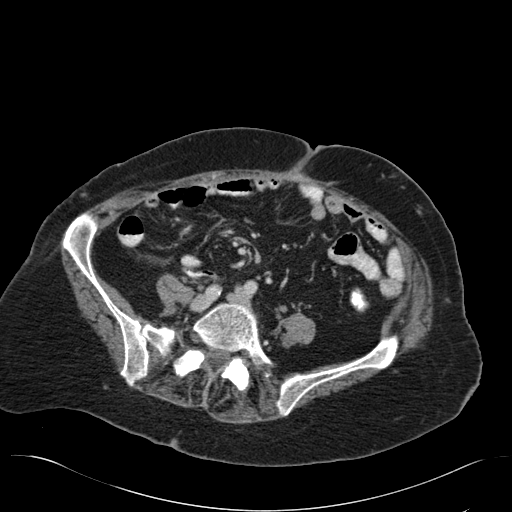
[im 78/144  soft-tissue]
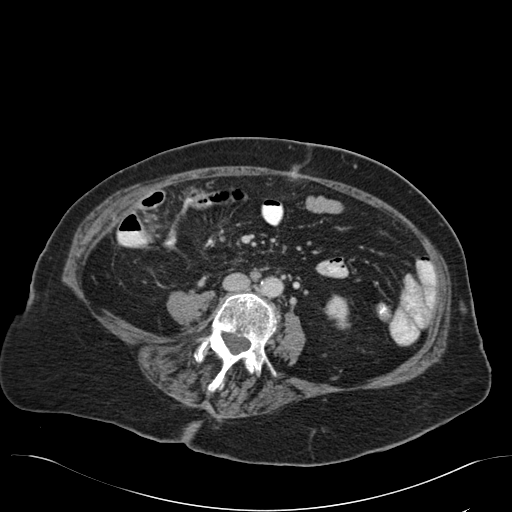
[im 90/144  soft-tissue]
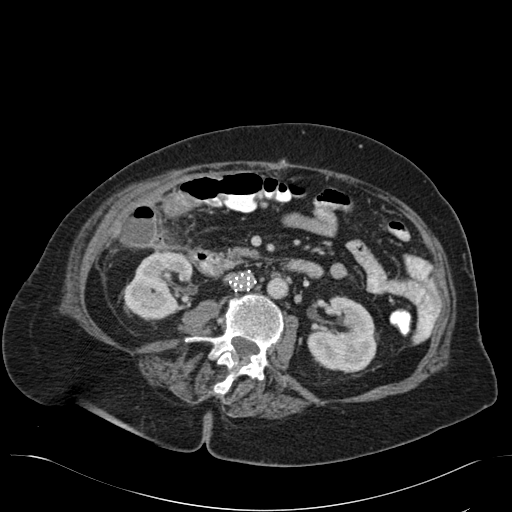
[im 102/144  soft-tissue]
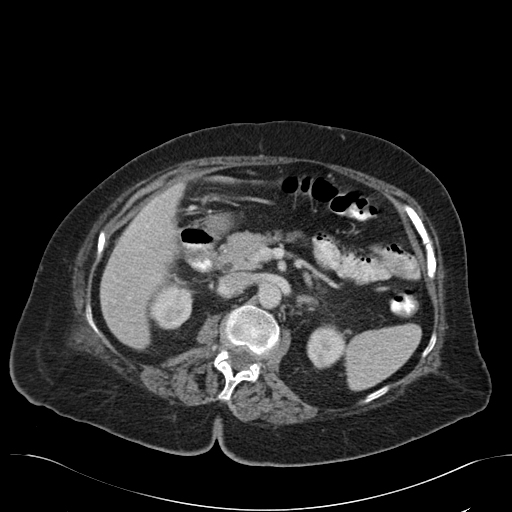
[im 102/144  bone]
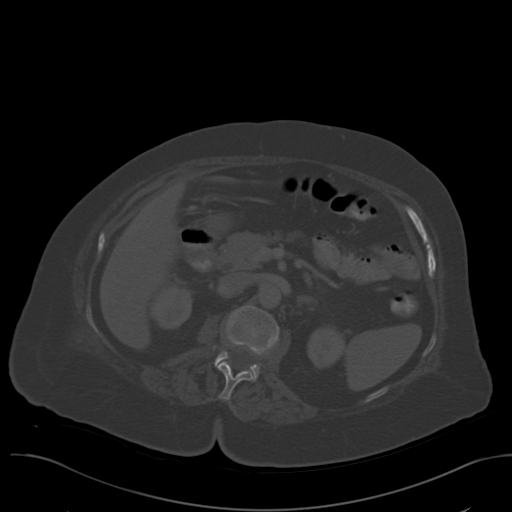
[im 114/144  soft-tissue]
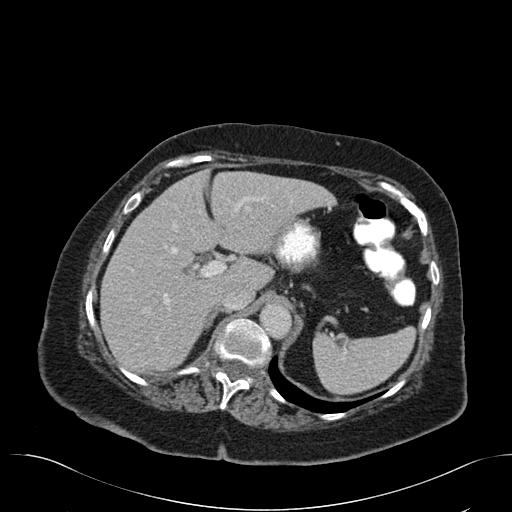
[im 120/144  lung]
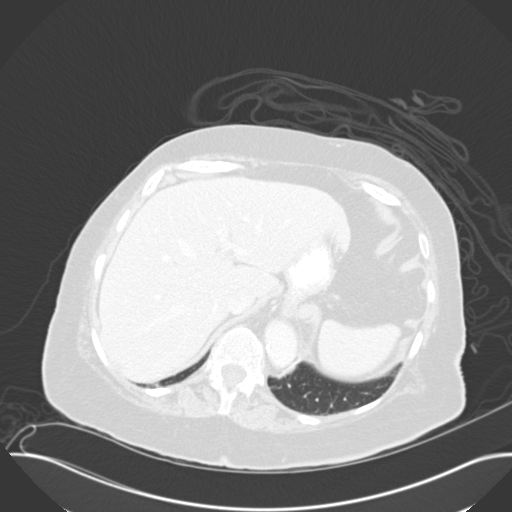
[im 126/144  soft-tissue]
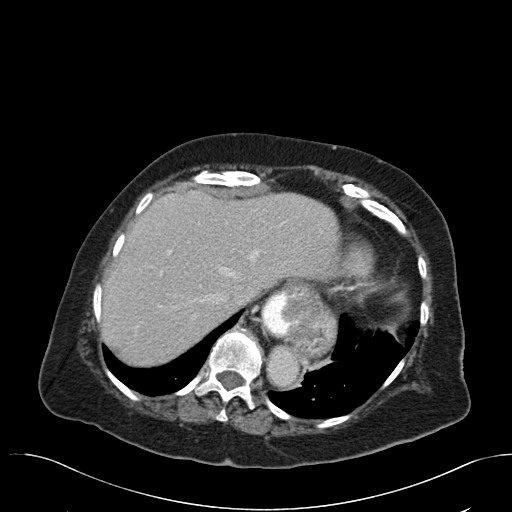
[im 126/144  lung]
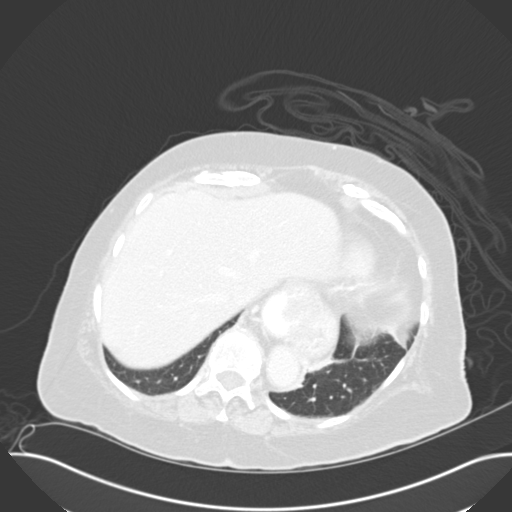
[im 132/144  lung]
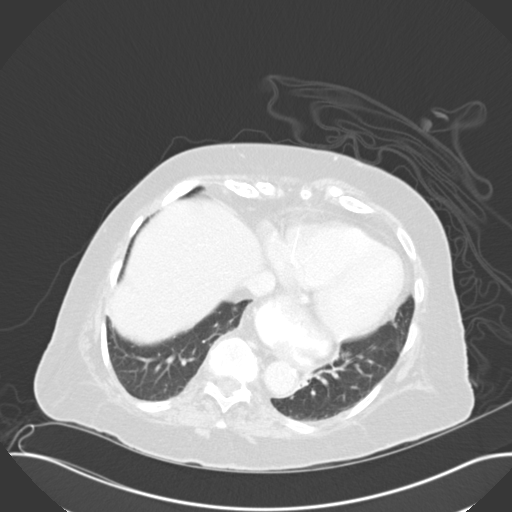
[im 138/144  soft-tissue]
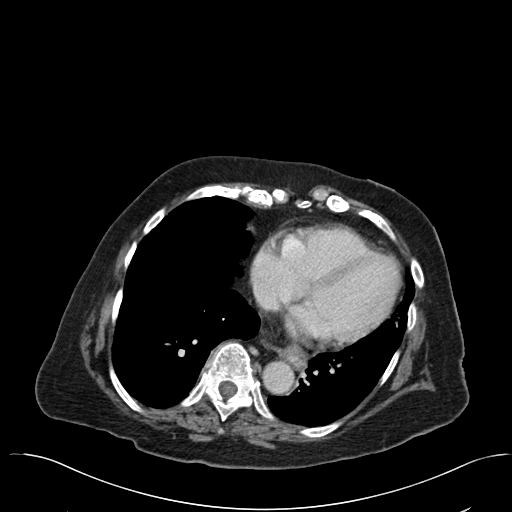
[im 138/144  lung]
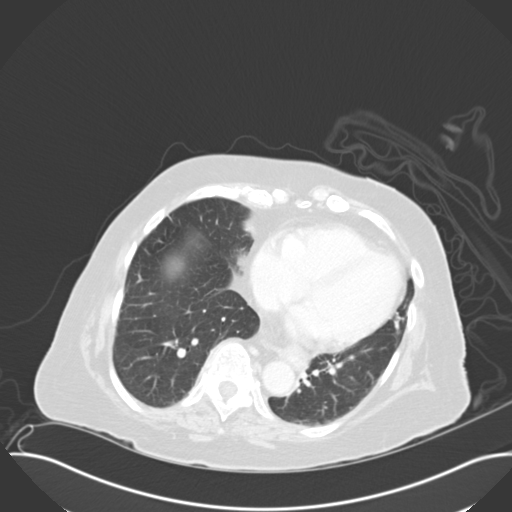

[14 of 32 positions shown; findings below may reference images not displayed]

PROCEDURE:     CT  - CT ABDOMEN / PELVIS  W  - November 09, 2012  [DATE]

RESULT:     CT of the abdomen and pelvis is performed with 100 mL of
Rsovue-MSS iodinated intravenous contrast with oral contrast. Images
reconstructed at 3.0 mm slice thickness in the axial plane. Comparison is
made to the study of 10/16/2012.

In the area of the subhepatic region and previous splenic flexure there is
fluid and air collection along the abdominal wall measuring approximately
2.86 cm anterior to posterior x 3.68 cm transversely on image 52. There is
some adjacent surgical staple line medial to this. There is an adjacent
smaller fluid collection on image 60 it may be connected measuring
approximately 3.06 cm anterior to posterior anterior to the lower pole the
right kidney but appears to connect with the more lateral and superior
collection in the area of image 59. This is presumably postoperative
abscess. There is colonic diverticulosis without definite evidence of acute
diverticulitis in the sigmoid region with a diverticulosis is most
prominent. There is no abnormal bowel distention. There is no extravasation
of oral contrast.

There is an inferior vena cava filter present. The aorta is normal in
caliber with scattered atherosclerotic calcification. There is a small
amount of free fluid present in the pelvis. The urinary bladder contains a
tiny amount of urine. The wall is thickened and this may be artifactual.
Correlate with urinalysis to assess for underlying urinary tract infection.
The kidneys show no obstruction. The adrenal glands and pancreas appear to
be unremarkable. The liver and spleen are within normal limits. A moderately
large hiatal hernia is present.
IMPRESSION: Right hemicolectomy with postoperative abscess inferior to
the right lobe of the liver an anterior to the midportion to lower portion
of the right kidney abutting the area of anastomosis. Surgical consultation
is recommended. Diverticulosis without evidence of acute diverticulitis.
There is a small amount of ascites. The urinary bladder wall is thickened.
This may be artifactual given the low volume.

[REDACTED](*)

## 2014-12-27 ENCOUNTER — Inpatient Hospital Stay: Payer: Medicare Other | Admitting: Oncology

## 2014-12-27 ENCOUNTER — Inpatient Hospital Stay: Payer: Medicare Other | Attending: Oncology

## 2014-12-27 ENCOUNTER — Inpatient Hospital Stay: Payer: Medicare Other

## 2015-01-05 ENCOUNTER — Ambulatory Visit: Payer: Medicare Other

## 2015-01-05 ENCOUNTER — Ambulatory Visit: Payer: Medicare Other | Admitting: Oncology

## 2015-01-05 ENCOUNTER — Other Ambulatory Visit: Payer: Medicare Other

## 2015-01-05 ENCOUNTER — Inpatient Hospital Stay: Payer: Medicare Other

## 2015-01-05 ENCOUNTER — Inpatient Hospital Stay: Payer: Medicare Other | Attending: Oncology

## 2015-01-05 ENCOUNTER — Inpatient Hospital Stay: Payer: Medicare Other | Admitting: Oncology

## 2015-01-06 ENCOUNTER — Encounter: Payer: Self-pay | Admitting: *Deleted

## 2015-01-12 ENCOUNTER — Telehealth: Payer: Self-pay | Admitting: *Deleted

## 2015-01-12 NOTE — Telephone Encounter (Signed)
-----   Message from Cephus Richer sent at 01/12/2015 10:58 AM EST ----- Contact: [phone] Per pt is very upset. She has fallen several time and had broken her hip. She said she has call us every time she has cancel appt. .  The letter we sent  upset her . She doesn't want to lose Dr. Oliva Bustard as her MD and want [place] to not to release her from Cancer center. She will call to r/s once she is able to return. Pt hasn't driven since May.

## 2015-01-12 NOTE — Telephone Encounter (Signed)
MD aware

## 2015-01-30 ENCOUNTER — Telehealth: Payer: Self-pay | Admitting: *Deleted

## 2015-01-30 NOTE — Telephone Encounter (Signed)
called to report she has had diarrhea for 3 days and asking if she needs  A shot she used to get here. I called and told Ashleigh that pt has not come to multiple appts and was to call us when she is able to come in to be seen. She asked if we could see her on Wed when she is going to be out for another appt. I transferred her to the schedulers

## 2015-02-01 ENCOUNTER — Inpatient Hospital Stay: Payer: Medicare Other | Admitting: Oncology

## 2015-02-02 ENCOUNTER — Ambulatory Visit: Payer: Medicare Other | Admitting: Oncology

## 2015-02-08 ENCOUNTER — Inpatient Hospital Stay: Payer: Medicare Other | Attending: Oncology | Admitting: Oncology

## 2015-02-08 ENCOUNTER — Inpatient Hospital Stay: Payer: Medicare Other

## 2015-02-08 ENCOUNTER — Encounter: Payer: Self-pay | Admitting: Oncology

## 2015-02-08 VITALS — BP 116/73 | HR 88 | Temp 97.9°F | Resp 18 | Wt 145.2 lb

## 2015-02-08 DIAGNOSIS — E340 Carcinoid syndrome: Secondary | ICD-10-CM | POA: Diagnosis present

## 2015-02-08 DIAGNOSIS — Z86711 Personal history of pulmonary embolism: Secondary | ICD-10-CM | POA: Insufficient documentation

## 2015-02-08 DIAGNOSIS — D120 Benign neoplasm of cecum: Secondary | ICD-10-CM | POA: Diagnosis not present

## 2015-02-08 DIAGNOSIS — M797 Fibromyalgia: Secondary | ICD-10-CM | POA: Diagnosis not present

## 2015-02-08 DIAGNOSIS — K589 Irritable bowel syndrome without diarrhea: Secondary | ICD-10-CM | POA: Insufficient documentation

## 2015-02-08 DIAGNOSIS — F419 Anxiety disorder, unspecified: Secondary | ICD-10-CM | POA: Diagnosis not present

## 2015-02-08 DIAGNOSIS — Z90710 Acquired absence of both cervix and uterus: Secondary | ICD-10-CM | POA: Insufficient documentation

## 2015-02-08 DIAGNOSIS — R197 Diarrhea, unspecified: Secondary | ICD-10-CM | POA: Diagnosis not present

## 2015-02-08 DIAGNOSIS — D509 Iron deficiency anemia, unspecified: Secondary | ICD-10-CM | POA: Insufficient documentation

## 2015-02-08 DIAGNOSIS — D3A8 Other benign neuroendocrine tumors: Secondary | ICD-10-CM

## 2015-02-08 DIAGNOSIS — Z9011 Acquired absence of right breast and nipple: Secondary | ICD-10-CM | POA: Insufficient documentation

## 2015-02-08 DIAGNOSIS — R5383 Other fatigue: Secondary | ICD-10-CM | POA: Diagnosis not present

## 2015-02-08 DIAGNOSIS — R531 Weakness: Secondary | ICD-10-CM | POA: Diagnosis not present

## 2015-02-08 DIAGNOSIS — Z79899 Other long term (current) drug therapy: Secondary | ICD-10-CM | POA: Diagnosis not present

## 2015-02-08 LAB — CBC WITH DIFFERENTIAL/PLATELET
Basophils Absolute: 0 10*3/uL (ref 0–0.1)
Basophils Relative: 0 %
Eosinophils Absolute: 0.2 10*3/uL (ref 0–0.7)
Eosinophils Relative: 2 %
HCT: 39 % (ref 35.0–47.0)
Hemoglobin: 12.6 g/dL (ref 12.0–16.0)
LYMPHS ABS: 2.3 10*3/uL (ref 1.0–3.6)
Lymphocytes Relative: 27 %
MCH: 28.7 pg (ref 26.0–34.0)
MCHC: 32.3 g/dL (ref 32.0–36.0)
MCV: 89 fL (ref 80.0–100.0)
Monocytes Absolute: 1 10*3/uL — ABNORMAL HIGH (ref 0.2–0.9)
Monocytes Relative: 11 %
Neutro Abs: 5.3 10*3/uL (ref 1.4–6.5)
Neutrophils Relative %: 60 %
Platelets: 194 10*3/uL (ref 150–440)
RBC: 4.38 MIL/uL (ref 3.80–5.20)
RDW: 16.3 % — ABNORMAL HIGH (ref 11.5–14.5)
WBC: 8.8 10*3/uL (ref 3.6–11.0)

## 2015-02-08 LAB — COMPREHENSIVE METABOLIC PANEL
ALT: 17 U/L (ref 14–54)
ANION GAP: 9 (ref 5–15)
AST: 22 U/L (ref 15–41)
Albumin: 3.9 g/dL (ref 3.5–5.0)
Alkaline Phosphatase: 55 U/L (ref 38–126)
BUN: 13 mg/dL (ref 6–20)
CO2: 25 mmol/L (ref 22–32)
CREATININE: 1.04 mg/dL — AB (ref 0.44–1.00)
Calcium: 10.1 mg/dL (ref 8.9–10.3)
Chloride: 104 mmol/L (ref 101–111)
GFR calc Af Amer: 56 mL/min — ABNORMAL LOW (ref >60)
GFR calc non Af Amer: 49 mL/min — ABNORMAL LOW (ref >60)
Glucose, Bld: 106 mg/dL — ABNORMAL HIGH (ref 65–99)
Potassium: 3.5 mmol/L (ref 3.5–5.1)
Sodium: 138 mmol/L (ref 135–145)
Total Bilirubin: 0.5 mg/dL (ref 0.3–1.2)
Total Protein: 7.9 g/dL (ref 6.5–8.1)

## 2015-02-08 NOTE — Progress Notes (Signed)
[name] @ [facility] [phone]  [phone]     Lauren Mccann OB: Jan 04, 1931  MR#: [id number]  [id number]  Patient Care Team: Glendon Axe, MD as PCP - General (Internal Medicine)  CHIEF COMPLAINT: Oncology history Chief Complaint  Patient presents with  . Diarrhea   Subjective: Chief Complaint/Diagnosis:   1.patient was admitted in the hospital  In August of 2014 with hemoglobin of 6 g.  Iron deficiency anemia.  Underwent colonoscopy which revealed a large mass in the cecum, villous adenoma upper endoscopy at that time was normal. 2.  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  Margins are clear Clinical stage pT3  pN1 MO (28 August, 2014) 3.  Patient was admitted in the hospital with high fever postoperative abscess on September 8 underwent CT-guided drainage 4.  Patient was readmitted in the hospital on September 17 with transient small bowel obstruction responded to conservative therapy. The patient then was sent to rehabilitation HPI:      Malignant neoplasm of breast ([employer])   03/11/2014 Initial Diagnosis Malignant neoplasm of breast [facility])    Oncology Flowsheet 07/03/2014 07/03/2014 07/04/2014 07/04/2014 07/05/2014 07/05/2014  ALPRAZolam (XANAX) PO 0.5 mg 0.5 mg 0.5 mg 0.5 mg 0.5 mg 0.5 mg  enoxaparin (LOVENOX) [place] 80 mg   70 mg   - -  ondansetron (ZOFRAN) IV -   - - - -    INTERVAL HISTORY: Patient has been in hospital follow-up for last several months.  Had he fracture which was internally fixed and patient was transferred to the rehabilitation department.  Patient is having frequent diarrhea.  At least 6-10 stools per day.  Poor appetite.  Losing weight.  Abdominal cramps.  Patient has a history of carcinoid syndrome and previously had  responded to Sandostatin LAR.  According to patient after each injection patient had relief for 3 weeks in control of diarrhea.  Patient is here for further follow-up and treatment consideration REVIEW OF SYSTEMS:    general status: Patient is feeling weak and tired.  No change in a performance status.  No chills.  No fever. HEENT:   No evidence of stomatitis Lungs: No cough or shortness of breath Cardiac: No chest pain or paroxysmal nocturnal dyspnea GI: Diarrhea Skin: No rash Lower extremity no swelling Neurological system: No tingling.  No numbness.  No other focal signs Musculoskeletal system no bony pains  As per HPI. Otherwise, a complete review of systems is negatve.  PAST MEDICAL HISTORY: No past medical history on file.  PAST SURGICAL HISTORY: Past Surgical History  Procedure Laterality Date  . Hip pinning,cannulated Left 07/03/2014    Procedure: CANNULATED HIP PINNING;  Surgeon: Claud Kelp, MD;  Location: [facility] ORS;  Service: Orthopedics;  Laterality: Left;    FAMILY HISTORY No family history on file.  ADVANCED DIRECTIVES:  No flowsheet data found.  HEALTH MAINTENANCE: Social History  Substance Use Topics  . Smoking status: Never Smoker   . Smokeless tobacco: None  . Alcohol Use: None      Allergies  Allergen Reactions  . Propoxyphene Shortness Of Breath    Other reaction(s): Other (See Comments) GI Upset  . Alendronate Sodium     Other reaction(s): Other (See Comments) GI Upset  . Atorvastatin     Other reaction(s): Other (See Comments) Myalgia  .  Benadryl [Diphenhydramine Hcl] Other (See Comments)    Restless legs  . Codeine     Other reaction(s): Other (See Comments) Chest Tightness, SOB, Tachycardia GI Upset  Pt states "makes heart flutter and trouble breathing"  . Cymbalta [Duloxetine Hcl]   . Levofloxacin Other (See Comments)    Altered mental status  . Prednisone     Other reaction(s): Other (See Comments) Chest Pain, SOB, "can't breathe  good" Unable to sleep    Current Outpatient Prescriptions  Medication Sig Dispense Refill  . acetaminophen (TYLENOL) 325 MG tablet Take by mouth. 1 to 2 tablets by mouth every 4 hours as needed for pain    . ALPRAZolam (XANAX) 1 MG tablet Take half tablet twice daily as needed.    . diltiazem (CARDIZEM) 30 MG tablet Take 30 mg by mouth 2 (two) times daily.    . diphenoxylate-atropine (LOMOTIL) 2.5-0.025 MG per tablet Take 1 tablet by mouth 3 (three) times daily. As needed for diarrhea    . esomeprazole (NEXIUM) 40 MG capsule Take 1 capsule by mouth every morning.    . ferrous sulfate 325 (65 FE) MG tablet Take 1 tablet (325 mg total) by mouth 3 (three) times daily with meals. 90 tablet 3  . ferrous sulfate 325 (65 FE) MG tablet Take by mouth.    . gabapentin (NEURONTIN) 100 MG capsule Take 2 capsules by mouth 3 (three) times daily.    Marland Kitchen levothyroxine (SYNTHROID, LEVOTHROID) 25 MCG tablet TAKE ONE TABLET DAILY ON EMPTY STOMACH WITH GLASS OF WATER AT LEAST 30-60 MINUTES BEFORE BREAKFAST    . ondansetron (ZOFRAN) 4 MG tablet Take 1 tablet by mouth. Every 4 to 6 hours as needed for nausea, vomiting    . rivaroxaban (XARELTO) 20 MG TABS tablet Take 1 tablet (20 mg total) by mouth daily. 30 tablet 1  . sertraline (ZOLOFT) 100 MG tablet Take 1 tablet by mouth daily.    . traMADol (ULTRAM) 50 MG tablet Take 1 tablet (50 mg total) by mouth every 4 (four) hours. As needed for pain 30 tablet 0  . Cyanocobalamin (RA VITAMIN B-12 TR) 1000 MCG TBCR Take by mouth.    Marland Kitchen tiZANidine (ZANAFLEX) 2 MG tablet      No current facility-administered medications for this visit.    Significant History/PMH:   Pulmonary Embolus:    anxiety:    Fibromyalgia:    allergies:    mastectomy: right   simple mastectomy left: 17-Dec-2007   Cholecystectomy:    Back surgery:    Hysterectomy:   Preventive Screening:  Has patient had any of the following test? Colonscopy  Mammography   Last Colonoscopy: Aug  2014   Last Mammography: bilateral mastectomy   Smoking History: Smoking History Never Smoked.  PFSH: Comments: No family history of colorectal cancer, breast cancer, or ovarian cancer.  Social History: negative alcohol, negative tobacco  Additional Past Medical and Surgical History: Cholecystectomy.  Hysterectomy.  Multiple abdominal surgery.   OBJECTIVE:  Filed Vitals:   02/08/15 1538  BP: 116/73  Pulse: 88  Temp: 97.9 F (36.6 C)  Resp: 18     Body mass index is 26.58 kg/(m^2).    ECOG FS:1 - Symptomatic but completely ambulatory  PHYSICAL EXAM: GENERAL:  Well developed, well nourished, sitting comfortably in the exam room in no acute distress. MENTAL STATUS:  Alert and oriented to person, place and time. s. ENT:  Oropharynx clear without lesion.  Tongue normal. Mucous membranes moist.  RESPIRATORY:  Clear to  auscultation without rales, wheezes or rhonchi. CARDIOVASCULAR:  Regular rate and rhythm without murmur, rub or gallop. BREAST:  Right breast without masses, skin changes or nipple discharge.  Left breast without masses, skin changes or nipple discharge. ABDOMEN:  Soft, non-tender, with active bowel sounds, and no hepatosplenomegaly.  No masses. BACK:  No CVA tenderness.  No tenderness on percussion of the back or rib cage. SKIN:  No rashes, ulcers or lesions. EXTREMITIES: No edema, no skin discoloration or tenderness.  No palpable cords. LYMPH NODES: No palpable cervical, supraclavicular, axillary or inguinal adenopathy  NEUROLOGICAL: Unremarkable. PSYCH:  Appropriate.   LAB RESULTS:  Lab data date February 08, 2015  Potassium is 3.6.  Serum creatinine 1.04.  Hemoglobin is 12.6.  WBC 8.8.  Platelet count is 194    STUDIES: No results found.  ASSESSMENT: Diarrhea secondary to carcinoid syndrome Proceed with Sandostatin LAR.  30 mg every 4 weeks apart if needed can be repeated in 3 weeks if diarrhea recurs with the 4 weeks. All the records from the  hospital is been reviewed. Anemia has resolved.   Duration of visit is 25 minutes and 50% of time was spent discussing need for continuing Sandostatin LAR.  No matching staging information was found for the patient.  Forest Gleason, MD   02/10/2015 2:46 PM

## 2015-02-10 ENCOUNTER — Encounter: Payer: Self-pay | Admitting: Oncology

## 2015-02-15 ENCOUNTER — Ambulatory Visit: Payer: Medicare Other

## 2015-02-16 ENCOUNTER — Inpatient Hospital Stay: Payer: Medicare Other

## 2015-02-16 DIAGNOSIS — D3A8 Other benign neuroendocrine tumors: Secondary | ICD-10-CM

## 2015-02-16 DIAGNOSIS — E340 Carcinoid syndrome: Secondary | ICD-10-CM | POA: Diagnosis not present

## 2015-02-16 MED ORDER — OCTREOTIDE ACETATE 30 MG IM KIT
30.0000 mg | PACK | Freq: Once | INTRAMUSCULAR | Status: AC
  Administered 2015-02-16: 30 mg via INTRAMUSCULAR
  Filled 2015-02-16: qty 1

## 2015-03-14 ENCOUNTER — Telehealth: Payer: Self-pay | Admitting: *Deleted

## 2015-03-14 NOTE — Telephone Encounter (Signed)
Cancelling appt this week due to weather, asking if she even needs tocome this month since she is having constipation. Please advise

## 2015-03-15 ENCOUNTER — Inpatient Hospital Stay: Payer: Medicare Other

## 2015-03-15 ENCOUNTER — Inpatient Hospital Stay: Payer: Medicare Other | Admitting: Oncology

## 2015-03-15 NOTE — Telephone Encounter (Signed)
Notified pt that per Dr Oliva Bustard she does not need to get sandostatin this month, she stated she will call back and make an appt for next month

## 2015-03-23 ENCOUNTER — Inpatient Hospital Stay: Payer: Medicare Other

## 2015-03-23 ENCOUNTER — Inpatient Hospital Stay: Payer: Medicare Other | Admitting: Oncology

## 2015-03-27 ENCOUNTER — Emergency Department
Admission: EM | Admit: 2015-03-27 | Discharge: 2015-03-27 | Disposition: A | Discharge status: Home / Self Care | Payer: Medicare Other | Attending: Emergency Medicine | Admitting: Emergency Medicine

## 2015-03-27 ENCOUNTER — Emergency Department: Payer: Medicare Other

## 2015-03-27 DIAGNOSIS — M542 Cervicalgia: Secondary | ICD-10-CM | POA: Diagnosis not present

## 2015-03-27 DIAGNOSIS — R519 Headache, unspecified: Secondary | ICD-10-CM

## 2015-03-27 DIAGNOSIS — I498 Other specified cardiac arrhythmias: Secondary | ICD-10-CM

## 2015-03-27 DIAGNOSIS — M546 Pain in thoracic spine: Secondary | ICD-10-CM | POA: Diagnosis not present

## 2015-03-27 DIAGNOSIS — R51 Headache: Secondary | ICD-10-CM | POA: Diagnosis not present

## 2015-03-27 DIAGNOSIS — I499 Cardiac arrhythmia, unspecified: Secondary | ICD-10-CM | POA: Insufficient documentation

## 2015-03-27 DIAGNOSIS — Z79899 Other long term (current) drug therapy: Secondary | ICD-10-CM | POA: Diagnosis not present

## 2015-03-27 DIAGNOSIS — I10 Essential (primary) hypertension: Secondary | ICD-10-CM | POA: Diagnosis not present

## 2015-03-27 HISTORY — DX: Disorder of thyroid, unspecified: E07.9

## 2015-03-27 HISTORY — DX: Depression, unspecified: F32.A

## 2015-03-27 HISTORY — DX: Anxiety disorder, unspecified: F41.9

## 2015-03-27 HISTORY — DX: Anemia, unspecified: D64.9

## 2015-03-27 HISTORY — DX: Gastro-esophageal reflux disease without esophagitis: K21.9

## 2015-03-27 HISTORY — DX: Unspecified atrial fibrillation: I48.91

## 2015-03-27 HISTORY — DX: Major depressive disorder, single episode, unspecified: F32.9

## 2015-03-27 LAB — BASIC METABOLIC PANEL
Anion gap: 9 (ref 5–15)
BUN: 16 mg/dL (ref 6–20)
CO2: 25 mmol/L (ref 22–32)
CREATININE: 0.88 mg/dL (ref 0.44–1.00)
Calcium: 9.5 mg/dL (ref 8.9–10.3)
Chloride: 106 mmol/L (ref 101–111)
GFR calc Af Amer: >60 mL/min (ref >60)
GFR, EST NON AFRICAN AMERICAN: 60 mL/min — AB (ref >60)
Glucose, Bld: 109 mg/dL — ABNORMAL HIGH (ref 65–99)
Potassium: 4.2 mmol/L (ref 3.5–5.1)
SODIUM: 140 mmol/L (ref 135–145)

## 2015-03-27 LAB — URINALYSIS COMPLETE WITH MICROSCOPIC (ARMC ONLY)
BACTERIA UA: NONE SEEN
Bilirubin Urine: NEGATIVE
GLUCOSE, UA: NEGATIVE mg/dL
HGB URINE DIPSTICK: NEGATIVE
Ketones, ur: NEGATIVE mg/dL
NITRITE: NEGATIVE
PROTEIN: NEGATIVE mg/dL
Specific Gravity, Urine: 1.009 (ref 1.005–1.030)
pH: 6 (ref 5.0–8.0)

## 2015-03-27 LAB — CBC
HCT: 39.3 % (ref 35.0–47.0)
Hemoglobin: 13.1 g/dL (ref 12.0–16.0)
MCH: 30.2 pg (ref 26.0–34.0)
MCHC: 33.3 g/dL (ref 32.0–36.0)
MCV: 90.7 fL (ref 80.0–100.0)
PLATELETS: 195 10*3/uL (ref 150–440)
RBC: 4.33 MIL/uL (ref 3.80–5.20)
RDW: 15.3 % — ABNORMAL HIGH (ref 11.5–14.5)
WBC: 9.4 10*3/uL (ref 3.6–11.0)

## 2015-03-27 LAB — PROTIME-INR
INR: 1.19
Prothrombin Time: 15.3 seconds — ABNORMAL HIGH (ref 11.4–15.0)

## 2015-03-27 LAB — APTT: APTT: 33 s (ref 24–36)

## 2015-03-27 LAB — SEDIMENTATION RATE: SED RATE: 35 mm/h — AB (ref 0–30)

## 2015-03-27 MED ORDER — SODIUM CHLORIDE 0.9 % IV BOLUS (SEPSIS)
1000.0000 mL | Freq: Once | INTRAVENOUS | Status: AC
  Administered 2015-03-27: 1000 mL via INTRAVENOUS

## 2015-03-27 MED ORDER — ACETAMINOPHEN 500 MG PO TABS
1000.0000 mg | ORAL_TABLET | Freq: Once | ORAL | Status: AC
  Administered 2015-03-27: 1000 mg via ORAL

## 2015-03-27 MED ORDER — ACETAMINOPHEN 500 MG PO TABS: 1000.0000 mg | ORAL_TABLET | Freq: Three times a day (TID) | ORAL | Status: DC | PRN

## 2015-03-27 MED ORDER — PROCHLORPERAZINE MALEATE 5 MG PO TABS: 5.0000 mg | ORAL_TABLET | Freq: Four times a day (QID) | ORAL | Status: DC | PRN

## 2015-03-27 MED ORDER — PROCHLORPERAZINE EDISYLATE 5 MG/ML IJ SOLN
5.0000 mg | Freq: Four times a day (QID) | INTRAMUSCULAR | Status: DC | PRN
  Filled 2015-03-27: qty 2

## 2015-03-27 NOTE — ED Notes (Signed)
Patient transported to CT 

## 2015-03-27 NOTE — ED Provider Notes (Signed)
[facility] Emergency Department Provider Note  ____________________________________________  Time seen: Approximately 12:13 PM  I have reviewed the triage vital signs and the nursing notes.   HISTORY  Chief Complaint Headache    HPI Terrilee L Wrobel is a 82 y.o. female with a history of fibromyalgia, A. fib and PE with IVC and on Xarelto, headaches, presenting with headache. Patient states that 2 weeks ago she developed a "clicking" sound in her right greater than left ear. 7 days ago she developed a frontal headache that radiated to the back of the head and down the neck into the upper back. She has not had any associated fever, chills, visual changes including blurred or double vision, lightheadedness or syncope, changes in speech, numbness tingling or weakness, difficulty walking, nausea or vomiting, chest pain or palpitations, or shortness of breath. No recent tick bites or trauma. Patient states that she often has daily headaches which resolves with Tylenol, but this headache has not improved with Tylenol. She has had no recent medication changes. She did have a recent life stressor with the death of her sister last week, but her symptoms started before that.   Past Medical History  Diagnosis Date  . Anxiety   . A-fib ([place])   . GERD (gastroesophageal reflux disease)   . Anemia   . Thyroid disease   . Depression     Patient Active Problem List   Diagnosis Date Noted  . Arthritis 02/08/2015  . Fibromyalgia 02/08/2015  . HLD (hyperlipidemia) 02/08/2015  . 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 Neuroendocrine tumor 02/08/2015  . OP (osteoporosis) 11/01/2014  . Anemia, iron deficiency 11/01/2014  . Closed fracture of neck of left femur with routine healing 08/07/2014  . Hip fracture requiring operative repair ([place]) 07/04/2014  . Anxiety 07/02/2014  . Arthritis of knee, degenerative 05/23/2014  . Acquired hypothyroidism 05/14/2014  . Benign essential HTN  05/14/2014  . Breath shortness 05/14/2014  . Malignant neoplasm of breast ([place]) 03/11/2014  . 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 Clinical depression 03/10/2014  . Pulmonary embolism ([place]) 03/10/2014    Past Surgical History  Procedure Laterality Date  . Hip pinning,cannulated Left 07/03/2014    Procedure: CANNULATED HIP PINNING;  Surgeon: Claud Kelp, MD;  Location: [facility] ORS;  Service: Orthopedics;  Laterality: Left;    Current Outpatient Rx  Name  Route  Sig  Dispense  Refill  . acetaminophen (TYLENOL) 500 MG tablet   Oral   Take 2 tablets (1,000 mg total) by mouth every 8 (eight) hours as needed for headache.   18 tablet   0   . ALPRAZolam (XANAX) 1 MG tablet      Take half tablet twice daily as needed.         . Cyanocobalamin (RA VITAMIN B-12 TR) 1000 MCG TBCR   Oral   Take by mouth.         . diltiazem (CARDIZEM) 30 MG tablet   Oral   Take 30 mg by mouth 2 (two) times daily.         . diphenoxylate-atropine (LOMOTIL) 2.5-0.025 MG per tablet   Oral   Take 1 tablet by mouth 3 (three) times daily. As needed for diarrhea         . esomeprazole (NEXIUM) 40 MG capsule   Oral   Take 1 capsule by mouth every morning.         . ferrous sulfate 325 (65 FE) MG tablet   Oral   Take  1 tablet (325 mg total) by mouth 3 (three) times daily with meals.   90 tablet   3   . ferrous sulfate 325 (65 FE) MG tablet   Oral   Take by mouth.         . gabapentin (NEURONTIN) 100 MG capsule   Oral   Take 2 capsules by mouth 3 (three) times daily.         Marland Kitchen levothyroxine (SYNTHROID, LEVOTHROID) 25 MCG tablet      TAKE ONE TABLET DAILY ON EMPTY STOMACH WITH GLASS OF WATER AT LEAST 30-60 MINUTES BEFORE BREAKFAST         . ondansetron (ZOFRAN) 4 MG tablet   Oral   Take 1 tablet by mouth. Every 4 to 6 hours as needed for nausea, vomiting         . prochlorperazine (COMPAZINE) 5 MG tablet   Oral   Take 1 tablet (5 mg total) by mouth every 6 (six) hours  as needed (headache).   10 tablet   0   . rivaroxaban (XARELTO) 20 MG TABS tablet   Oral   Take 1 tablet (20 mg total) by mouth daily.   30 tablet   1   . sertraline (ZOLOFT) 100 MG tablet   Oral   Take 1 tablet by mouth daily.         Marland Kitchen tiZANidine (ZANAFLEX) 2 MG tablet               . traMADol (ULTRAM) 50 MG tablet   Oral   Take 1 tablet (50 mg total) by mouth every 4 (four) hours. As needed for pain   30 tablet   0     Allergies Propoxyphene; Alendronate sodium; Atorvastatin; Benadryl; Codeine; Cymbalta; Levofloxacin; and Prednisone  No family history on file.  Social History Social History  Substance Use Topics  . Smoking status: Never Smoker   . Smokeless tobacco: None  . Alcohol Use: No    Review of Systems Constitutional: No fever/chills. No lightheadedness or syncope. No trauma. Eyes: No visual changes. No blurred or double vision. ENT: No sore throat. Positive "clicking" sounds bilaterally. Cardiovascular: Denies chest pain, palpitations. Respiratory: Denies shortness of breath.  No cough. Gastrointestinal: No abdominal pain.  No nausea, no vomiting.  No diarrhea.  No constipation. Genitourinary: Negative for dysuria. No urinary frequency. Musculoskeletal: Positive for upper back pain. Skin: Negative for rash. Neurological: Positive for headache. Negative for numbness weakness or tingling. Psychiatric:Baseline anxiety and depression which are not worse since the death of her sister last week.  10-point ROS otherwise negative.  ____________________________________________   PHYSICAL EXAM:  VITAL SIGNS: ED Triage Vitals  Enc Vitals Group     BP 03/27/15 1200 142/73 mmHg     Pulse Rate 03/27/15 1200 71     Resp 03/27/15 1200 16     Temp 03/27/15 1200 98.3 F (36.8 C)     Temp Source 03/27/15 1200 Oral     SpO2 03/27/15 1200 98 %     Weight 03/27/15 1200 143 lb (64.864 kg)     Height 03/27/15 1200 5\' 2"  (1.575 m)     Head Cir --       Peak Flow --      Pain Score 03/27/15 1201 8     Pain Loc --      Pain Edu? --      Excl. in [place]? --     Constitutional: Patient is alert and oriented and answering  questions appropriate. She is sitting comfortably in the bed.  Eyes: Conjunctivae are normal.  EOMI. PERRLA. Positive arcus senilis. Head: Atraumatic. No raccoon eyes or Battle sign. No significant pain over the temples bilaterally. Nose: No congestion/rhinnorhea. Mouth/Throat: Mucous membranes are moist.  Neck: No stridor.  Supple.  Full range of motion without significant pain. Diffuse tenderness in the mid and lower neck including but not limited to the midline without any obvious step-offs or deformities. Cardiovascular: Normal rate, irregular rhythm. No murmurs, rubs or gallops.  Respiratory: Normal respiratory effort.  No retractions. Lungs CTAB.  No wheezes, rales or ronchi. Gastrointestinal: Soft and nontender. No distention. No peritoneal signs. Musculoskeletal: No LE edema.  Neurologic: Alert and oriented 3. Speech is clear. Face and smile symmetric. EOMI. PERRLA. Tongue is midline. No pronator drift. 5 out of 5 grip, biceps, triceps, hip flexors, plantar flexion and dorsiflexion. Normal sensation to light touch in the bilateral upper and lower extremities, and face. Normal heel-to-shin. Normal gait without ataxia. Skin:  Skin is warm, dry and intact. No rash noted. Psychiatric: Normal mood with flat affect.Marland Kitchen Speech and behavior are normal.  Normal judgement.  ____________________________________________   LABS (all labs ordered are listed, but only abnormal results are displayed)  Labs Reviewed  CBC - Abnormal; Notable for the following:    RDW 15.3 (*)    All other components within normal limits  BASIC METABOLIC PANEL - Abnormal; Notable for the following:    Glucose, Bld 109 (*)    GFR calc non Af Amer 60 (*)    All other components within normal limits  PROTIME-INR - Abnormal; Notable for the following:     Prothrombin Time 15.3 (*)    All other components within normal limits  URINALYSIS COMPLETEWITH MICROSCOPIC ([facility] ONLY) - Abnormal; Notable for the following:    Color, Urine YELLOW (*)    APPearance CLEAR (*)    Leukocytes, UA 2+ (*)    Squamous Epithelial / LPF 0-5 (*)    All other components within normal limits  SEDIMENTATION RATE - Abnormal; Notable for the following:    Sed Rate 35 (*)    All other components within normal limits  APTT   ____________________________________________  EKG  ED ECG REPORT I, Eula Listen, the attending physician, personally viewed and interpreted this ECG.   Date: 03/27/2015  EKG Time: 1253  Rate: 90  Rhythm: sinus arrhythmia with occasional PVCs  Axis: Heike Pounds  Intervals:none  ST&T Change: No ST changes.  This EKG is grossly unchanged compared to 08/15 and is unlikely to be the reason for her headache. She is are he followed for this arrhythmia.  ____________________________________________  RADIOLOGY  Ct Head Wo Contrast  03/27/2015  CLINICAL DATA:  Headache, radiating into neck for past 7 days. EXAM: CT HEAD WITHOUT CONTRAST CT CERVICAL SPINE WITHOUT CONTRAST TECHNIQUE: Multidetector CT imaging of the head and cervical spine was performed following the standard protocol without intravenous contrast. Multiplanar CT image reconstructions of the cervical spine were also generated. COMPARISON:  07/02/2014 FINDINGS: CT HEAD FINDINGS There is atrophy and chronic small vessel disease changes. No acute intracranial abnormality. Specifically, no hemorrhage, hydrocephalus, mass lesion, acute infarction, or significant intracranial injury. No acute calvarial abnormality. Visualized paranasal sinuses and mastoids clear. Orbital soft tissues unremarkable. CT CERVICAL SPINE FINDINGS Loss of cervical lordosis. Diffuse degenerative disc and facet disease. Slight anterolisthesis of C3 on C4 and C4 on C5. Prevertebral soft tissues are normal. No  fracture. Moderate to severe multi level bilateral  neural foraminal narrowing related to facet disease and uncovertebral spurring. No epidural or paraspinal hematoma. IMPRESSION: No acute intracranial abnormality.Atrophy, chronic microvascular disease. Degenerative disc and facet disease throughout the cervical spine. Loss of cervical lordosis. No acute bony abnormality. Electronically Signed   By: Rolm Baptise M.D.   On: 03/27/2015 12:33   Ct Cervical Spine Wo Contrast  03/27/2015  CLINICAL DATA:  Headache, radiating into neck for past 7 days. EXAM: CT HEAD WITHOUT CONTRAST CT CERVICAL SPINE WITHOUT CONTRAST TECHNIQUE: Multidetector CT imaging of the head and cervical spine was performed following the standard protocol without intravenous contrast. Multiplanar CT image reconstructions of the cervical spine were also generated. COMPARISON:  07/02/2014 FINDINGS: CT HEAD FINDINGS There is atrophy and chronic small vessel disease changes. No acute intracranial abnormality. Specifically, no hemorrhage, hydrocephalus, mass lesion, acute infarction, or significant intracranial injury. No acute calvarial abnormality. Visualized paranasal sinuses and mastoids clear. Orbital soft tissues unremarkable. CT CERVICAL SPINE FINDINGS Loss of cervical lordosis. Diffuse degenerative disc and facet disease. Slight anterolisthesis of C3 on C4 and C4 on C5. Prevertebral soft tissues are normal. No fracture. Moderate to severe multi level bilateral neural foraminal narrowing related to facet disease and uncovertebral spurring. No epidural or paraspinal hematoma. IMPRESSION: No acute intracranial abnormality.Atrophy, chronic microvascular disease. Degenerative disc and facet disease throughout the cervical spine. Loss of cervical lordosis. No acute bony abnormality. Electronically Signed   By: Rolm Baptise M.D.   On: 03/27/2015 12:33    ____________________________________________   PROCEDURES  Procedure(s) performed:  None  Critical Care performed: No ____________________________________________   INITIAL IMPRESSION / ASSESSMENT AND PLAN / ED COURSE  Pertinent labs & imaging results that were available during my care of the patient were reviewed by me and considered in my medical decision making (see chart for details).  82 y.o. female with a history of headaches, onset around 2, presenting with headache 7 days, preceded by "clicking" sounds 7 days. On exam, the patient is afebrile and nontoxic.  She is not meningitic. There is no recent history of trauma. I'll get a CT to evaluate for any intracranial abnormalities including mass or bleed, but there are no focal neurologic findings on my exam. I will get a sedimentation rate given her age for risk stratification of temporal arteritis, but without any pain over the temples or visual changes, this is also less likely. I'll give her fluids and symptomatic treatment to see if I can improve her discomfort. Plan reevaluation for final disposition.  ----------------------------------------- 2:24 PM on 03/27/2015 -----------------------------------------  The patient states that her headache pain has been cut in half, and she is tolerating liquid by mouth. Her labs are reassuring, and her CT scan does not show any acute intracranial process. On my repeat examination, she appears more comfortable and as before, does not show any signs of meningismus. I will plan to discharge the patient home and have her follow-up with her primary care physician for reevaluation tomorrow. She understands return precautions as well as follow-up instructions.  ____________________________________________  FINAL CLINICAL IMPRESSION(S) / ED DIAGNOSES  Final diagnoses:  Acute nonintractable headache, unspecified headache type  Neck pain      NEW MEDICATIONS STARTED DURING THIS VISIT:  New Prescriptions   ACETAMINOPHEN (TYLENOL) 500 MG TABLET    Take 2 tablets (1,000 mg  total) by mouth every 8 (eight) hours as needed for headache.   PROCHLORPERAZINE (COMPAZINE) 5 MG TABLET    Take 1 tablet (5 mg total) by mouth every 6 (  six) hours as needed (headache).     Eula Listen, MD 03/27/15 1428

## 2015-03-27 NOTE — Discharge Instructions (Signed)
Please drink plenty of fluid to stay well hydrated. Please eat small regular meals throughout the day.  You may take Tylenol for headache, but do not take more than prescribed. Compazine can also be used for nausea or headache.  Return to the emergency department if you develop worsening headache, visual changes, numbness tingling or weakness, fever, rash, or any other symptoms concerning to you.

## 2015-03-27 NOTE — ED Notes (Signed)
Pt comes into the ED via EMS from home with c/o HA that radiates into the next for the past 7 days.. States she lost her sister last week.. Pt states she takes xeralto.

## 2015-04-04 ENCOUNTER — Telehealth: Payer: Self-pay | Admitting: *Deleted

## 2015-04-04 NOTE — Telephone Encounter (Signed)
Okay to cancel appt tomorrow. Pt needs to callback to reschedule appt.

## 2015-04-04 NOTE — Telephone Encounter (Signed)
Called patient and left message that it is okay to cancel appointment for tomorrow.  She can call back to reschedule.

## 2015-04-04 NOTE — Telephone Encounter (Signed)
Patient has an appointment tomorrow for MD/Sandostatin.  States she is constipated and does not feel she needs to come.  Please advise. States she will call back for future appointment.

## 2015-04-06 ENCOUNTER — Inpatient Hospital Stay: Payer: Medicare Other

## 2015-04-06 ENCOUNTER — Inpatient Hospital Stay: Payer: Medicare Other | Admitting: Oncology

## 2015-07-12 DIAGNOSIS — E7800 Pure hypercholesterolemia, unspecified: Secondary | ICD-10-CM | POA: Insufficient documentation

## 2015-07-18 ENCOUNTER — Other Ambulatory Visit: Payer: Self-pay | Admitting: Neurology

## 2015-07-18 DIAGNOSIS — G44209 Tension-type headache, unspecified, not intractable: Secondary | ICD-10-CM

## 2015-07-18 DIAGNOSIS — R42 Dizziness and giddiness: Secondary | ICD-10-CM

## 2015-08-07 ENCOUNTER — Ambulatory Visit: Payer: Medicare Other

## 2015-08-23 ENCOUNTER — Ambulatory Visit (HOSPITAL_COMMUNITY): Payer: Medicare Other

## 2015-09-08 ENCOUNTER — Ambulatory Visit: Admission: RE | Admit: 2015-09-08 | Payer: Medicare Other | Source: Ambulatory Visit

## 2015-09-19 ENCOUNTER — Other Ambulatory Visit: Payer: Self-pay | Admitting: Neurology

## 2015-09-19 DIAGNOSIS — G44201 Tension-type headache, unspecified, intractable: Secondary | ICD-10-CM

## 2015-09-27 ENCOUNTER — Ambulatory Visit
Admission: RE | Admit: 2015-09-27 | Discharge: 2015-09-27 | Disposition: A | Discharge status: Home / Self Care | Payer: Medicare Other | Source: Ambulatory Visit | Attending: Neurology | Admitting: Neurology

## 2015-09-27 DIAGNOSIS — M5481 Occipital neuralgia: Secondary | ICD-10-CM | POA: Diagnosis present

## 2015-09-27 DIAGNOSIS — G44201 Tension-type headache, unspecified, intractable: Secondary | ICD-10-CM | POA: Diagnosis present

## 2015-09-27 DIAGNOSIS — I672 Cerebral atherosclerosis: Secondary | ICD-10-CM | POA: Insufficient documentation

## 2015-09-27 DIAGNOSIS — I739 Peripheral vascular disease, unspecified: Secondary | ICD-10-CM | POA: Diagnosis not present

## 2015-09-27 DIAGNOSIS — G311 Senile degeneration of brain, not elsewhere classified: Secondary | ICD-10-CM | POA: Diagnosis not present

## 2015-09-28 ENCOUNTER — Emergency Department
Admission: EM | Admit: 2015-09-28 | Discharge: 2015-09-28 | Disposition: A | Discharge status: Home / Self Care | Payer: Medicare Other | Attending: Emergency Medicine | Admitting: Emergency Medicine

## 2015-09-28 ENCOUNTER — Encounter: Payer: Self-pay | Admitting: Emergency Medicine

## 2015-09-28 DIAGNOSIS — F419 Anxiety disorder, unspecified: Secondary | ICD-10-CM | POA: Insufficient documentation

## 2015-09-28 DIAGNOSIS — Z5321 Procedure and treatment not carried out due to patient leaving prior to being seen by health care provider: Secondary | ICD-10-CM | POA: Diagnosis not present

## 2015-09-28 DIAGNOSIS — G43909 Migraine, unspecified, not intractable, without status migrainosus: Secondary | ICD-10-CM | POA: Insufficient documentation

## 2015-09-28 DIAGNOSIS — Z7901 Long term (current) use of anticoagulants: Secondary | ICD-10-CM | POA: Diagnosis not present

## 2015-09-28 NOTE — ED Triage Notes (Signed)
Patient presents to the ED via EMS from home.  Patient is complaining of severe headache.  Patient reports history of similar headache x 7 months.  Patient is upset in triage stating, "you people don't care about me.  Nobody believes me. Nobody understands."  Patient has been seen by PCP and by neurology for headache.  Patient had a CT scan yesterday.  Patient states, "nothing's done any good that they've given me."  Patient is holding her head in triage.  Denies blurred vision and denies photophobia.

## 2015-10-17 DIAGNOSIS — R32 Unspecified urinary incontinence: Secondary | ICD-10-CM | POA: Insufficient documentation

## 2015-10-17 DIAGNOSIS — L299 Pruritus, unspecified: Secondary | ICD-10-CM | POA: Insufficient documentation

## 2015-11-07 ENCOUNTER — Other Ambulatory Visit: Payer: Self-pay | Admitting: Neurology

## 2015-11-07 DIAGNOSIS — R519 Headache, unspecified: Secondary | ICD-10-CM

## 2015-11-07 DIAGNOSIS — R51 Headache: Principal | ICD-10-CM

## 2015-11-17 ENCOUNTER — Ambulatory Visit: Admission: RE | Admit: 2015-11-17 | Payer: Medicare Other | Source: Ambulatory Visit

## 2015-11-28 ENCOUNTER — Telehealth: Payer: Self-pay | Admitting: *Deleted

## 2015-11-28 ENCOUNTER — Encounter: Payer: Self-pay | Admitting: Pain Medicine

## 2015-11-28 ENCOUNTER — Ambulatory Visit: Payer: Medicare Other | Attending: Pain Medicine | Admitting: Pain Medicine

## 2015-11-28 VITALS — BP 133/69 | HR 73 | Temp 98.4°F | Resp 16 | Ht 62.0 in | Wt 133.0 lb

## 2015-11-28 DIAGNOSIS — D509 Iron deficiency anemia, unspecified: Secondary | ICD-10-CM | POA: Insufficient documentation

## 2015-11-28 DIAGNOSIS — M25561 Pain in right knee: Secondary | ICD-10-CM | POA: Insufficient documentation

## 2015-11-28 DIAGNOSIS — I10 Essential (primary) hypertension: Secondary | ICD-10-CM | POA: Diagnosis not present

## 2015-11-28 DIAGNOSIS — F419 Anxiety disorder, unspecified: Secondary | ICD-10-CM | POA: Diagnosis not present

## 2015-11-28 DIAGNOSIS — Z7901 Long term (current) use of anticoagulants: Secondary | ICD-10-CM

## 2015-11-28 DIAGNOSIS — M5030 Other cervical disc degeneration, unspecified cervical region: Secondary | ICD-10-CM | POA: Insufficient documentation

## 2015-11-28 DIAGNOSIS — G8929 Other chronic pain: Secondary | ICD-10-CM | POA: Diagnosis not present

## 2015-11-28 DIAGNOSIS — K529 Noninfective gastroenteritis and colitis, unspecified: Secondary | ICD-10-CM | POA: Diagnosis not present

## 2015-11-28 DIAGNOSIS — M810 Age-related osteoporosis without current pathological fracture: Secondary | ICD-10-CM | POA: Diagnosis not present

## 2015-11-28 DIAGNOSIS — M539 Dorsopathy, unspecified: Secondary | ICD-10-CM

## 2015-11-28 DIAGNOSIS — Z5181 Encounter for therapeutic drug level monitoring: Secondary | ICD-10-CM

## 2015-11-28 DIAGNOSIS — E039 Hypothyroidism, unspecified: Secondary | ICD-10-CM | POA: Insufficient documentation

## 2015-11-28 DIAGNOSIS — Z79899 Other long term (current) drug therapy: Secondary | ICD-10-CM | POA: Diagnosis not present

## 2015-11-28 DIAGNOSIS — M4726 Other spondylosis with radiculopathy, lumbar region: Secondary | ICD-10-CM | POA: Insufficient documentation

## 2015-11-28 DIAGNOSIS — Z86711 Personal history of pulmonary embolism: Secondary | ICD-10-CM | POA: Insufficient documentation

## 2015-11-28 DIAGNOSIS — R51 Headache: Secondary | ICD-10-CM | POA: Diagnosis present

## 2015-11-28 DIAGNOSIS — M545 Low back pain, unspecified: Secondary | ICD-10-CM | POA: Insufficient documentation

## 2015-11-28 DIAGNOSIS — Z853 Personal history of malignant neoplasm of breast: Secondary | ICD-10-CM

## 2015-11-28 DIAGNOSIS — R109 Unspecified abdominal pain: Secondary | ICD-10-CM | POA: Insufficient documentation

## 2015-11-28 DIAGNOSIS — M546 Pain in thoracic spine: Secondary | ICD-10-CM | POA: Diagnosis not present

## 2015-11-28 DIAGNOSIS — R519 Headache, unspecified: Secondary | ICD-10-CM | POA: Insufficient documentation

## 2015-11-28 DIAGNOSIS — E785 Hyperlipidemia, unspecified: Secondary | ICD-10-CM | POA: Diagnosis not present

## 2015-11-28 DIAGNOSIS — F1190 Opioid use, unspecified, uncomplicated: Secondary | ICD-10-CM | POA: Diagnosis not present

## 2015-11-28 DIAGNOSIS — R32 Unspecified urinary incontinence: Secondary | ICD-10-CM | POA: Diagnosis not present

## 2015-11-28 DIAGNOSIS — M5481 Occipital neuralgia: Secondary | ICD-10-CM | POA: Diagnosis not present

## 2015-11-28 DIAGNOSIS — F329 Major depressive disorder, single episode, unspecified: Secondary | ICD-10-CM | POA: Insufficient documentation

## 2015-11-28 DIAGNOSIS — M961 Postlaminectomy syndrome, not elsewhere classified: Secondary | ICD-10-CM

## 2015-11-28 DIAGNOSIS — E7800 Pure hypercholesterolemia, unspecified: Secondary | ICD-10-CM | POA: Diagnosis not present

## 2015-11-28 DIAGNOSIS — K219 Gastro-esophageal reflux disease without esophagitis: Secondary | ICD-10-CM | POA: Insufficient documentation

## 2015-11-28 DIAGNOSIS — M542 Cervicalgia: Secondary | ICD-10-CM

## 2015-11-28 DIAGNOSIS — M25562 Pain in left knee: Secondary | ICD-10-CM | POA: Insufficient documentation

## 2015-11-28 DIAGNOSIS — Z9889 Other specified postprocedural states: Secondary | ICD-10-CM | POA: Insufficient documentation

## 2015-11-28 DIAGNOSIS — M170 Bilateral primary osteoarthritis of knee: Secondary | ICD-10-CM | POA: Diagnosis not present

## 2015-11-28 DIAGNOSIS — Z0189 Encounter for other specified special examinations: Secondary | ICD-10-CM | POA: Insufficient documentation

## 2015-11-28 DIAGNOSIS — I4891 Unspecified atrial fibrillation: Secondary | ICD-10-CM | POA: Insufficient documentation

## 2015-11-28 DIAGNOSIS — Z79891 Long term (current) use of opiate analgesic: Secondary | ICD-10-CM | POA: Insufficient documentation

## 2015-11-28 DIAGNOSIS — M797 Fibromyalgia: Secondary | ICD-10-CM

## 2015-11-28 DIAGNOSIS — K589 Irritable bowel syndrome without diarrhea: Secondary | ICD-10-CM | POA: Insufficient documentation

## 2015-11-28 DIAGNOSIS — R1031 Right lower quadrant pain: Secondary | ICD-10-CM

## 2015-11-28 DIAGNOSIS — M549 Dorsalgia, unspecified: Secondary | ICD-10-CM

## 2015-11-28 NOTE — Progress Notes (Signed)
Safety precautions to be maintained throughout the outpatient stay will include: orient to surroundings, keep bed in low position, maintain call bell within reach at all times, provide assistance with transfer out of bed and ambulation.  

## 2015-11-28 NOTE — Telephone Encounter (Signed)
Spoke with Dr Glendon Axe office to inquire re; stopping medication prior to procedure,  Stop Xarelto 3 days prior to procedure and restart xarelto 6 hours post procedure.  They will call us back re; decision on doing this prior to LESI

## 2015-11-28 NOTE — Progress Notes (Signed)
Patient's Name: Lauren Mccann  MRN: [id number]  Referring Provider: Vladimir Crofts, MD  DOB: September 13, 1930  PCP: Glendon Axe, MD  DOS: 11/28/2015  Note by: Kathlen Brunswick. Dossie Arbour, MD  Service setting: Ambulatory outpatient  Specialty: Interventional Pain Management  Location: [facility] (AMB) Pain Management Facility    Patient type: New patient   Primary Reason(s) for Visit: Initial Patient Evaluation CC: Headache (right)  HPI  Lauren Mccann is a 82 y.o. year old, female patient, who comes today for an initial evaluation. She has Acquired hypothyroidism; Benign essential HTN; Malignant neoplasm of breast ([place]); Fibromyalgia; Adaptive colitis; Osteoporosis, post-menopausal; Anemia, iron deficiency; Osteoarthritis of knee (Bilateral) (R>L); Pulmonary embolism ([place]); Neuroendocrine tumor; Anxiety and depression; Depression; Hyperlipidemia; Itching; Pure hypercholesterolemia; Urinary incontinence in female; Chronic pain; Long term current use of opiate analgesic; Long term prescription opiate use; Opiate use (15 MME/Day); Long term prescription benzodiazepine use; Unilateral occipital headache (Right); Multilevel spine pain (Location of Tertiary source of pain) (lumbar > thoracic); Encounter for therapeutic drug level monitoring; Encounter for pain management planning; Chronic neck pain (Location of Secondary source of pain) (Bilateral) (R>L); Cervical spine pain; Thoracic spine pain; Lumbar spine pain; Occipital neuralgia (Location of Primary Source of Pain) (Bilateral) (R>L); Chronic knee pain (Bilateral) (R>L); Chronic abdominal pain (RLQ); Failed back surgical syndrome; Chronic anticoagulation with Xarelto (stroke/pulmonary embolism); and History of breast cancer on her problem list.. Her primarily concern today is the Headache (right)  Pain Assessment: Self-Reported Pain Score: 4 /10             Reported level is compatible with observation.       Pain Type: Chronic pain Pain Location: Head Pain  Orientation: Right Pain Descriptors / Indicators:  ("pins sticking in my head") Pain Frequency: Constant  Onset and Duration: Sudden, Date of onset: 8 months ago, Date of injury: January 2017 and Present longer than 3 months Cause of pain: Unknown Severity: Getting worse, NAS-11 at its worse: 10/10, NAS-11 at its best: 8/10 and NAS-11 on the average: 8/10 Timing: Not influenced by the time of the day Aggravating Factors: Bending and Motion Alleviating Factors: The patient denies any alleviating factors such as stretching, lying down, taking medications, nerve blocks, resting, sitting, sleeping using a brace, or anything else. Associated Problems: Fatigue, Inability to control bladder (urine), Inability to control bowel and Pain that wakes patient up Quality of Pain: Aching, Exhausting, Itching, Pulsating, Tender, Throbbing, Tingling and Toothache-like Previous Examinations or Tests: CT scan and X-rays Previous Treatments: Narcotic medications  The patient comes into the clinics today for the first time for a chronic pain management evaluation. She indicates that the primary's source of pain is that of the headaches on the right side of her head over what appears to be the distribution of the greater occipital nerve. Following this is the neck pain on the right side and then the pain over the spine on all the way down to the sacral area. She is a very poor historian and she is also very inpatient.  Today I took the time to provide the patient with information regarding my pain practice. The patient was informed that my practice is divided into two sections: an interventional pain management section, as well as a completely separate and distinct medication management section. The interventional portion of my practice takes place on Tuesdays and Thursdays, while the medication management is conducted on Mondays and Wednesdays. Because of the amount of documentation required on both them, they are kept  separated. This means  that there is the possibility that the patient may be scheduled for a procedure on Tuesday, while also having a medication management appointment on Wednesday. I have also informed the patient that because of current staffing and facility limitations, I no longer take patients for medication management only. To illustrate the reasons for this, I gave the patient the example of a surgeon and how inappropriate it would be to refer a patient to his/her practice so that they write for the post-procedure antibiotics on a surgery done by someone else.   The patient was informed that joining my practice means that they are open to any and all interventional therapies. I clarified for the patient that this does not mean that they will be forced to have any procedures done. What it means is that patients looking for a practitioner to simply write for their pain medications and not take advantage of other interventional techniques will be better served by a different practitioner, other than myself. I made it clear that I prefer to spend my time providing those services that I specialize in.  The patient was also made aware of my Comprehensive Pain Management Safety Guidelines where by joining my practice, they limit all of their nerve blocks and joint injections to those done by our practice, for as long as we are retained to manage their care.   Historic Controlled Substance Pharmacotherapy Review  Currently Prescribed Analgesic: Tramadol 50 mg 1 tablet by mouth 3 times a day (150 mg/day) Medications: The patient did not bring the medication(s) to the appointment, as requested in our "New Patient Package" MME/day: 15 mg/day Pharmacodynamics: Analgesic Effect: More than 50% Activity Facilitation: Medication(s) allow patient to sit, stand, walk, and do the basic ADLs Perceived Effectiveness: Described as relatively effective, allowing for increase in activities of daily living  (ADL) Side-effects or Adverse reactions: None reported Historical Background Evaluation: [place] PDMP: Five (5) year initial data search conducted. No abnormal patterns identified [place] [employer] Offender Public Information: Non-contributory UDS Results: No UDS results available at this time UDS Interpretation: N/A Medication Assessment Form: Not applicable. Initial evaluation. The patient has not received any medications from our practice Treatment compliance: Not applicable. Initial evaluation Risk Assessment: Aberrant Behavior: None observed or detected today Opioid Fatal Overdose Risk Factors: None identified today Non-fatal overdose hazard ratio (HR): Calculation deferred Fatal overdose hazard ratio (HR): Calculation deferred Substance Use Disorder (SUD) Risk Level: Pending results of Medical Psychology Evaluation for SUD Opioid Risk Tool (ORT) Score: Total Score: 1 Low Risk for SUD (Score <3) Depression Scale Score: PHQ-2: PHQ-2 Total Score: 0 No depression (0) PHQ-9: PHQ-9 Total Score: 0 No depression (0-4)  Pharmacologic Plan: Pending ordered tests and/or consults  Historical Illicit Drug Screen Labs(s): No results found for: MDMA, COCAINSCRNUR, PCPSCRNUR, THCU, ETH  Meds  The patient has a current medication list which includes the following prescription(s): acetaminophen, alprazolam, baclofen, diltiazem, gabapentin, levocetirizine, levothyroxine, lidocaine, loperamide, ondansetron, oxybutynin, prochlorperazine, rivaroxaban, sertraline, and tizanidine.  Current Outpatient Prescriptions on File Prior to Visit  Medication Sig  . acetaminophen (TYLENOL) 500 MG tablet Take 2 tablets (1,000 mg total) by mouth every 8 (eight) hours as needed for headache.  . ALPRAZolam (XANAX) 1 MG tablet Take half tablet twice daily as needed.  . gabapentin (NEURONTIN) 100 MG capsule Take 2 capsules by mouth daily.   Marland Kitchen levothyroxine (SYNTHROID, LEVOTHROID) 25 MCG tablet TAKE ONE TABLET  DAILY ON EMPTY STOMACH WITH GLASS OF WATER AT LEAST 30-60 MINUTES BEFORE BREAKFAST  .  ondansetron (ZOFRAN) 4 MG tablet Take 1 tablet by mouth. Every 4 to 6 hours as needed for nausea, vomiting  . prochlorperazine (COMPAZINE) 5 MG tablet Take 1 tablet (5 mg total) by mouth every 6 (six) hours as needed (headache).  . rivaroxaban (XARELTO) 20 MG TABS tablet Take 1 tablet (20 mg total) by mouth daily.  . sertraline (ZOLOFT) 100 MG tablet Take 1 tablet by mouth daily.  Marland Kitchen tiZANidine (ZANAFLEX) 2 MG tablet    No current facility-administered medications on file prior to visit.     Imaging Review  Cervical Imaging: Cervical CT wo contrast:  Results for orders placed during the hospital encounter of 03/27/15  CT Cervical Spine Wo Contrast   Narrative CLINICAL DATA:  Headache, radiating into neck for past 7 days.  EXAM: CT HEAD WITHOUT CONTRAST  CT CERVICAL SPINE WITHOUT CONTRAST  TECHNIQUE: Multidetector CT imaging of the head and cervical spine was performed following the standard protocol without intravenous contrast. Multiplanar CT image reconstructions of the cervical spine were also generated.  COMPARISON:  07/02/2014  FINDINGS: CT HEAD FINDINGS  There is atrophy and chronic small vessel disease changes. No acute intracranial abnormality. Specifically, no hemorrhage, hydrocephalus, mass lesion, acute infarction, or significant intracranial injury. No acute calvarial abnormality. Visualized paranasal sinuses and mastoids clear. Orbital soft tissues unremarkable.  CT CERVICAL SPINE FINDINGS  Loss of cervical lordosis. Diffuse degenerative disc and facet disease. Slight anterolisthesis of C3 on C4 and C4 on C5. Prevertebral soft tissues are normal. No fracture. Moderate to severe multi level bilateral neural foraminal narrowing related to facet disease and uncovertebral spurring. No epidural or paraspinal hematoma.  IMPRESSION: No acute intracranial abnormality.Atrophy,  chronic microvascular disease.  Degenerative disc and facet disease throughout the cervical spine. Loss of cervical lordosis. No acute bony abnormality.   Electronically Signed   By: Rolm Baptise M.D.   On: 03/27/2015 12:33    Lumbosacral Imaging: Lumbar MR wo contrast:  Results for orders placed in visit on 07/11/10  MR L Spine Ltd W/O Cm   Narrative * PRIOR REPORT IMPORTED FROM AN EXTERNAL SYSTEM *   PRIOR REPORT IMPORTED FROM THE SYNGO [place] EXAM:    low back pain w radiculopathy  COMMENTS:   PROCEDURE:     MMR - MMR LUMBAR SPINE WO CONTRAST  - Jul 11 2010  8:47AM   RESULT:     Sagittal and axial T1 and effectively T2 weighted images were  obtained through the lumbar spine.   The lumbar vertebral bodies are preserved in height. There is curvature of  the lumbar spine with the convexity toward the left. The marrow signal of  the bony structures is normal. There is disc space narrowing at the L3-L4  level. Bulging disc material posteriorly is seen at L2-L3 and to a lesser  extent at L4-L5. The conus medullaris terminates posterior to the body of  L1. Axial imaging was performed from the inferior aspect of T12 to the  upper  aspect of S1.   At T12-L1 the AP dimension of the CSF filled thecal sac is 15 millimeters  and the neural foramina are widely patent. 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   At L1-L2 there is mild annular disc bulging. In the midline the AP  dimension  of the thecal sac is 15 mm. Mild encroachment upon the left neural foramen  is seen due to a focal bulge laterally.  The right neural foramen is  patent.   At L2-L3 there is straightening of the anterior border of the thecal sac.  In  the midline the AP dimension of the thecal sac is proximal 12 mm. There is  very mild encroachment upon the neural foramina bilaterally.   At L3-L4 there is annular disc bulging. In the midline the AP dimension of  the  thecal sac is 11 millimeters. There is mild encroachment upon the  neural  foramina bilaterally due to the annular bulge as well as due to facet  joint  hypertrophy.   At L4-L5 there is left lateral broadly based disc protrusion. This  produces  moderate to severe encroachment upon the left neural foramen. There is  mild  encroachment upon the right neural foramen due to mild annular disc bulge.  In the midline the AP dimension of the thecal sac is 13 mm, but this  tapers  rapidly as one moves toward the left.   At L5-S1 the AP dimension of the thecal sac measures 11 mm. There is a  broadly based central HNP superimposed upon an annular bulge. There is  mild  encroachment upon the right neural foramen and mild to moderate  encroachment  upon the left neural foramen.   IMPRESSION:  1. There are degenerative changes at multiple disc levels as described  above. The minimal AP dimension of the thecal sac is seen at L3-L4 where  it  measures 11 mm. There is annular disc bulging and facet joint hypertrophy.  Considerable loss of the disc space height at this level is seen.  2. At the other lumbar levels described above variable degrees of annular  disc bulging and broadly based disc bulging is seen with variable degrees  of  neural foraminal encroachment.  3. There is no evidence of a compression fracture.       Lumbar MR w/wo contrast:  Results for orders placed in visit on 10/17/11  MR Lumbar Spine W Wo Contrast   Narrative * PRIOR REPORT IMPORTED FROM AN EXTERNAL SYSTEM *   PRIOR REPORT IMPORTED FROM THE SYNGO [place] EXAM:    low back pain bilateral leg pain  COMMENTS:   PROCEDURE:     MR  - MR LUMBAR SPINE WO/W  - Oct 17 2011 11:12AM   RESULT:     MRI LUMBAR SPINE WITHOUT AND WITH CONTRAST   HISTORY: Low back pain   COMPARISON: 07/11/2010   TECHNIQUE: Standard MRI sequences of the lumbar spine were obtained both  pre- and post-administration of  14 ml of intravenous Magnevist.   FINDINGS:   The vertebral bodies of the lumbar spine are normal in size and alignment.  There is normal bone marrow signal demonstrated throughout the vertebra.  There is degenerative disc disease at L3-L4.   The spinal cord is of normal volume and contour. The cord terminates  normally at L1 . The nerve roots of the cauda equina and the filum  terminale  have the usual appearance.   The visualized portions of the SI joints are unremarkable.   The imaged intra-abdominal contents are unremarkable.   T12-L1: No significant disc bulge. No evidence of neural foraminal or  central stenosis.   L1-L2: Mild right paracentral disc bulge. No evidence of neural foraminal  or  central stenosis.   L2-L3: Mild broad-based disc bulge. Mild bilateral facet arthropathy. No  evidence of neural foraminal or central stenosis.   L3-L4: Mild broad-based  disc bulge. Moderate bilateral facet arthropathy.  Mild spinal stenosis. Mild bilateral foraminal stenosis.   L4-L5: Mild broad-based disc bulge with a left far lateral disc component.  Moderate-severe bilateral facet arthropathy, right greater than left with  ligamentum flavum infolding. There is right lateral recess stenosis. There  is mild spinal stenosis. There is moderate left foraminal stenosis.   L5-S1: No significant disc bulge. No evidence of neural foraminal or  central  stenosis. Moderate bilateral facet arthropathy.   IMPRESSION:   1. Lumbar spine spondylosis as described above.   Dictation Site: 1       Lumbar CT wo contrast:  Results for orders placed during the hospital encounter of 11/16/14  CT Lumbar Spine Wo Contrast   Narrative CLINICAL DATA:  Patient fell May 2016 fracturing LEFT hip. Complains of BILATERAL knee pain LEFT groin pain, as well as T and L-spine pain. History of breast cancer.  EXAM: CT LUMBAR SPINE WITHOUT CONTRAST  TECHNIQUE: Multidetector CT imaging of the lumbar  spine was performed without intravenous contrast administration. Multiplanar CT image reconstructions were also generated.  COMPARISON:  None.  FINDINGS: Degenerative scoliosis convex LEFT mid lumbar region related to asymmetric loss of interspace height at L3-4 worse on the RIGHT. Osteopenia without areas of concern for focal marrow replacement or acute/subacute compression deformity. No paravertebral soft tissue abnormalities. Non aneurysmal atherosclerotic calcification of the aorta. Unremarkable appearing inferior vena cava filter. No adenopathy. BILATERAL SI joint vacuum phenomenon.  Mild bulge L1-2 is eccentric to the RIGHT without definite neural impingement.  Unremarkable appearing L2-3 interspace.  At L3-4, there is moderate multifactorial stenosis related to disc space narrowing, disc osteophyte complex, and posterior element hypertrophy. BILATERAL subarticular zone and foraminal narrowing could affect the L3 and L4 nerve roots.  At L4-5, there is a central and leftward protrusion extending into the foramen.  At L5-S1, there is a shallow central protrusion. BILATERAL facet arthropathy. No impingement.  IMPRESSION: Multilevel spondylosis. Potentially symptomatic neural impingement at L3-4 and L4-5 as described above.  No occult lumbar spine fracture or concerning osseous lesion for metastatic disease.   Electronically Signed   By: Staci Righter M.D.   On: 11/16/2014 11:52    Lumbar DG 2-3 views:  Results for orders placed in visit on 01/02/14  DG Lumbar Spine 2-3 Views   Narrative * PRIOR REPORT IMPORTED FROM AN EXTERNAL SYSTEM *   CLINICAL DATA:  Initial valuation for acute trauma. Fall. Back pain.   EXAM:  LUMBAR SPINE - 2-3 VIEW   COMPARISON:  Prior study from 03/12/2013   FINDINGS:  Levoscoliosis of the lumbar spine is present. Normal lumbar lordosis  intact. Vertebral body heights preserved. No acute fracture or  listhesis.   Advanced  degenerative intervertebral disc space narrowing present at  L3-4. Multilevel facet arthropathy present within the lumbar spine.   No acute paraspinous soft tissue abnormality.   Diffuse osteopenia present.   IVC filter noted.   IMPRESSION:  1. No acute traumatic injury within the lumbar spine.  2. Levoscoliosis with multilevel degenerative disc disease, most  severe at L3-4.  3. Diffuse osteopenia.    Electronically Signed    By: Jeannine Boga M.D.    On: 01/02/2014 02:10       Hip Imaging: Hip-L CT wo contrast:  Results for orders placed during the hospital encounter of 07/02/14  CT Hip Left Wo Contrast   Narrative CLINICAL DATA:  Further evaluation of known left femur fracture  EXAM: CT OF THE  LEFT HIP WITHOUT CONTRAST  TECHNIQUE: Multidetector CT imaging of the left hip was performed according to the standard protocol. Multiplanar CT image reconstructions were also generated.  COMPARISON:  07/02/2014  FINDINGS: There is a fracture there is mild impaction at the fracture site. No significant joint effusion or hematoma. Pubic rami are intact. No evidence of femoral dislocation. Of the proximal femur are at the junction of the femoral neck and femoral head.  IMPRESSION: Fracture the proximal left femur best classified as subcapital.   Electronically Signed   By: Esperanza Heir M.D.   On: 07/02/2014 18:30    Hip-L DG 2-3 views:  Results for orders placed during the hospital encounter of 07/02/14  DG HIP UNILAT WITH PELVIS 2-3 VIEWS LEFT   Narrative CLINICAL DATA:  Left hip fracture. Status post internal fixation. Initial encounter.  EXAM: LEFT HIP (WITH PELVIS) 2-3 VIEWS  COMPARISON:  07/03/2014  FINDINGS: Three internal fixation screws are seen transfixing a left femoral neck fracture in near anatomic alignment. No evidence other fracture or dislocation.  IMPRESSION: Internal fixation of left femoral neck fracture in near  anatomic alignment.   Electronically Signed   By: Myles Rosenthal M.D.   On: 07/03/2014 12:39    Note: Imaging results reviewed.  ROS  Cardiovascular History: Heart trouble, Blood thinners:  Antiplatelet and Anticoagulant and Needs antibiotics prior to dental procedures (Xarelto) Pulmonary or Respiratory History: Shortness of breath Neurological History: Stroke (Residual deficits or weakness: None) Review of Past Neurological Studies:  Results for orders placed or performed during the hospital encounter of 09/27/15  CT HEAD WO CONTRAST   Narrative   CLINICAL DATA:  Severe right-sided headaches over the last 8 months. Neck pain. Cracking sound in the ear. EXAM: CT HEAD WITHOUT CONTRAST TECHNIQUE: Contiguous axial images were obtained from the base of the skull through the vertex without intravenous contrast. COMPARISON:  03/27/2015.  07/02/2014.  01/02/2014. FINDINGS: Mild generalized age related atrophy. Mild chronic small-vessel change of the cerebral hemispheric white matter. No sign of acute infarction, mass lesion, hemorrhage, hydrocephalus or extra-axial collection. The calvarium is unremarkable. No fluid in the visualized sinuses, middle ears or mastoids. Temporomandibular joints appear normal. There is atherosclerotic calcification of the major vessels at the base of the brain. IMPRESSION: No acute finding. No cause of the presenting symptoms is identified. Mild age related atrophy and chronic small vessel disease. There is atherosclerotic calcification of the major vessels at the base of the brain. Electronically Signed   By: Paulina Fusi M.D.   On: 09/27/2015 15:50   Psychological-Psychiatric History: Negative for anxiety, depression, schizophrenia, bipolar disorders or suicidal ideations or attempts Gastrointestinal History: Reflux or heatburn and Irritable Bowel Syndrome (IBS) Genitourinary History: Negative for nephrolithiasis, hematuria, renal failure or chronic  kidney disease Hematological History: Brusing easily and Bleeding easily Endocrine History: Negative for diabetes or thyroid disease Rheumatologic History: Fibromyalgia Musculoskeletal History: Negative for myasthenia gravis, muscular dystrophy, multiple sclerosis or malignant hyperthermia Work History: Disabled  Allergies  Ms. Koons is allergic to propoxyphene; alendronate sodium; atorvastatin; benadryl [diphenhydramine hcl]; codeine; cymbalta [duloxetine hcl]; levofloxacin; and prednisone.  Laboratory Chemistry  Inflammation Markers Lab Results  Component Value Date   ESRSEDRATE 35 (H) 03/27/2015   Renal Function Lab Results  Component Value Date   BUN 16 03/27/2015   CREATININE 0.88 03/27/2015   GFRAA >60 03/27/2015   GFRNONAA 60 (L) 03/27/2015   Hepatic Function Lab Results  Component Value Date   AST 22 02/08/2015   ALT  17 02/08/2015   ALBUMIN 3.9 02/08/2015   Electrolytes Lab Results  Component Value Date   NA 140 03/27/2015   K 4.2 03/27/2015   CL 106 03/27/2015   CALCIUM 9.5 03/27/2015   MG 1.8 11/07/2011   Pain Modulating Vitamins Lab Results  Component Value Date   VD125OH2TOT 38 07/19/2014   [id number] 34 07/19/2014   VD2125OH2 <10 07/19/2014   VITAMINB12 260 07/19/2014   Coagulation Parameters Lab Results  Component Value Date   INR 1.19 03/27/2015   LABPROT 15.3 (H) 03/27/2015   APTT 33 03/27/2015   PLT 195 03/27/2015   Cardiovascular Lab Results  Component Value Date   HGB 13.1 03/27/2015   HCT 39.3 03/27/2015    Note: Lab results reviewed.  [place]  Medical:  Ms. Starkel  has a past medical history of A-fib ([place]); Anemia; Anxiety; Breast cancer ([place]) (03/11/2014); Cancer ([place]); Closed fracture of neck of left femur with routine healing (08/07/2014); Depression; Fibromyalgia; GERD (gastroesophageal reflux disease); Hip fracture requiring operative repair ([place]) (07/04/2014); Pulmonary embolism ([place]); and Thyroid disease. Family: family  history includes Cancer in her father. Surgical:  has a past surgical history that includes Hip pinning, cannulated (Left, 07/03/2014); Colon surgery; Cholecystectomy; and Mastectomy. Tobacco:  reports that she has never smoked. She has never used smokeless tobacco. Alcohol:  reports that she does not drink alcohol. Drug:  has no drug history on file. Active Ambulatory Problems    Diagnosis Date Noted  . Acquired hypothyroidism 05/14/2014  . Benign essential HTN 05/14/2014  . Malignant neoplasm of breast ([place]) 03/11/2014  . Fibromyalgia 02/08/2015  . 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 Anemia, iron deficiency 11/01/2014  . Osteoarthritis of knee (Bilateral) (R>L) 05/23/2014  . Pulmonary embolism ([place]) 03/10/2014  . Neuroendocrine tumor 02/08/2015  . Anxiety and depression 05/14/2014  . Depression 03/10/2014  . Hyperlipidemia 11/28/2015  . Itching 10/17/2015  . Pure hypercholesterolemia 07/12/2015  . Urinary incontinence in female 10/17/2015  . Chronic pain 11/28/2015  . Long term current use of opiate analgesic 11/28/2015  . Long term prescription opiate use 11/28/2015  . Opiate use (15 MME/Day) 11/28/2015  . Long term prescription benzodiazepine use 11/28/2015  . Unilateral occipital headache (Right) 11/28/2015  . Multilevel spine pain (Location of Tertiary source of pain) (lumbar > thoracic) 11/28/2015  . Encounter for therapeutic drug level monitoring 11/28/2015  . Encounter for pain management planning 11/28/2015  . Chronic neck pain (Location of Secondary source of pain) (Bilateral) (R>L) 11/28/2015  . Cervical spine pain 11/28/2015  . Thoracic spine pain 11/28/2015  . Lumbar spine pain 11/28/2015  . Occipital neuralgia (Location of Primary Source of Pain) (Bilateral) (R>L) 11/29/2015  . Chronic knee pain (Bilateral) (R>L) 11/29/2015  . Chronic abdominal pain (RLQ) 11/29/2015  . 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 Chronic  anticoagulation with Xarelto (stroke/pulmonary embolism) 11/29/2015  . History of breast cancer 11/29/2015   Resolved Ambulatory Problems    Diagnosis Date Noted  . Hip fracture requiring operative repair ([place]) 07/04/2014  . Closed fracture of neck of left femur with routine healing 08/07/2014  . Breast cancer ([place]) 03/11/2014   Past Medical History:  Diagnosis Date  . A-fib ([place])   . Anemia   . Anxiety   . Breast cancer ([place]) 03/11/2014  . Cancer ([place])   . Closed fracture of neck of left femur with routine healing 08/07/2014  . Depression   . Fibromyalgia   . GERD (gastroesophageal reflux disease)   .  Hip fracture requiring operative repair ([place]) 07/04/2014  . Pulmonary embolism ([place])   . Thyroid disease     Constitutional Exam  General appearance: Well nourished, well developed, and well hydrated. In no acute distress Vitals:   11/28/15 1358  BP: 133/69  Pulse: 73  Resp: 16  Temp: 98.4 F (36.9 C)  TempSrc: Oral  SpO2: 98%  Weight: 133 lb (60.3 kg)  Height: '5\' 2"'$  (1.575 m)  BMI Assessment: Estimated body mass index is 24.33 kg/m as calculated from the following:   Height as of this encounter: '5\' 2"'$  (1.575 m).   Weight as of this encounter: 133 lb (60.3 kg).   BMI interpretation: (18.5-24.9 kg/m2) = Ideal body weight BMI Readings from Last 4 Encounters:  11/28/15 24.33 kg/m  09/28/15 24.33 kg/m  03/27/15 26.16 kg/m  02/08/15 26.58 kg/m   Wt Readings from Last 4 Encounters:  11/28/15 133 lb (60.3 kg)  09/28/15 133 lb (60.3 kg)  03/27/15 143 lb (64.9 kg)  02/08/15 145 lb 3 oz (65.9 kg)  Psych/Mental status: Alert and oriented x 3 (person, place, & time) Eyes: PERLA Respiratory: No evidence of acute respiratory distress  Cervical Spine Exam  Inspection: No masses, redness, or swelling Alignment: Symmetrical Functional ROM: Unrestricted ROM Stability: No instability detected Muscle strength & Tone: Functionally intact Sensory: Unimpaired Palpation:  Non-contributory  Upper Extremity (UE) Exam    Side: Right upper extremity  Side: Left upper extremity  Inspection: No masses, redness, swelling, or asymmetry  Inspection: No masses, redness, swelling, or asymmetry  Functional ROM: Unrestricted ROM         Functional ROM: Unrestricted ROM          Muscle strength & Tone: Functionally intact  Muscle strength & Tone: Functionally intact  Sensory: Unimpaired  Sensory: Unimpaired  Palpation: Non-contributory  Palpation: Non-contributory   Thoracic Spine Exam  Inspection: No masses, redness, or swelling Alignment: Symmetrical Functional ROM: Unrestricted ROM Stability: No instability detected Sensory: Unimpaired Muscle strength & Tone: Functionally intact Palpation: Non-contributory  Lumbar Spine Exam  Inspection: No masses, redness, or swelling Alignment: Symmetrical Functional ROM: Unrestricted ROM Stability: No instability detected Muscle strength & Tone: Functionally intact Sensory: Unimpaired Palpation: Non-contributory Provocative Tests: Lumbar Hyperextension and rotation test: evaluation deferred today       Patrick's Maneuver: evaluation deferred today              Gait & Posture Assessment  Ambulation: Unassisted Gait: Relatively normal for age and body habitus Posture: WNL   Lower Extremity Exam    Side: Right lower extremity  Side: Left lower extremity  Inspection: No masses, redness, swelling, or asymmetry  Inspection: No masses, redness, swelling, or asymmetry  Functional ROM: Unrestricted ROM          Functional ROM: Unrestricted ROM          Muscle strength & Tone: Functionally intact  Muscle strength & Tone: Functionally intact  Sensory: Unimpaired  Sensory: Unimpaired  Palpation: Non-contributory  Palpation: Non-contributory    Assessment  Primary Diagnosis & Pertinent Problem List: The primary encounter diagnosis was Chronic pain. Diagnoses of Long term current use of opiate analgesic, Long term  prescription opiate use, Opiate use, Long term prescription benzodiazepine use, Fibromyalgia, Unilateral occipital headache (Right), Multilevel spine pain, Encounter for therapeutic drug level monitoring, Encounter for pain management planning, Chronic neck pain (Right), Cervical spine pain, Thoracic spine pain, Lumbar spine pain, Occipital neuralgia (Location of Primary Source of Pain) (Bilateral) (R>L), Chronic knee pain (Bilateral) (  R>L), Chronic abdominal pain (RLQ), Primary osteoarthritis of both knees, Failed back surgical syndrome, Chronic anticoagulation with Xarelto (stroke/pulmonary embolism), and History of breast cancer were also pertinent to this visit.  Visit Diagnosis: 1. Chronic pain   2. Long term current use of opiate analgesic   3. Long term prescription opiate use   4. Opiate use   5. Long term prescription benzodiazepine use   6. Fibromyalgia   7. Unilateral occipital headache (Right)   8. Multilevel spine pain   9. Encounter for therapeutic drug level monitoring   10. Encounter for pain management planning   11. Chronic neck pain (Right)   12. Cervical spine pain   13. Thoracic spine pain   14. Lumbar spine pain   15. Occipital neuralgia (Location of Primary Source of Pain) (Bilateral) (R>L)   16. Chronic knee pain (Bilateral) (R>L)   17. Chronic abdominal pain (RLQ)   18. Primary osteoarthritis of both knees   19. Failed back surgical syndrome   20. Chronic anticoagulation with Xarelto (stroke/pulmonary embolism)   21. History of breast cancer    Plan of Care  Initial Treatment Plan:  Please be advised that as per protocol, today's visit has been an evaluation only. We have not taken over the patient's controlled substance management.  Problem-Specific Plan: No problem-specific Assessment & Plan notes found for this encounter.  Ordered Lab-work, Procedure(s), & Referral(s) : Orders Placed This Encounter  Procedures  . DG Cervical Spine Complete  . DG  Thoracic Spine 2 View  . DG Lumbar Spine Complete W/Bend  . ToxASSURE Select 13 (MW), Urine  . Comprehensive metabolic panel  . C-reactive protein  . Magnesium  . Sedimentation rate  . Vitamin B12  . 25-Hydroxyvitamin D Lcms D2+D3  . Ambulatory referral to Psychology  Referral(s) or Consult(s): Medical psychology consult for substance use disorder evaluation Pharmacotherapy: Medications ordered:  No orders of the defined types were placed in this encounter.  Prescriptions ordered during this visit: New Prescriptions   No medications on file   Medications administered during this visit: Ms. Ruggles had no medications administered during this visit.   Pharmacotherapy plan under consideration:  The patient's last prescription was for tramadol 50 mg 1 tab po 3 times a day.   Interventional Therapies: Interventional procedures under consideration:  Right cervical epidural steroid injection under fluoroscopic guidance and IV sedation.  Diagnostic right-sided cervical facet block under fluoroscopic guidance and IV sedation.  Possible right-sided cervical facet radiofrequency ablation.  Diagnostic right-sided greater and lesser occipital nerve blocks under fluoroscopic guidance.  Possible occipital nerve radiofrequency ablation.  Diagnostic C2 + TON block. Possible C2 + TON RFA.    Requested PM Follow-up: Return for 2nd Visit Eval, After [facility] Eval.  No future appointments.  Primary Care Physician: Glendon Axe, MD Location: [facility] Note by: Kathlen Brunswick. Dossie Arbour, M.D, DABA, DABAPM, DABPM, DABIPP, FIPP  Pain Score Disclaimer: We use the NRS-11 scale. This is a self-reported, subjective measurement of pain severity with only modest accuracy. It is used primarily to identify changes within a particular patient. It must be understood that outpatient pain scales are significantly less accurate that those used for research, where they can be applied  under ideal controlled circumstances with minimal exposure to variables. In reality, the score is likely to be a combination of pain intensity and pain affect, where pain affect describes the degree of emotional arousal or changes in action readiness caused by the sensory experience of pain. Factors such  as social and work situation, setting, emotional state, anxiety levels, expectation, and prior pain experience may influence pain perception and show large inter-individual differences that may also be affected by time variables.  Patient instructions provided during this appointment: Patient Instructions   Epidural Steroid Injection An epidural steroid injection is given to relieve pain in your neck, back, or legs that is caused by the irritation or swelling of a nerve root. This procedure involves injecting a steroid and numbing medicine (anesthetic) into the epidural space. The epidural space is the space between the outer covering of your spinal cord and the bones that form your backbone (vertebra).  LET [facility] CARE PROVIDER KNOW ABOUT:   Any allergies you have.  All medicines you are taking, including vitamins, herbs, eye drops, creams, and over-the-counter medicines such as aspirin.  Previous problems you or members of your family have had with the use of anesthetics.  Any blood disorders or blood clotting disorders you have.  Previous surgeries you have had.  Medical conditions you have. RISKS AND COMPLICATIONS Generally, this is a safe procedure. However, as with any procedure, complications can occur. Possible complications of epidural steroid injection include:  Headache.  Bleeding.  Infection.  Allergic reaction to the medicines.  Damage to your nerves. The response to this procedure depends on the underlying cause of the pain and its duration. People who have long-term (chronic) pain are less likely to benefit from epidural steroids than are those people whose pain  comes on strong and suddenly. BEFORE THE PROCEDURE   Ask your health care provider about changing or stopping your regular medicines. You may be advised to stop taking blood-thinning medicines a few days before the procedure.  You may be given medicines to reduce anxiety.  Arrange for someone to take you home after the procedure. PROCEDURE   You will remain awake during the procedure. You may receive medicine to make you relaxed.  You will be asked to lie on your stomach.  The injection site will be cleaned.  The injection site will be numbed with a medicine (local anesthetic).  A needle will be injected through your skin into the epidural space.  Your health care provider will use an X-ray machine to ensure that the steroid is delivered closest to the affected nerve. You may have minimal discomfort at this time.  Once the needle is in the right position, the local anesthetic and the steroid will be injected into the epidural space.  The needle will then be removed and a bandage will be applied to the injection site. AFTER THE PROCEDURE   You may be monitored for a short time before you go home.  You may feel weakness or numbness in your arm or leg, which disappears within hours.  You may be allowed to eat, drink, and take your regular medicine.  You may have soreness at the site of the injection.   This information is not intended to replace advice given to you by your health care provider. Make sure you discuss any questions you have with your health care provider.   Document Released: 05/28/2007 Document Revised: 10/21/2012 Document Reviewed: 08/07/2012 Elsevier Interactive Patient Education 2016 [place]  What are the risk, side effects and possible complications? Generally speaking, most procedures are safe.  However, with any procedure there are risks, side effects, and the possibility of complications.  The risks and complications  are dependent upon the sites that are lesioned, or the type  of nerve block to be performed.  The closer the procedure is to the spine, the more serious the risks are.  Great care is taken when placing the radio frequency needles, block needles or lesioning probes, but sometimes complications can occur. 1. Infection: Any time there is an injection through the skin, there is a risk of infection.  This is why sterile conditions are used for these blocks.  There are four possible types of infection. 1. Localized skin infection. 2. Central Nervous System Infection-This can be in the form of Meningitis, which can be deadly. 3. Epidural Infections-This can be in the form of an epidural abscess, which can cause pressure inside of the spine, causing compression of the spinal cord with subsequent paralysis. This would require an emergency surgery to decompress, and there are no guarantees that the patient would recover from the paralysis. 4. Discitis-This is an infection of the intervertebral discs.  It occurs in about 1% of discography procedures.  It is difficult to treat and it may lead to surgery.        2. Pain: the needles have to go through skin and soft tissues, will cause soreness.       3. Damage to internal structures:  The nerves to be lesioned may be near blood vessels or    other nerves which can be potentially damaged.       4. Bleeding: Bleeding is more common if the patient is taking blood thinners such as  aspirin, Coumadin, Ticiid, Plavix, etc., or if he/she have some genetic predisposition  such as hemophilia. Bleeding into the spinal canal can cause compression of the spinal  cord with subsequent paralysis.  This would require an emergency surgery to  decompress and there are no guarantees that the patient would recover from the  paralysis.       5. Pneumothorax:  Puncturing of a lung is a possibility, every time a needle is introduced in  the area of the chest or upper back.  Pneumothorax  refers to free air around the  collapsed lung(s), inside of the thoracic cavity (chest cavity).  Another two possible  complications related to a similar event would include: Hemothorax and Chylothorax.   These are variations of the Pneumothorax, where instead of air around the collapsed  lung(s), you may have blood or chyle, respectively.       6. Spinal headaches: They may occur with any procedures in the area of the spine.       7. Persistent CSF (Cerebro-Spinal Fluid) leakage: This is a rare problem, but may occur  with prolonged intrathecal or epidural catheters either due to the formation of a fistulous  track or a dural tear.       8. Nerve damage: By working so close to the spinal cord, there is always a possibility of  nerve damage, which could be as serious as a permanent spinal cord injury with  paralysis.       9. Death:  Although rare, severe deadly allergic reactions known as "Anaphylactic  reaction" can occur to any of the medications used.      10. Worsening of the symptoms:  We can always make thing worse.  What are the chances of something like this happening? Chances of any of this occuring are extremely low.  By statistics, you have more of a chance of getting killed in a motor vehicle accident: while driving to the hospital than any of the above occurring .  Nevertheless, you  should be aware that they are possibilities.  In general, it is similar to taking a shower.  Everybody knows that you can slip, hit your head and get killed.  Does that mean that you should not shower again?  Nevertheless always keep in mind that statistics do not mean anything if you happen to be on the wrong side of them.  Even if a procedure has a 1 (one) in a 1,000,000 (million) chance of going wrong, it you happen to be that one..Also, keep in mind that by statistics, you have more of a chance of having something go wrong when taking medications.  Who should not have this procedure? If you are on a blood  thinning medication (e.g. Coumadin, Plavix, see list of "Blood Thinners"), or if you have an active infection going on, you should not have the procedure.  If you are taking any blood thinners, please inform your physician.  How should I prepare for this procedure?  Do not eat or drink anything at least six hours prior to the procedure.  Bring a driver with you .  It cannot be a taxi.  Come accompanied by an adult that can drive you back, and that is strong enough to help you if your legs get weak or numb from the local anesthetic.  Take all of your medicines the morning of the procedure with just enough water to swallow them.  If you have diabetes, make sure that you are scheduled to have your procedure done first thing in the morning, whenever possible.  If you have diabetes, take only half of your insulin dose and notify our nurse that you have done so as soon as you arrive at the clinic.  If you are diabetic, but only take blood sugar pills (oral hypoglycemic), then do not take them on the morning of your procedure.  You may take them after you have had the procedure.  Do not take aspirin or any aspirin-containing medications, at least eleven (11) days prior to the procedure.  They may prolong bleeding.  Wear loose fitting clothing that may be easy to take off and that you would not mind if it got stained with Betadine or blood.  Do not wear any jewelry or perfume  Remove any nail coloring.  It will interfere with some of our monitoring equipment.  NOTE: Remember that this is not meant to be interpreted as a complete list of all possible complications.  Unforeseen problems may occur.  BLOOD THINNERS The following drugs contain aspirin or other products, which can cause increased bleeding during surgery and should not be taken for 2 weeks prior to and 1 week after surgery.  If you should need take something for relief of minor pain, you may take acetaminophen which is found in  Tylenol,m Datril, Anacin-3 and Panadol. It is not blood thinner. The products listed below are.  Do not take any of the products listed below in addition to any listed on your instruction sheet.  A.P.C or A.P.C with Codeine Codeine Phosphate Capsules #3 Ibuprofen Ridaura  ABC compound Congesprin Imuran rimadil  Advil Cope Indocin Robaxisal  Alka-Seltzer Effervescent Pain Reliever and Antacid Coricidin or Coricidin-D  Indomethacin Rufen  Alka-Seltzer plus Cold Medicine Cosprin Ketoprofen S-A-C Tablets  Anacin Analgesic Tablets or Capsules Coumadin Korlgesic Salflex  Anacin Extra Strength Analgesic tablets or capsules CP-2 Tablets Lanoril Salicylate  Anaprox Cuprimine Capsules Levenox Salocol  Anexsia-D Dalteparin Magan Salsalate  Anodynos Darvon compound Magnesium Salicylate Sine-off  Ansaid Dasin  Capsules Magsal Sodium Salicylate  Anturane Depen Capsules Marnal Soma  APF Arthritis pain formula Dewitt's Pills Measurin Stanback  Argesic Dia-Gesic Meclofenamic Sulfinpyrazone  Arthritis Bayer Timed Release Aspirin Diclofenac Meclomen Sulindac  Arthritis pain formula Anacin Dicumarol Medipren Supac  Analgesic (Safety coated) Arthralgen Diffunasal Mefanamic Suprofen  Arthritis Strength Bufferin Dihydrocodeine Mepro Compound Suprol  Arthropan liquid Dopirydamole Methcarbomol with Aspirin Synalgos  ASA tablets/Enseals Disalcid Micrainin Tagament  Ascriptin Doan's Midol Talwin  Ascriptin A/D Dolene Mobidin Tanderil  Ascriptin Extra Strength Dolobid Moblgesic Ticlid  Ascriptin with Codeine Doloprin or Doloprin with Codeine Momentum Tolectin  Asperbuf Duoprin Mono-gesic Trendar  Aspergum Duradyne Motrin or Motrin IB Triminicin  Aspirin plain, buffered or enteric coated Durasal Myochrisine Trigesic  Aspirin Suppositories Easprin Nalfon Trillsate  Aspirin with Codeine Ecotrin Regular or Extra Strength Naprosyn Uracel  Atromid-S Efficin Naproxen Ursinus  Auranofin Capsules Elmiron Neocylate  Vanquish  Axotal Emagrin Norgesic Verin  Azathioprine Empirin or Empirin with Codeine Normiflo Vitamin E  Azolid Emprazil Nuprin Voltaren  Bayer Aspirin plain, buffered or children's or timed BC Tablets or powders Encaprin Orgaran Warfarin Sodium  Buff-a-Comp Enoxaparin Orudis Zorpin  Buff-a-Comp with Codeine Equegesic Os-Cal-Gesic   Buffaprin Excedrin plain, buffered or Extra Strength Oxalid   Bufferin Arthritis Strength Feldene Oxphenbutazone   Bufferin plain or Extra Strength Feldene Capsules Oxycodone with Aspirin   Bufferin with Codeine Fenoprofen Fenoprofen Pabalate or Pabalate-SF   Buffets II Flogesic Panagesic   Buffinol plain or Extra Strength Florinal or Florinal with Codeine Panwarfarin   Buf-Tabs Flurbiprofen Penicillamine   Butalbital Compound Four-way cold tablets Penicillin   Butazolidin Fragmin Pepto-Bismol   Carbenicillin Geminisyn Percodan   Carna Arthritis Reliever Geopen Persantine   Carprofen Gold's salt Persistin   Chloramphenicol Goody's Phenylbutazone   Chloromycetin Haltrain Piroxlcam   Clmetidine heparin Plaquenil   Cllnoril Hyco-pap Ponstel   Clofibrate Hydroxy chloroquine Propoxyphen         Before stopping any of these medications, be sure to consult the physician who ordered them.  Some, such as Coumadin (Warfarin) are ordered to prevent or treat serious conditions such as "deep thrombosis", "pumonary embolisms", and other heart problems.  The amount of time that you may need off of the medication may also vary with the medication and the reason for which you were taking it.  If you are taking any of these medications, please make sure you notify your pain physician before you undergo any procedures.

## 2015-11-28 NOTE — Patient Instructions (Signed)
Epidural Steroid Injection An epidural steroid injection is given to relieve pain in your neck, back, or legs that is caused by the irritation or swelling of a nerve root. This procedure involves injecting a steroid and numbing medicine (anesthetic) into the epidural space. The epidural space is the space between the outer covering of your spinal cord and the bones that form your backbone (vertebra).  LET [facility] CARE PROVIDER KNOW ABOUT:   Any allergies you have.  All medicines you are taking, including vitamins, herbs, eye drops, creams, and over-the-counter medicines such as aspirin.  Previous problems you or members of your family have had with the use of anesthetics.  Any blood disorders or blood clotting disorders you have.  Previous surgeries you have had.  Medical conditions you have. RISKS AND COMPLICATIONS Generally, this is a safe procedure. However, as with any procedure, complications can occur. Possible complications of epidural steroid injection include:  Headache.  Bleeding.  Infection.  Allergic reaction to the medicines.  Damage to your nerves. The response to this procedure depends on the underlying cause of the pain and its duration. People who have long-term (chronic) pain are less likely to benefit from epidural steroids than are those people whose pain comes on strong and suddenly. BEFORE THE PROCEDURE   Ask your health care provider about changing or stopping your regular medicines. You may be advised to stop taking blood-thinning medicines a few days before the procedure.  You may be given medicines to reduce anxiety.  Arrange for someone to take you home after the procedure. PROCEDURE   You will remain awake during the procedure. You may receive medicine to make you relaxed.  You will be asked to lie on your stomach.  The injection site will be cleaned.  The injection site will be numbed with a medicine (local anesthetic).  A needle will be  injected through your skin into the epidural space.  Your health care provider will use an X-ray machine to ensure that the steroid is delivered closest to the affected nerve. You may have minimal discomfort at this time.  Once the needle is in the right position, the local anesthetic and the steroid will be injected into the epidural space.  The needle will then be removed and a bandage will be applied to the injection site. AFTER THE PROCEDURE   You may be monitored for a short time before you go home.  You may feel weakness or numbness in your arm or leg, which disappears within hours.  You may be allowed to eat, drink, and take your regular medicine.  You may have soreness at the site of the injection.   This information is not intended to replace advice given to you by your health care provider. Make sure you discuss any questions you have with your health care provider.   Document Released: 05/28/2007 Document Revised: 10/21/2012 Document Reviewed: 08/07/2012 Elsevier Interactive Patient Education 2016 [place]  What are the risk, side effects and possible complications? Generally speaking, most procedures are safe.  However, with any procedure there are risks, side effects, and the possibility of complications.  The risks and complications are dependent upon the sites that are lesioned, or the type of nerve block to be performed.  The closer the procedure is to the spine, the more serious the risks are.  Great care is taken when placing the radio frequency needles, block needles or lesioning probes, but sometimes complications can occur. 1. Infection:  Any time there is an injection through the skin, there is a risk of infection.  This is why sterile conditions are used for these blocks.  There are four possible types of infection. 1. Localized skin infection. 2. Central Nervous System Infection-This can be in the form of Meningitis, which can be  deadly. 3. Epidural Infections-This can be in the form of an epidural abscess, which can cause pressure inside of the spine, causing compression of the spinal cord with subsequent paralysis. This would require an emergency surgery to decompress, and there are no guarantees that the patient would recover from the paralysis. 4. Discitis-This is an infection of the intervertebral discs.  It occurs in about 1% of discography procedures.  It is difficult to treat and it may lead to surgery.        2. Pain: the needles have to go through skin and soft tissues, will cause soreness.       3. Damage to internal structures:  The nerves to be lesioned may be near blood vessels or    other nerves which can be potentially damaged.       4. Bleeding: Bleeding is more common if the patient is taking blood thinners such as  aspirin, Coumadin, Ticiid, Plavix, etc., or if he/she have some genetic predisposition  such as hemophilia. Bleeding into the spinal canal can cause compression of the spinal  cord with subsequent paralysis.  This would require an emergency surgery to  decompress and there are no guarantees that the patient would recover from the  paralysis.       5. Pneumothorax:  Puncturing of a lung is a possibility, every time a needle is introduced in  the area of the chest or upper back.  Pneumothorax refers to free air around the  collapsed lung(s), inside of the thoracic cavity (chest cavity).  Another two possible  complications related to a similar event would include: Hemothorax and Chylothorax.   These are variations of the Pneumothorax, where instead of air around the collapsed  lung(s), you may have blood or chyle, respectively.       6. Spinal headaches: They may occur with any procedures in the area of the spine.       7. Persistent CSF (Cerebro-Spinal Fluid) leakage: This is a rare problem, but may occur  with prolonged intrathecal or epidural catheters either due to the formation of a fistulous  track  or a dural tear.       8. Nerve damage: By working so close to the spinal cord, there is always a possibility of  nerve damage, which could be as serious as a permanent spinal cord injury with  paralysis.       9. Death:  Although rare, severe deadly allergic reactions known as "Anaphylactic  reaction" can occur to any of the medications used.      10. Worsening of the symptoms:  We can always make thing worse.  What are the chances of something like this happening? Chances of any of this occuring are extremely low.  By statistics, you have more of a chance of getting killed in a motor vehicle accident: while driving to the hospital than any of the above occurring .  Nevertheless, you should be aware that they are possibilities.  In general, it is similar to taking a shower.  Everybody knows that you can slip, hit your head and get killed.  Does that mean that you should not shower again?  Nevertheless always   do not mean anything if you happen to be on the wrong side of them.  Even if a procedure has a 1 (one) in a 1,000,000 (million) chance of going wrong, it you happen to be that one..Also, keep in mind that by statistics, you have more of a chance of having something go wrong when taking medications.  Who should not have this procedure? If you are on a blood thinning medication (e.g. Coumadin, Plavix, see list of "Blood Thinners"), or if you have an active infection going on, you should not have the procedure.  If you are taking any blood thinners, please inform your physician.  How should I prepare for this procedure? Do not eat or drink anything at least six hours prior to the procedure. Bring a driver with you .  It cannot be a taxi. Come accompanied by an adult that can drive you back, and that is strong enough to help you if your legs get weak or numb from the local anesthetic. Take all of your medicines the morning of the procedure with just enough water to swallow  them. If you have diabetes, make sure that you are scheduled to have your procedure done first thing in the morning, whenever possible. If you have diabetes, take only half of your insulin dose and notify our nurse that you have done so as soon as you arrive at the clinic. If you are diabetic, but only take blood sugar pills (oral hypoglycemic), then do not take them on the morning of your procedure.  You may take them after you have had the procedure. Do not take aspirin or any aspirin-containing medications, at least eleven (11) days prior to the procedure.  They may prolong bleeding. Wear loose fitting clothing that may be easy to take off and that you would not mind if it got stained with Betadine or blood. Do not wear any jewelry or perfume Remove any nail coloring.  It will interfere with some of our monitoring equipment.  NOTE: Remember that this is not meant to be interpreted as a complete list of all possible complications.  Unforeseen problems may occur.  BLOOD THINNERS The following drugs contain aspirin or other products, which can cause increased bleeding during surgery and should not be taken for 2 weeks prior to and 1 week after surgery.  If you should need take something for relief of minor pain, you may take acetaminophen which is found in Tylenol,m Datril, Anacin-3 and Panadol. It is not blood thinner. The products listed below are.  Do not take any of the products listed below in addition to any listed on your instruction sheet.  A.P.C or A.P.C with Codeine Codeine Phosphate Capsules #3 Ibuprofen Ridaura  ABC compound Congesprin Imuran rimadil  Advil Cope Indocin Robaxisal  Alka-Seltzer Effervescent Pain Reliever and Antacid Coricidin or Coricidin-D  Indomethacin Rufen  Alka-Seltzer plus Cold Medicine Cosprin Ketoprofen S-A-C Tablets  Anacin Analgesic Tablets or Capsules Coumadin Korlgesic Salflex  Anacin Extra Strength Analgesic tablets or capsules CP-2 Tablets Lanoril  Salicylate  Anaprox Cuprimine Capsules Levenox Salocol  Anexsia-D Dalteparin Magan Salsalate  Anodynos Darvon compound Magnesium Salicylate Sine-off  Ansaid Dasin Capsules Magsal Sodium Salicylate  Anturane Depen Capsules Marnal Soma  APF Arthritis pain formula Dewitt's Pills Measurin Stanback  Argesic Dia-Gesic Meclofenamic Sulfinpyrazone  Arthritis Bayer Timed Release Aspirin Diclofenac Meclomen Sulindac  Arthritis pain formula Anacin Dicumarol Medipren Supac  Analgesic (Safety coated) Arthralgen Diffunasal Mefanamic Suprofen  Arthritis Strength Bufferin Dihydrocodeine Mepro Compound Suprol  Arthropan   liquid Dopirydamole Methcarbomol with Aspirin Synalgos  ASA tablets/Enseals Disalcid Micrainin Tagament  Ascriptin Doan's Midol Talwin  Ascriptin A/D Dolene Mobidin Tanderil  Ascriptin Extra Strength Dolobid Moblgesic Ticlid  Ascriptin with Codeine Doloprin or Doloprin with Codeine Momentum Tolectin  Asperbuf Duoprin Mono-gesic Trendar  Aspergum Duradyne Motrin or Motrin IB Triminicin  Aspirin plain, buffered or enteric coated Durasal Myochrisine Trigesic  Aspirin Suppositories Easprin Nalfon Trillsate  Aspirin with Codeine Ecotrin Regular or Extra Strength Naprosyn Uracel  Atromid-S Efficin Naproxen Ursinus  Auranofin Capsules Elmiron Neocylate Vanquish  Axotal Emagrin Norgesic Verin  Azathioprine Empirin or Empirin with Codeine Normiflo Vitamin E  Azolid Emprazil Nuprin Voltaren  Bayer Aspirin plain, buffered or children's or timed BC Tablets or powders Encaprin Orgaran Warfarin Sodium  Buff-a-Comp Enoxaparin Orudis Zorpin  Buff-a-Comp with Codeine Equegesic Os-Cal-Gesic   Buffaprin Excedrin plain, buffered or Extra Strength Oxalid   Bufferin Arthritis Strength Feldene Oxphenbutazone   Bufferin plain or Extra Strength Feldene Capsules Oxycodone with Aspirin   Bufferin with Codeine Fenoprofen Fenoprofen Pabalate or Pabalate-SF   Buffets II Flogesic Panagesic   Buffinol plain or  Extra Strength Florinal or Florinal with Codeine Panwarfarin   Buf-Tabs Flurbiprofen Penicillamine   Butalbital Compound Four-way cold tablets Penicillin   Butazolidin Fragmin Pepto-Bismol   Carbenicillin Geminisyn Percodan   Carna Arthritis Reliever Geopen Persantine   Carprofen Gold's salt Persistin   Chloramphenicol Goody's Phenylbutazone   Chloromycetin Haltrain Piroxlcam   Clmetidine heparin Plaquenil   Cllnoril Hyco-pap Ponstel   Clofibrate Hydroxy chloroquine Propoxyphen         Before stopping any of these medications, be sure to consult the physician who ordered them.  Some, such as Coumadin (Warfarin) are ordered to prevent or treat serious conditions such as "deep thrombosis", "pumonary embolisms", and other heart problems.  The amount of time that you may need off of the medication may also vary with the medication and the reason for which you were taking it.  If you are taking any of these medications, please make sure you notify your pain physician before you undergo any procedures.          

## 2015-11-29 ENCOUNTER — Encounter: Payer: Self-pay | Admitting: Pain Medicine

## 2015-11-29 DIAGNOSIS — R1031 Right lower quadrant pain: Secondary | ICD-10-CM

## 2015-11-29 DIAGNOSIS — Z7901 Long term (current) use of anticoagulants: Secondary | ICD-10-CM | POA: Insufficient documentation

## 2015-11-29 DIAGNOSIS — M25561 Pain in right knee: Secondary | ICD-10-CM

## 2015-11-29 DIAGNOSIS — Z853 Personal history of malignant neoplasm of breast: Secondary | ICD-10-CM | POA: Insufficient documentation

## 2015-11-29 DIAGNOSIS — M25562 Pain in left knee: Secondary | ICD-10-CM

## 2015-11-29 DIAGNOSIS — G8929 Other chronic pain: Secondary | ICD-10-CM | POA: Insufficient documentation

## 2015-11-29 DIAGNOSIS — M961 Postlaminectomy syndrome, not elsewhere classified: Secondary | ICD-10-CM | POA: Insufficient documentation

## 2015-11-29 DIAGNOSIS — M5481 Occipital neuralgia: Secondary | ICD-10-CM | POA: Insufficient documentation

## 2015-11-29 NOTE — Telephone Encounter (Signed)
Spoke with Amy from Dr Keturah Barre office.  Dr Candiss Norse has approved patient to stop Xarelto x 3 days prior to procedure and restart Xarelto 6 hours post procedure.  Fax was sent over to their office and asked if they could have Dr Candiss Norse sign for approval so that we would have proper documentation.

## 2015-11-29 NOTE — Telephone Encounter (Signed)
Spoke with patient to let her know that we did receive approval from Dr Keturah Barre office for her to stop Xarelto 3 days prior to procedure and begin again 6 hours post.  Patient will call to make appt after med/psych evaluation

## 2015-12-06 LAB — TOXASSURE SELECT 13 (MW), URINE

## 2016-02-09 ENCOUNTER — Other Ambulatory Visit: Payer: Self-pay | Admitting: Internal Medicine

## 2016-02-09 DIAGNOSIS — R3129 Other microscopic hematuria: Secondary | ICD-10-CM

## 2016-02-21 ENCOUNTER — Ambulatory Visit: Payer: Self-pay | Admitting: Urology

## 2016-02-23 ENCOUNTER — Ambulatory Visit: Payer: Medicare Other

## 2016-03-05 ENCOUNTER — Ambulatory Visit
Admission: RE | Admit: 2016-03-05 | Discharge: 2016-03-05 | Disposition: A | Discharge status: Home / Self Care | Payer: Medicare Other | Source: Ambulatory Visit | Attending: Internal Medicine | Admitting: Internal Medicine

## 2016-03-05 DIAGNOSIS — K449 Diaphragmatic hernia without obstruction or gangrene: Secondary | ICD-10-CM | POA: Insufficient documentation

## 2016-03-05 DIAGNOSIS — R3129 Other microscopic hematuria: Secondary | ICD-10-CM

## 2016-03-05 DIAGNOSIS — R319 Hematuria, unspecified: Secondary | ICD-10-CM | POA: Insufficient documentation

## 2016-03-05 DIAGNOSIS — I700 Atherosclerosis of aorta: Secondary | ICD-10-CM | POA: Diagnosis not present

## 2016-03-05 LAB — POCT I-STAT CREATININE: CREATININE: 1 mg/dL (ref 0.44–1.00)

## 2016-03-05 MED ORDER — IOPAMIDOL (ISOVUE-300) INJECTION 61%
125.0000 mL | Freq: Once | INTRAVENOUS | Status: AC | PRN
  Administered 2016-03-05: 125 mL via INTRAVENOUS

## 2016-03-12 ENCOUNTER — Ambulatory Visit: Payer: Self-pay | Admitting: Urology

## 2016-03-26 ENCOUNTER — Ambulatory Visit: Payer: Self-pay | Admitting: Urology

## 2016-04-17 ENCOUNTER — Ambulatory Visit (INDEPENDENT_AMBULATORY_CARE_PROVIDER_SITE_OTHER): Payer: Medicare Other | Admitting: Urology

## 2016-04-17 ENCOUNTER — Encounter: Payer: Self-pay | Admitting: Urology

## 2016-04-17 VITALS — BP 126/75 | HR 99 | Ht 62.0 in | Wt 133.9 lb

## 2016-04-17 DIAGNOSIS — N3281 Overactive bladder: Secondary | ICD-10-CM | POA: Diagnosis not present

## 2016-04-17 DIAGNOSIS — N289 Disorder of kidney and ureter, unspecified: Secondary | ICD-10-CM

## 2016-04-17 DIAGNOSIS — R3129 Other microscopic hematuria: Secondary | ICD-10-CM | POA: Diagnosis not present

## 2016-04-17 DIAGNOSIS — N3289 Other specified disorders of bladder: Secondary | ICD-10-CM

## 2016-04-17 LAB — URINALYSIS, COMPLETE
BILIRUBIN UA: NEGATIVE
Glucose, UA: NEGATIVE
Nitrite, UA: NEGATIVE
PH UA: 5 (ref 5.0–7.5)
RBC, UA: NEGATIVE
Specific Gravity, UA: >1.03 — ABNORMAL HIGH (ref 1.005–1.030)
Urobilinogen, Ur: 0.2 mg/dL (ref 0.2–1.0)

## 2016-04-17 LAB — MICROSCOPIC EXAMINATION: RBC, UA: NONE SEEN /hpf (ref 0–?)

## 2016-04-17 NOTE — Patient Instructions (Addendum)
Cystoscopy Cystoscopy is a procedure that is used to help diagnose and sometimes treat conditions that affect that lower urinary tract. The lower urinary tract includes the bladder and the tube that drains urine from the bladder out of the body (urethra). Cystoscopy is performed with a thin, tube-shaped instrument with a light and camera at the end (cystoscope). The cystoscope may be hard (rigid) or flexible, depending on the goal of the procedure.The cystoscope is inserted through the urethra, into the bladder. Cystoscopy may be recommended if you have:  Urinary tractinfections that keep coming back (recurring).  Blood in the urine (hematuria).  Loss of bladder control (urinary incontinence) or an overactive bladder.  Unusual cells found in a urine sample.  A blockage in the urethra.  Painful urination.  An abnormality in the bladder found during an intravenous pyelogram (IVP) or CT scan.  Cystoscopy may also be done to remove a sample of tissue to be examined under a microscope (biopsy). Tell a health care provider about:  Any allergies you have.  All medicines you are taking, including vitamins, herbs, eye drops, creams, and over-the-counter medicines.  Any problems you or family members have had with anesthetic medicines.  Any blood disorders you have.  Any surgeries you have had.  Any medical conditions you have.  Whether you are pregnant or may be pregnant. What are the risks? Generally, this is a safe procedure. However, problems may occur, including:  Infection.  Bleeding.  Allergic reactions to medicines.  Damage to other structures or organs.  What happens before the procedure?  Ask your health care provider about: ? Changing or stopping your regular medicines. This is especially important if you are taking diabetes medicines or blood thinners. ? Taking medicines such as aspirin and ibuprofen. These medicines can thin your blood. Do not take these medicines  before your procedure if your health care provider instructs you not to.  Follow instructions from your health care provider about eating or drinking restrictions.  You may be given antibiotic medicine to help prevent infection.  You may have an exam or testing, such as X-rays of the bladder, urethra, or kidneys.  You may have urine tests to check for signs of infection.  Plan to have someone take you home after the procedure. What happens during the procedure?  To reduce your risk of infection,your health care team will wash or sanitize their hands.  You will be given one or more of the following: ? A medicine to help you relax (sedative). ? A medicine to numb the area (local anesthetic).  The area around the opening of your urethra will be cleaned.  The cystoscope will be passed through your urethra into your bladder.  Germ-free (sterile)fluid will flow through the cystoscope to fill your bladder. The fluid will stretch your bladder so that your surgeon can clearly examine your bladder walls.  The cystoscope will be removed and your bladder will be emptied. The procedure may vary among health care providers and hospitals. What happens after the procedure?  You may have some soreness or pain in your abdomen and urethra. Medicines will be available to help you.  You may have some blood in your urine.  Do not drive for 24 hours if you received a sedative. This information is not intended to replace advice given to you by your health care provider. Make sure you discuss any questions you have with your health care provider. Document Released: 02/16/2000 Document Revised: 06/29/2015 Document Reviewed: 01/05/2015 Elsevier Interactive   2017 [employer]  

## 2016-04-17 NOTE — Progress Notes (Signed)
04/17/2016 1:52 PM   Lauren Mccann [birth year] [id number]  Referring provider: Glendon Axe, MD [facility] [place], [place] [address]  Chief Complaint  Patient presents with  . New Patient (Initial Visit)    microscopic hematuria referred by Dr. Candiss Norse    HPI: Patient is a 83 year old Caucasian female who presents today as a referral from their PCP, Dr. Candiss Norse, for microscopic hematuria.    Patient was found to have microscopic hematuria on several occassions with some instances associated with negative cultures and some instances with positive cultures.  She is confused about why she is seeing [place] today because she has not seen blood in her urine.     Patient doesn't have a prior history of microscopic hematuria.    She have a prior history of recurrent urinary tract infections, but she denies nephrolithiasis, trauma to the genitourinary tract or malignancies of the genitourinary tract.   She does not have a family medical history of nephrolithiasis, malignancies of the genitourinary tract or hematuria.   Today, she is having symptoms of frequent urination x 6-7, urgency, dysuria, nocturia x 1 and is soaked, incontinence x 6, hesitancy, intermittency.  She had been on a medication for her urinary symptoms, but it did not do any good.  Her UA today demonstrates 11-30 WBC's.  She is not experiencing any suprapubic pain, abdominal pain or flank pain.  She denies any recent fevers, chills, nausea or vomiting.   She underwent a CT abdomin and pelvic w/wo on 03/05/2016 and it demonstrated both kidneys are excreting contrast material on the precontrast imaging suggesting that a small volume of intravenous contrast was inadvertently administered prior to scanning. This limits assessment for small renal stones.  Well-defined low-density lesions in the cortex of each kidney are likely cysts. Many are too small to definitively character is. The largest lesion is in  the interpolar left kidney measuring 12 mm compared to 15 mm previously. Although attenuation in this lesion is higher than would be expected for a simple cyst, the relative stability over 3 years suggest benign etiology.  Delayed imaging shows no wall thickening or filling defect in either intrarenal collecting system or renal pelvis. Left ureter is well opacified and has normal imaging features. Right ureter is intermittently opacified but shows no focal hydroureter, wall thickening, or mass lesion.  Bladder wall is irregular and trabeculated without a discrete mass lesion.   I have independently reviewed the films.    She is not a smoker.   She is not exposed to secondhand smoke.  She has not worked with [employer].    PMH: Past Medical History:  Diagnosis Date  . A-fib ([place])   . Anemia   . Anxiety   . Breast cancer ([place]) 03/11/2014   Overview:  S/p right mastectomy.  Reoccurrence s/p left matstectomy.  . Cancer ([place])    breast  . Closed fracture of neck of left femur with routine healing 08/07/2014  . Depression   . Fibromyalgia   . GERD (gastroesophageal reflux disease)   . Hip fracture requiring operative repair ([place]) 07/04/2014  . Pulmonary embolism ([place])   . Thyroid disease     Surgical History: Past Surgical History:  Procedure Laterality Date  . ABDOMINAL HYSTERECTOMY    . CHOLECYSTECTOMY    . COLON SURGERY    . HIP PINNING,CANNULATED Left 07/03/2014   Procedure: CANNULATED HIP PINNING;  Surgeon: Claud Kelp, MD;  Location: [facility] ORS;  Service:  Orthopedics;  Laterality: Left;  Marland Kitchen MASTECTOMY Bilateral     Home Medications:  Allergies as of 04/17/2016      Reactions   Codeine Shortness Of Breath, Other (See Comments)   GI Upset  Pt states "makes heart flutter and trouble breathing" Other reaction(s): Other (See Comments) Chest Tightness, SOB, Tachycardia GI Upset  Pt states "makes heart flutter and trouble breathing" Other reaction(s): Other (See Comments) Chest  Tightness, SOB, Tachycardia GI Upset  Pt states "makes heart flutter and trouble breathing"   Propoxyphene Shortness Of Breath   Other reaction(s): Other (See Comments) GI Upset   Alendronate Sodium    Other reaction(s): Other (See Comments) GI Upset   Atorvastatin    Other reaction(s): Other (See Comments) Myalgia   Benadryl [diphenhydramine Hcl] Other (See Comments)   Restless legs   Cefuroxime Axetil Nausea Only   Cymbalta [duloxetine Hcl]    Levofloxacin Other (See Comments)   Altered mental status   Prednisone    Other reaction(s): Other (See Comments) Chest Pain, SOB, "can't breathe good" Unable to sleep      Medication List       Accurate as of 04/17/16  1:52 PM. Always use your most recent med list.          acetaminophen 500 MG tablet Commonly known as:  TYLENOL Take 2 tablets (1,000 mg total) by mouth every 8 (eight) hours as needed for headache.   ALPRAZolam 1 MG tablet Commonly known as:  XANAX Take half tablet twice daily as needed.   ASPERCREME W/LIDOCAINE 4 % cream Generic drug:  lidocaine Apply 1 application topically as needed.   baclofen 10 MG tablet Commonly known as:  LIORESAL   diltiazem 120 MG 24 hr capsule Commonly known as:  CARDIZEM CD TAKE ONE CAPSULE DAILY   gabapentin 100 MG capsule Commonly known as:  NEURONTIN Take 2 capsules by mouth daily.   levocetirizine 5 MG tablet Commonly known as:  XYZAL Take by mouth.   levothyroxine 25 MCG tablet Commonly known as:  SYNTHROID, LEVOTHROID TAKE ONE TABLET DAILY ON EMPTY STOMACH WITH GLASS OF WATER AT LEAST 30-60 MINUTES BEFORE BREAKFAST   loperamide 2 MG capsule Commonly known as:  IMODIUM Take by mouth daily.   MULTI-VITAMIN DAILY PO Take by mouth daily.   ondansetron 4 MG tablet Commonly known as:  ZOFRAN Take 1 tablet by mouth. Every 4 to 6 hours as needed for nausea, vomiting   oxybutynin 10 MG 24 hr tablet Commonly known as:  DITROPAN-XL Take by mouth.     prochlorperazine 5 MG tablet Commonly known as:  COMPAZINE Take 1 tablet (5 mg total) by mouth every 6 (six) hours as needed (headache).   rivaroxaban 20 MG Tabs tablet Commonly known as:  XARELTO Take 1 tablet (20 mg total) by mouth daily.   sertraline 100 MG tablet Commonly known as:  ZOLOFT Take 1 tablet by mouth daily.   tiZANidine 2 MG tablet Commonly known as:  ZANAFLEX   vitamin B-12 1000 MCG tablet Commonly known as:  CYANOCOBALAMIN Take by mouth.       Allergies:  Allergies  Allergen Reactions  . Codeine Shortness Of Breath and Other (See Comments)    GI Upset  Pt states "makes heart flutter and trouble breathing" Other reaction(s): Other (See Comments) Chest Tightness, SOB, Tachycardia GI Upset  Pt states "makes heart flutter and trouble breathing" Other reaction(s): Other (See Comments) Chest Tightness, SOB, Tachycardia GI Upset  Pt states "makes heart flutter  and trouble breathing"  . Propoxyphene Shortness Of Breath    Other reaction(s): Other (See Comments) GI Upset  . Alendronate Sodium     Other reaction(s): Other (See Comments) GI Upset  . Atorvastatin     Other reaction(s): Other (See Comments) Myalgia  . Benadryl [Diphenhydramine Hcl] Other (See Comments)    Restless legs  . Cefuroxime Axetil Nausea Only  . Cymbalta [Duloxetine Hcl]   . Levofloxacin Other (See Comments)    Altered mental status  . Prednisone     Other reaction(s): Other (See Comments) Chest Pain, SOB, "can't breathe good" Unable to sleep    Family History: Family History  Problem Relation Age of Onset  . Cancer Father     Bone  . Kidney cancer Neg Hx   . Prostate cancer Neg Hx   . Bladder Cancer Neg Hx     Social History:  reports that she has never smoked. She has never used smokeless tobacco. She reports that she does not drink alcohol or use drugs.  ROS: UROLOGY Frequent Urination?: Yes Hard to postpone urination?: Yes Burning/pain with urination?:  Yes Get up at night to urinate?: Yes Leakage of urine?: Yes Urine stream starts and stops?: Yes Trouble starting stream?: No Do you have to strain to urinate?: No Blood in urine?: No Urinary tract infection?: No Sexually transmitted disease?: No Injury to kidneys or bladder?: No Painful intercourse?: No Weak stream?: No Currently pregnant?: No Vaginal bleeding?: No Last menstrual period?: n  Gastrointestinal Nausea?: Yes Vomiting?: No Indigestion/heartburn?: Yes Diarrhea?: Yes Constipation?: No  Constitutional Fever: No Night sweats?: No Weight loss?: No Fatigue?: Yes  Skin Skin rash/lesions?: No Itching?: No  Eyes Blurred vision?: No Double vision?: No  Ears/Nose/Throat Sore throat?: No Sinus problems?: Yes  Hematologic/Lymphatic Swollen glands?: No Easy bruising?: Yes  Cardiovascular Leg swelling?: No Chest pain?: No  Respiratory Cough?: No Shortness of breath?: Yes  Endocrine Excessive thirst?: No  Musculoskeletal Back pain?: Yes Joint pain?: No  Neurological Headaches?: Yes Dizziness?: Yes  Psychologic Depression?: No Anxiety?: No  Physical Exam: BP 126/75   Pulse 99   Ht 5\' 2"  (1.575 m)   Wt 133 lb 14.4 oz (60.7 kg)   BMI 24.49 kg/m   Constitutional: Well nourished. Alert and oriented, No acute distress. HEENT: [place] AT, moist mucus membranes. Trachea midline, no masses. Cardiovascular: No clubbing, cyanosis, or edema. Respiratory: Normal respiratory effort, no increased work of breathing. GI: Abdomen is soft, non tender, non distended, no abdominal masses. Liver and spleen not palpable.  No hernias appreciated.  Stool sample for occult testing is not indicated.   GU: No CVA tenderness.  No bladder fullness or masses.   Skin: No rashes, bruises or suspicious lesions. Lymph: No cervical or inguinal adenopathy. Neurologic: Grossly intact, no focal deficits, moving all 4 extremities. Psychiatric: Normal mood and  affect.  Laboratory Data: Lab Results  Component Value Date   WBC 9.4 03/27/2015   HGB 13.1 03/27/2015   HCT 39.3 03/27/2015   MCV 90.7 03/27/2015   PLT 195 03/27/2015    Lab Results  Component Value Date   CREATININE 1.00 03/05/2016    Lab Results  Component Value Date   TSH 2.808 07/19/2014    Lab Results  Component Value Date   AST 22 02/08/2015   Lab Results  Component Value Date   ALT 17 02/08/2015     Urinalysis 11-30 WBC's.  See EPIC.    Pertinent Imaging: CLINICAL DATA:  Hematuria with lower  abdominal pain. Prior hysterectomy, appendectomy and cholecystectomy.  EXAM: CT ABDOMEN AND PELVIS WITHOUT AND WITH CONTRAST  TECHNIQUE: Multidetector CT imaging of the abdomen and pelvis was performed following the standard protocol before and following the bolus administration of intravenous contrast.  CONTRAST:  125 cc Isovue-300  COMPARISON:  03/10/2013  FINDINGS: Lower chest:  Moderate hiatal hernia.  Hepatobiliary: No focal abnormality within the liver parenchyma. Gallbladder surgically absent. No intrahepatic or extrahepatic biliary dilation.  Pancreas: No focal mass lesion. No dilatation of the main duct. No intraparenchymal cyst. No peripancreatic edema.  Spleen: No splenomegaly. No focal mass lesion.  Adrenals/Urinary Tract: Mild bilateral adrenal thickening noted without nodule.  Both kidneys are excreting contrast material on the precontrast imaging suggesting that a small volume of intravenous contrast was inadvertently administered prior to scanning. This limits assessment for small renal stones.  Well-defined low-density lesions in the cortex of each kidney are likely cysts. Many are too small to definitively character is. The largest lesion is in the interpolar left kidney measuring 12 mm compared to 15 mm previously. Although attenuation in this lesion is higher than would be expected for a simple cyst, the  relative stability over 3 years suggest benign etiology.  Delayed imaging shows no wall thickening or filling defect in either intrarenal collecting system or renal pelvis. Left ureter is well opacified and has normal imaging features. Right ureter is intermittently opacified but shows no focal hydroureter, wall thickening, or mass lesion.  Bladder wall is irregular and trabeculated without a discrete mass lesion.  Stomach/Bowel: Moderate hiatal hernia. Stomach otherwise unremarkable. Duodenum is normally positioned as is the ligament of Treitz. No small bowel wall thickening. No small bowel dilatation. Status post right hemicolectomy. Diverticular changes are noted in the left colon without evidence of diverticulitis.  Vascular/Lymphatic: There is abdominal aortic atherosclerosis without aneurysm. IVC filter identified in situ. The portal vein and superior mesenteric vein are patent. There is no gastrohepatic or hepatoduodenal ligament lymphadenopathy. No intraperitoneal or retroperitoneal lymphadenopathy. No pelvic sidewall lymphadenopathy.  Reproductive: Uterus is surgically absent. There is no adnexal mass.  Other: No intraperitoneal free fluid.  Musculoskeletal: Status post pin placement left hip. Bones are diffusely demineralized.  IMPRESSION: 1. No specific features to explain the patient's history of hematuria. Early injection of contrast material precludes assessment for tiny bilateral renal stones. No enhancing mass or urothelial abnormality is evident by CT. 2. Moderate hiatal hernia. 3.  Abdominal Aortic Atherosclerois (ICD10-170.0)   Electronically Signed   By: Misty Stanley M.D.   On: 03/05/2016 15:35  Assessment & Plan:    1. Microscopic hematuria  - I explained to the patient that there are a number of causes that can be associated with blood in the urine, such as stones, UTI's, damage to the urinary tract and/or cancer.  - Completed CT w/wo  contrast - reviewed the results with the patient - due to the intermittent opacification of the right ureter it is possible to miss some urological tumors, but this is a small risk - do not recommend cysto with RTG's at this time due to age and co morbidities  - Her reproductive status is hysterectomy  - I've recommended a cystoscopy. I described how this is performed, typically in an office setting with a flexible cystoscope. We described the risks, benefits, and possible side effects, the most common of which is a minor amount of blood in the urine and/or burning which usually resolves in 24 to 48 hours.    -  The patient had the opportunity to ask questions which were answered. Based upon this discussion, the patient is willing to proceed. Therefore, I've ordered: a cystoscopy.  - UA  - Urine culture   2. Renal lesions  - most likely benign renal cysts  3. Bladder wall irregularity  - seen on CT scan  - scheduled for cystoscopy  4. OAB  - will reassess once cystoscopy is completed and malignancy is ruled out  Return for cystoscopy.  These notes generated with voice recognition software. I apologize for typographical errors.  Zara Council, [facility] [address], [place], [place] [address] [phone]

## 2016-04-19 LAB — BUN+CREAT
BUN / CREAT RATIO: 15 (ref 12–28)
BUN: 16 mg/dL (ref 8–27)
CREATININE: 1.07 mg/dL — AB (ref 0.57–1.00)
GFR calc Af Amer: 55 mL/min/{1.73_m2} — ABNORMAL LOW (ref >59)
GFR, EST NON AFRICAN AMERICAN: 48 mL/min/{1.73_m2} — AB (ref >59)

## 2016-04-21 LAB — CULTURE, URINE COMPREHENSIVE

## 2016-05-08 ENCOUNTER — Other Ambulatory Visit: Payer: Medicare Other | Admitting: Urology

## 2016-06-14 ENCOUNTER — Other Ambulatory Visit: Payer: Medicare Other | Admitting: Urology

## 2016-07-16 ENCOUNTER — Ambulatory Visit: Payer: Medicare Other | Admitting: Hematology and Oncology

## 2016-07-23 ENCOUNTER — Inpatient Hospital Stay: Payer: Medicare Other

## 2016-07-23 ENCOUNTER — Inpatient Hospital Stay: Payer: Medicare Other | Attending: Hematology and Oncology | Admitting: Hematology and Oncology

## 2016-07-23 VITALS — BP 115/67 | HR 51 | Temp 98.1°F | Resp 20 | Wt 131.7 lb

## 2016-07-23 DIAGNOSIS — R197 Diarrhea, unspecified: Secondary | ICD-10-CM | POA: Diagnosis not present

## 2016-07-23 DIAGNOSIS — R112 Nausea with vomiting, unspecified: Secondary | ICD-10-CM | POA: Insufficient documentation

## 2016-07-23 DIAGNOSIS — Z79899 Other long term (current) drug therapy: Secondary | ICD-10-CM | POA: Diagnosis not present

## 2016-07-23 DIAGNOSIS — M797 Fibromyalgia: Secondary | ICD-10-CM | POA: Insufficient documentation

## 2016-07-23 DIAGNOSIS — K529 Noninfective gastroenteritis and colitis, unspecified: Secondary | ICD-10-CM | POA: Diagnosis not present

## 2016-07-23 DIAGNOSIS — E340 Carcinoid syndrome: Secondary | ICD-10-CM | POA: Diagnosis not present

## 2016-07-23 DIAGNOSIS — D509 Iron deficiency anemia, unspecified: Secondary | ICD-10-CM | POA: Insufficient documentation

## 2016-07-23 DIAGNOSIS — Z9013 Acquired absence of bilateral breasts and nipples: Secondary | ICD-10-CM | POA: Diagnosis not present

## 2016-07-23 DIAGNOSIS — Z7901 Long term (current) use of anticoagulants: Secondary | ICD-10-CM | POA: Diagnosis not present

## 2016-07-23 DIAGNOSIS — C779 Secondary and unspecified malignant neoplasm of lymph node, unspecified: Secondary | ICD-10-CM | POA: Diagnosis not present

## 2016-07-23 DIAGNOSIS — R634 Abnormal weight loss: Secondary | ICD-10-CM

## 2016-07-23 DIAGNOSIS — Z853 Personal history of malignant neoplasm of breast: Secondary | ICD-10-CM | POA: Insufficient documentation

## 2016-07-23 DIAGNOSIS — D3A8 Other benign neuroendocrine tumors: Secondary | ICD-10-CM

## 2016-07-23 DIAGNOSIS — Z9049 Acquired absence of other specified parts of digestive tract: Secondary | ICD-10-CM | POA: Diagnosis not present

## 2016-07-23 DIAGNOSIS — K219 Gastro-esophageal reflux disease without esophagitis: Secondary | ICD-10-CM | POA: Diagnosis not present

## 2016-07-23 DIAGNOSIS — C7A8 Other malignant neuroendocrine tumors: Secondary | ICD-10-CM

## 2016-07-23 DIAGNOSIS — Z86711 Personal history of pulmonary embolism: Secondary | ICD-10-CM | POA: Diagnosis not present

## 2016-07-23 DIAGNOSIS — I4891 Unspecified atrial fibrillation: Secondary | ICD-10-CM | POA: Diagnosis not present

## 2016-07-23 DIAGNOSIS — R630 Anorexia: Secondary | ICD-10-CM | POA: Diagnosis not present

## 2016-07-23 DIAGNOSIS — E079 Disorder of thyroid, unspecified: Secondary | ICD-10-CM | POA: Diagnosis not present

## 2016-07-23 LAB — COMPREHENSIVE METABOLIC PANEL
ALT: 15 U/L (ref 14–54)
AST: 22 U/L (ref 15–41)
Albumin: 4.3 g/dL (ref 3.5–5.0)
Alkaline Phosphatase: 38 U/L (ref 38–126)
Anion gap: 7 (ref 5–15)
BUN: 22 mg/dL — ABNORMAL HIGH (ref 6–20)
CO2: 27 mmol/L (ref 22–32)
Calcium: 9.4 mg/dL (ref 8.9–10.3)
Chloride: 101 mmol/L (ref 101–111)
Creatinine, Ser: 0.9 mg/dL (ref 0.44–1.00)
GFR calc Af Amer: >60 mL/min (ref >60)
GFR calc non Af Amer: 58 mL/min — ABNORMAL LOW (ref >60)
Glucose, Bld: 99 mg/dL (ref 65–99)
Potassium: 3.8 mmol/L (ref 3.5–5.1)
Sodium: 135 mmol/L (ref 135–145)
Total Bilirubin: 0.7 mg/dL (ref 0.3–1.2)
Total Protein: 8 g/dL (ref 6.5–8.1)

## 2016-07-23 LAB — CBC WITH DIFFERENTIAL/PLATELET
Basophils Absolute: 0 10*3/uL (ref 0–0.1)
Basophils Relative: 1 %
Eosinophils Absolute: 0.1 10*3/uL (ref 0–0.7)
Eosinophils Relative: 2 %
HCT: 37.7 % (ref 35.0–47.0)
Hemoglobin: 12.9 g/dL (ref 12.0–16.0)
Lymphocytes Relative: 24 %
Lymphs Abs: 2.1 10*3/uL (ref 1.0–3.6)
MCH: 31.1 pg (ref 26.0–34.0)
MCHC: 34.2 g/dL (ref 32.0–36.0)
MCV: 90.9 fL (ref 80.0–100.0)
Monocytes Absolute: 1.1 10*3/uL — ABNORMAL HIGH (ref 0.2–0.9)
Monocytes Relative: 13 %
Neutro Abs: 5.2 10*3/uL (ref 1.4–6.5)
Neutrophils Relative %: 60 %
Platelets: 233 10*3/uL (ref 150–440)
RBC: 4.14 MIL/uL (ref 3.80–5.20)
RDW: 15.1 % — ABNORMAL HIGH (ref 11.5–14.5)
WBC: 8.6 10*3/uL (ref 3.6–11.0)

## 2016-07-23 NOTE — Progress Notes (Signed)
[facility] day:  07/23/2016  Chief Complaint: Lauren Mccann is a 83 y.o. female with a history of neuroendocrine tumor who is seen for reassessment.   HPI:  The patient has a history of bilateral breast cancer.  She right breast cancer 20+ years ago. She underwent lumpectomy.  Left breast cancer was diagnosed 7-8 years ago. She never received radiation or chemotherapy. No details are available.  The patient was admitted to [facility] on 11/06/2011 with bilateral pulmonary emboli.  Clot was extensive with a saddle emboli on the right extending from the main right-sided pulmonary artery into three right-sided branches along with smaller left-sided pulmonary emboli.  She was treated with Lovenox.  Lower extremity duplex revealed occlusive thrombus in the right popliteal vein and near occlusive thrombus in the proximal superficial femoral vein.  An IVC filter was placed.  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  She remains on Xarelto.  The patient was admitted to [facility] in 10/2012 with a hemoglobin of 6. She was diagnosed with iron deficiency anemia. Colonoscopy on 09/28/2012 revealed a large mass in the cecum. Pathology revealed a villous adenoma with high grade dysplasia. Upper endoscopy was normal.  She underwent right hemicolectomy on 10/29/2012 by Dr Rochel Brome.  Pathology revealed a 2.0 cm grade I neuroendocrine tumor arising in the distal ileum.  Tumor extended through the muscularis propria into the adjacent adipose tissue.  Margins were negative.  There was lymph-vascular invasion.  Five of 14 lymph nodes were positive.  Pathologic stage was T3N1.  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  Postoperatively she was admitted with a fever and abscess on 11/09/2012. She underwent CT-guided drainage. She was readmitted on 11/18/2016 with a transient small bowel obstruction. She responded  to conservative management.  She was last seen in the medical oncology clinic on 02/08/2015 by Dr. Oliva Bustard.  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  She received Sandostatin LAR 30 mg every 4 weeks.  She states that she received injections monthly for about 2 years.  She states that she has not followed up as she has had "so many other health problems". She had a hip fracture about 2 years ago. She has chronic issues with diarrhea. She wears pull-ups 24/7. Stools are watery. She uses Imodium.  She has been followed by Dr. Gaylyn Cheers and Mason Jim, [place] in the [facility] She was last seen on 07/02/2016. At that time she had chronic diarrhea reporting 2-8 loose and watery bowel movements a day.  At times, stool looked like "brown third". She had urgency after meals. She denied any nighttime bowel movements. There were no dietary triggers. Stool studies were normal. Because of the lack of long-term improvement with cholestyramine and Lomotil in the past, carcinoid syndrome was suspected.  Symptomatically she notes a poor appetite. Weight is down 23 pounds since her surgery.  She denies any fevers or sweats. She has chronic shortness of breath. She denies any melena or hematochezia, but notes her stools are dark secondary to oral iron.  She has occasional nausea and vomiting secondary to diarrhea.   Past Medical History:  Diagnosis Date  . A-fib ([place])   . Anemia   . Anxiety   . Breast cancer ([place]) 03/11/2014   Overview:  S/p right mastectomy.  Reoccurrence s/p left matstectomy.  . Cancer ([place])    breast  .  Closed fracture of neck of left femur with routine healing 08/07/2014  . Depression   . Fibromyalgia   . GERD (gastroesophageal reflux disease)   . Hip fracture requiring operative repair ([place]) 07/04/2014  . Pulmonary embolism ([place])   . Thyroid disease     Past Surgical History:  Procedure Laterality Date  . ABDOMINAL HYSTERECTOMY    .  CHOLECYSTECTOMY    . COLON SURGERY    . HIP PINNING,CANNULATED Left 07/03/2014   Procedure: CANNULATED HIP PINNING;  Surgeon: Claud Kelp, MD;  Location: [facility] ORS;  Service: Orthopedics;  Laterality: Left;  Marland Kitchen MASTECTOMY Bilateral     Family History  Problem Relation Age of Onset  . Cancer Father        Bone  . Kidney cancer Neg Hx   . Prostate cancer Neg Hx   . Bladder Cancer Neg Hx     Social History:  reports that she has never smoked. She has never used smokeless tobacco. She reports that she does not drink alcohol or use drugs.  Two sons died (one died at the age of 86 with AIDS another son died at age 44 of melanoma).  The patient lives in the [place] apartment complex.  Her friend, Joycelyn Schmid, lives upstairs.  The patient is accompanied by her friend, Joycelyn Schmid, today.  Allergies:  Allergies  Allergen Reactions  . Codeine Shortness Of Breath and Other (See Comments)    GI Upset  Pt states "makes heart flutter and trouble breathing" Other reaction(s): Other (See Comments) Chest Tightness, SOB, Tachycardia GI Upset  Pt states "makes heart flutter and trouble breathing" Other reaction(s): Other (See Comments) Chest Tightness, SOB, Tachycardia GI Upset  Pt states "makes heart flutter and trouble breathing"  . Propoxyphene Shortness Of Breath    Other reaction(s): Other (See Comments) GI Upset  . Alendronate Sodium     Other reaction(s): Other (See Comments) GI Upset  . Atorvastatin     Other reaction(s): Other (See Comments) Myalgia  . Benadryl [Diphenhydramine Hcl] Other (See Comments)    Restless legs  . Cefuroxime Axetil Nausea Only  . Cymbalta [Duloxetine Hcl]   . Prednisone     Other reaction(s): Other (See Comments) Chest Pain, SOB, "can't breathe good" Unable to sleep  . Levofloxacin Rash and Other (See Comments)    Altered mental status    Current Medications: Current Outpatient Prescriptions  Medication Sig Dispense Refill  . acetaminophen (TYLENOL) 500  MG tablet Take 2 tablets (1,000 mg total) by mouth every 8 (eight) hours as needed for headache. 18 tablet 0  . ALPRAZolam (XANAX) 1 MG tablet Take half tablet twice daily as needed.    . baclofen (LIORESAL) 10 MG tablet     . diltiazem (CARDIZEM CD) 120 MG 24 hr capsule TAKE ONE CAPSULE DAILY    . levocetirizine (XYZAL) 5 MG tablet Take by mouth.    . levothyroxine (SYNTHROID, LEVOTHROID) 25 MCG tablet TAKE ONE TABLET DAILY ON EMPTY STOMACH WITH GLASS OF WATER AT LEAST 30-60 MINUTES BEFORE BREAKFAST    . lidocaine (ASPERCREME W/LIDOCAINE) 4 % cream Apply 1 application topically as needed.    . loperamide (IMODIUM) 2 MG capsule Take by mouth daily.    . Multiple Vitamin (MULTI-VITAMIN DAILY PO) Take by mouth daily.    . ondansetron (ZOFRAN) 4 MG tablet Take 1 tablet by mouth. Every 4 to 6 hours as needed for nausea, vomiting    . rivaroxaban (XARELTO) 20 MG TABS tablet Take 1 tablet (20  mg total) by mouth daily. 30 tablet 1  . sertraline (ZOLOFT) 100 MG tablet Take 1 tablet by mouth daily.    Marland Kitchen tiZANidine (ZANAFLEX) 2 MG tablet     . vitamin B-12 (CYANOCOBALAMIN) 1000 MCG tablet Take by mouth.    . gabapentin (NEURONTIN) 100 MG capsule Take 2 capsules by mouth daily.     Marland Kitchen oxybutynin (DITROPAN-XL) 10 MG 24 hr tablet Take by mouth.     No current facility-administered medications for this visit.     Review of Systems:  GENERAL:  Feels "ok".  No fevers or sweats.  Weight loss of 23 pounds since surgery. PERFORMANCE STATUS (ECOG):  1-2 HEENT:  Ticking sound in right ear.  No visual changes, runny nose, sore throat, mouth sores or tenderness. Lungs:  Chronic shortness of breath.  No cough.  No hemoptysis. Cardiac:  No chest pain, palpitations, orthopnea, or PND. GI:  Poor appetite.  Diarrhea (see HPI).  Occasional nausea and vomiting.  No constipation, melena or hematochezia. GU:  No urgency, frequency, dysuria, or hematuria. Musculoskeletal:  Fibromyalgia.  No back pain.  No joint pain.  No  muscle tenderness. Extremities:  No pain or swelling. Skin:  No rashes or skin changes. Neuro:  No headache, numbness or weakness, balance or coordination issues. Endocrine:  No diabetes.  Thyroid disease on Levothyroxine.  No hot flashes or night sweats. Psych:  No mood changes, depression or anxiety. Pain:  No focal pain. Review of systems:  All other systems reviewed and found to be negative.  Physical Exam: Blood pressure 115/67, pulse (!) 51, temperature 98.1 F (36.7 C), temperature source Oral, resp. rate 20, weight 131 lb 11.2 oz (59.7 kg). GENERAL:  Well developed, well nourished, woman sitting comfortably in the exam room in no acute distress. MENTAL STATUS:  Alert and oriented to person, place and time. HEAD:  Short blonde hair.  Normocephalic, atraumatic, face symmetric, no Cushingoid features. EYES:  Pupils equal round and reactive to light and accomodation.  No conjunctivitis or scleral icterus. ENT:  Oropharynx clear without lesion.  Tongue normal. Mucous membranes moist.  RESPIRATORY:  Clear to auscultation without rales, wheezes or rhonchi. CARDIOVASCULAR:  Regular rate and rhythm without murmur, rub or gallop. ABDOMEN:  Soft, non-tender, with active bowel sounds, and no hepatosplenomegaly.  No masses. SKIN:  No rashes, ulcers or lesions. EXTREMITIES: No edema, no skin discoloration or tenderness.  No palpable cords. LYMPH NODES: No palpable cervical, supraclavicular, axillary or inguinal adenopathy  NEUROLOGICAL: Unremarkable. PSYCH:  Appropriate.   Office Visit on 07/23/2016  Component Date Value Ref Range Status  . WBC 07/23/2016 8.6  3.6 - 11.0 K/uL Final  . RBC 07/23/2016 4.14  3.80 - 5.20 MIL/uL Final  . Hemoglobin 07/23/2016 12.9  12.0 - 16.0 g/dL Final  . HCT 07/23/2016 37.7  35.0 - 47.0 % Final  . MCV 07/23/2016 90.9  80.0 - 100.0 fL Final  . MCH 07/23/2016 31.1  26.0 - 34.0 pg Final  . MCHC 07/23/2016 34.2  32.0 - 36.0 g/dL Final  . RDW 07/23/2016 15.1*  11.5 - 14.5 % Final  . Platelets 07/23/2016 233  150 - 440 K/uL Final  . Neutrophils Relative % 07/23/2016 60  % Final  . Neutro Abs 07/23/2016 5.2  1.4 - 6.5 K/uL Final  . Lymphocytes Relative 07/23/2016 24  % Final  . Lymphs Abs 07/23/2016 2.1  1.0 - 3.6 K/uL Final  . Monocytes Relative 07/23/2016 13  % Final  . Monocytes Absolute  07/23/2016 1.1* 0.2 - 0.9 K/uL Final  . Eosinophils Relative 07/23/2016 2  % Final  . Eosinophils Absolute 07/23/2016 0.1  0 - 0.7 K/uL Final  . Basophils Relative 07/23/2016 1  % Final  . Basophils Absolute 07/23/2016 0.0  0 - 0.1 K/uL Final  . Sodium 07/23/2016 135  135 - 145 mmol/L Final  . Potassium 07/23/2016 3.8  3.5 - 5.1 mmol/L Final  . Chloride 07/23/2016 101  101 - 111 mmol/L Final  . CO2 07/23/2016 27  22 - 32 mmol/L Final  . Glucose, Bld 07/23/2016 99  65 - 99 mg/dL Final  . BUN 07/23/2016 22* 6 - 20 mg/dL Final  . Creatinine, Ser 07/23/2016 0.90  0.44 - 1.00 mg/dL Final  . Calcium 07/23/2016 9.4  8.9 - 10.3 mg/dL Final  . Total Protein 07/23/2016 8.0  6.5 - 8.1 g/dL Final  . Albumin 07/23/2016 4.3  3.5 - 5.0 g/dL Final  . AST 07/23/2016 22  15 - 41 U/L Final  . ALT 07/23/2016 15  14 - 54 U/L Final  . Alkaline Phosphatase 07/23/2016 38  38 - 126 U/L Final  . Total Bilirubin 07/23/2016 0.7  0.3 - 1.2 mg/dL Final  . GFR calc non Af Amer 07/23/2016 58* >60 mL/min Final  . GFR calc Af Amer 07/23/2016 >60  >60 mL/min Final   Comment: (NOTE) The eGFR has been calculated using the CKD EPI equation. This calculation has not been validated in all clinical situations. eGFR's persistently <60 mL/min signify possible Chronic Kidney Disease.   . Anion gap 07/23/2016 7  5 - 15 Final  . Chromogranin A 07/23/2016 3  0 - 5 nmol/L Final   Comment: (NOTE) Chromogranin A performed by [employer] methodology. Results for this test are designated to be for research purposes only by the assay's manufacturer. The performance characteristics of this  product have not been established. Results for this test should not be used as absolute evidence of presence or absence of malignant disease without confirmation of the diagnosis by another medically established diagnostic product or procedure. Values obtained with different assay methods or kits cannot be used interchangeably. Performed At: [facility] [place], [place] [id number] Lindon Romp MD UR:[id number]     Assessment:  SHERRAN MARGOLIS is a 83 y.o. female with a neuroendocrine tumor s/p right hemicolectomy on 10/29/2012.  Pathology revealed a 2.0 cm grade I neuroendocrine tumor arising in the distal ileum.  Tumor extended through the muscularis propria into the adjacent adipose tissue.  Margins were negative.  There was lymph-vascular invasion.  Five of 14 lymph nodes were positive.  Pathologic stage was T3N1.  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  She received Sandostatin LAR 30 mg every 4 weeks.  She received injections monthly for about 2 years.  She was diagnosed with extensive bilateral pulmonary emboli on 11/06/2011.  There was a saddle embolus.  There was a large pulmonary emboli within the right main pulmonary artery extending into the segmental branches of the right upper, right lower and right middle lobe.  There were pulmonary emboli in the left upper lobe and peripheral branches of the left lower lobe artery segements.  She was treated with Lovenox and discharged on Xarelto.  Lower extremity duplex revealed occlusive thrombus in the right popliteal vein and near occlusive thrombus in the proximal superficial femoral vein.  An IVC filter was  placed.  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  She remains on Xarelto.  She has a history of bilateral breast cancer.  She right breast cancer 20+ years ago. She  underwent lumpectomy.  Left breast cancer was diagnosed 7-8 years ago. She never received radiation or chemotherapy. No details are available.  Symptomatically, she has chronic diarrhea reporting 2-8 loose and watery bowel movements a day.  She had urgency after meals. There were no dietary triggers. Stool studies were normal.  She notes a poor appetite. Weight is down 23 pounds since her surgery.  She denies any fevers or sweats.   Plan: 1.  Review entire medical history including breast cancer, neuroendocrine tumor, and pulmonary embolism.  Discuss treatment of carcinoid syndrome with Sandostatin LAR or lanreotide.  Discuss checking chromogranin A and 24 hour urine for 5HIAA. 2.  Labs today:  CBC with diff, CMP, chromagranin A. 3.  24 hour urine for 5HIAA. 4.  Preauth Sandostatin LAR 20 mg very month.  Advance dose as needed to 30 mg a month (previous dose in 2016).  Consider lanreotide in future. 5.  RTC in 1 week for MD assessment and Sandostatin LAR.  Addendum:  Over 40 minutes were spent reviewing extensive medical records and talking to the patient.  More than 50% of the time was spent with the patient, discussing her numerous medical issues and ongoing problem with diarrhea.  She previously responded to Sandostatin LAR for presumed carcinoid syndrome.   Lequita Asal, MD  07/23/2016

## 2016-07-25 LAB — CHROMOGRANIN A: Chromogranin A: 3 nmol/L (ref 0–5)

## 2016-07-29 ENCOUNTER — Encounter: Payer: Self-pay | Admitting: Hematology and Oncology

## 2016-07-29 DIAGNOSIS — R197 Diarrhea, unspecified: Secondary | ICD-10-CM | POA: Insufficient documentation

## 2016-07-31 DIAGNOSIS — C7A8 Other malignant neuroendocrine tumors: Secondary | ICD-10-CM | POA: Diagnosis not present

## 2016-08-01 ENCOUNTER — Other Ambulatory Visit: Payer: Self-pay

## 2016-08-01 ENCOUNTER — Inpatient Hospital Stay: Payer: Medicare Other

## 2016-08-01 ENCOUNTER — Inpatient Hospital Stay (HOSPITAL_BASED_OUTPATIENT_CLINIC_OR_DEPARTMENT_OTHER): Payer: Medicare Other | Admitting: Hematology and Oncology

## 2016-08-01 VITALS — BP 131/83 | HR 52 | Temp 98.0°F | Resp 21 | Wt 131.9 lb

## 2016-08-01 DIAGNOSIS — D3A8 Other benign neuroendocrine tumors: Secondary | ICD-10-CM

## 2016-08-01 DIAGNOSIS — K219 Gastro-esophageal reflux disease without esophagitis: Secondary | ICD-10-CM | POA: Diagnosis not present

## 2016-08-01 DIAGNOSIS — K529 Noninfective gastroenteritis and colitis, unspecified: Secondary | ICD-10-CM

## 2016-08-01 DIAGNOSIS — Z7901 Long term (current) use of anticoagulants: Secondary | ICD-10-CM

## 2016-08-01 DIAGNOSIS — C779 Secondary and unspecified malignant neoplasm of lymph node, unspecified: Secondary | ICD-10-CM

## 2016-08-01 DIAGNOSIS — Z9049 Acquired absence of other specified parts of digestive tract: Secondary | ICD-10-CM

## 2016-08-01 DIAGNOSIS — C7A8 Other malignant neuroendocrine tumors: Secondary | ICD-10-CM

## 2016-08-01 DIAGNOSIS — D509 Iron deficiency anemia, unspecified: Secondary | ICD-10-CM

## 2016-08-01 DIAGNOSIS — Z853 Personal history of malignant neoplasm of breast: Secondary | ICD-10-CM

## 2016-08-01 DIAGNOSIS — R630 Anorexia: Secondary | ICD-10-CM

## 2016-08-01 DIAGNOSIS — I4891 Unspecified atrial fibrillation: Secondary | ICD-10-CM

## 2016-08-01 DIAGNOSIS — Z79899 Other long term (current) drug therapy: Secondary | ICD-10-CM

## 2016-08-01 DIAGNOSIS — R634 Abnormal weight loss: Secondary | ICD-10-CM

## 2016-08-01 DIAGNOSIS — E079 Disorder of thyroid, unspecified: Secondary | ICD-10-CM

## 2016-08-01 DIAGNOSIS — Z86711 Personal history of pulmonary embolism: Secondary | ICD-10-CM

## 2016-08-01 DIAGNOSIS — Z9013 Acquired absence of bilateral breasts and nipples: Secondary | ICD-10-CM

## 2016-08-01 DIAGNOSIS — R197 Diarrhea, unspecified: Secondary | ICD-10-CM

## 2016-08-01 DIAGNOSIS — M797 Fibromyalgia: Secondary | ICD-10-CM

## 2016-08-01 NOTE — Progress Notes (Signed)
[facility] day:  08/01/2016   Chief Complaint: Lauren Mccann is a 83 y.o. female with a history of neuroendocrine tumor who is seen for review of testing and discussion regarding direction of therapy.   HPI:  The patient was last seen in the medical oncology clinic on 07/23/2016.  At that time she was seen for initial assessment by me.  She had a history of neuroendocrine carcinoma in 2014.  Previously she had received Sandostatin LAR for diarrhea.  She had recently seen GI and was felt to possibly benefit from reinstitution of sandostatin.  She underwent assessment for carcinoid syndrome.  Chromogranin A was 3 (0-5).  Urine for 5HIAA is pending.  Symptomatically, she notes "nothing is different".  She continues to have diarrhea.  She states that she took an Imodium pill on Saturday.  She had a bowel movement in the evening that was "somewhat formed".  She had diarrhea on Sunday.  She took no Imodium as she "wasn't going anywhere".  She had no bowel movement yesterday.  She does not relate her diarrhea to any particular food.   Past Medical History:  Diagnosis Date  . A-fib ([place])   . Anemia   . Anxiety   . Breast cancer ([place]) 03/11/2014   Overview:  S/p right mastectomy.  Reoccurrence s/p left matstectomy.  . Cancer ([place])    breast  . Closed fracture of neck of left femur with routine healing 08/07/2014  . Depression   . Fibromyalgia   . GERD (gastroesophageal reflux disease)   . Hip fracture requiring operative repair ([place]) 07/04/2014  . Pulmonary embolism ([place])   . Thyroid disease     Past Surgical History:  Procedure Laterality Date  . ABDOMINAL HYSTERECTOMY    . CHOLECYSTECTOMY    . COLON SURGERY    . HIP PINNING,CANNULATED Left 07/03/2014   Procedure: CANNULATED HIP PINNING;  Surgeon: Claud Kelp, MD;  Location: [facility] ORS;  Service: Orthopedics;  Laterality: Left;  Marland Kitchen MASTECTOMY Bilateral     Family History  Problem Relation Age  of Onset  . Cancer Father        Bone  . Kidney cancer Neg Hx   . Prostate cancer Neg Hx   . Bladder Cancer Neg Hx     Social History:  reports that she has never smoked. She has never used smokeless tobacco. She reports that she does not drink alcohol or use drugs.  Two sons died (one died at the age of 84 with AIDS another son died at age 66 of melanoma).  The patient lives in the [place] apartment complex.  Her friend, Joycelyn Schmid, lives upstairs.  The patient is accompanied by her friend, Joycelyn Schmid, today.  Allergies:  Allergies  Allergen Reactions  . Codeine Shortness Of Breath and Other (See Comments)    GI Upset  Pt states "makes heart flutter and trouble breathing" Other reaction(s): Other (See Comments) Chest Tightness, SOB, Tachycardia GI Upset  Pt states "makes heart flutter and trouble breathing" Other reaction(s): Other (See Comments) Chest Tightness, SOB, Tachycardia GI Upset  Pt states "makes heart flutter and trouble breathing"  . Propoxyphene Shortness Of Breath    Other reaction(s): Other (See Comments) GI Upset  . Alendronate Sodium     Other reaction(s): Other (See Comments) GI Upset  . Atorvastatin     Other reaction(s): Other (See Comments) Myalgia  . Benadryl [Diphenhydramine Hcl] Other (See Comments)    Restless legs  .  Cefuroxime Axetil Nausea Only  . Cymbalta [Duloxetine Hcl]   . Prednisone     Other reaction(s): Other (See Comments) Chest Pain, SOB, "can't breathe good" Unable to sleep  . Levofloxacin Rash and Other (See Comments)    Altered mental status    Current Medications: Current Outpatient Prescriptions  Medication Sig Dispense Refill  . acetaminophen (TYLENOL) 500 MG tablet Take 2 tablets (1,000 mg total) by mouth every 8 (eight) hours as needed for headache. 18 tablet 0  . ALPRAZolam (XANAX) 1 MG tablet Take half tablet twice daily as needed.    . baclofen (LIORESAL) 10 MG tablet     . diltiazem (CARDIZEM CD) 120 MG 24 hr capsule  TAKE ONE CAPSULE DAILY    . levocetirizine (XYZAL) 5 MG tablet Take by mouth.    . levothyroxine (SYNTHROID, LEVOTHROID) 25 MCG tablet TAKE ONE TABLET DAILY ON EMPTY STOMACH WITH GLASS OF WATER AT LEAST 30-60 MINUTES BEFORE BREAKFAST    . lidocaine (ASPERCREME W/LIDOCAINE) 4 % cream Apply 1 application topically as needed.    . loperamide (IMODIUM) 2 MG capsule Take by mouth daily.    . Multiple Vitamin (MULTI-VITAMIN DAILY PO) Take by mouth daily.    . ondansetron (ZOFRAN) 4 MG tablet Take 1 tablet by mouth. Every 4 to 6 hours as needed for nausea, vomiting    . oxybutynin (DITROPAN-XL) 10 MG 24 hr tablet Take by mouth.    . rivaroxaban (XARELTO) 20 MG TABS tablet Take 1 tablet (20 mg total) by mouth daily. 30 tablet 1  . sertraline (ZOLOFT) 100 MG tablet Take 1 tablet by mouth daily.    Marland Kitchen tiZANidine (ZANAFLEX) 2 MG tablet     . vitamin B-12 (CYANOCOBALAMIN) 1000 MCG tablet Take by mouth.    . gabapentin (NEURONTIN) 100 MG capsule Take 2 capsules by mouth daily.      No current facility-administered medications for this visit.     Review of Systems:  GENERAL:  Feels "the same".  No fevers or sweats.  Weight stable since last appointment. PERFORMANCE STATUS (ECOG):  1-2 HEENT:  Ticking sound in right ear.  No visual changes, runny nose, sore throat, mouth sores or tenderness. Lungs:  Chronic shortness of breath.  No cough.  No hemoptysis. Cardiac:  No chest pain, palpitations, orthopnea, or PND. GI:  Poor appetite.  Diarrhea (see HPI).  Occasional nausea and vomiting.  No constipation, melena or hematochezia. GU:  No urgency, frequency, dysuria, or hematuria. Musculoskeletal:  Fibromyalgia.  No back pain.  No joint pain.  No muscle tenderness. Extremities:  No pain or swelling. Skin:  No rashes or skin changes. Neuro:  No headache, numbness or weakness, balance or coordination issues. Endocrine:  No diabetes.  Thyroid disease on Levothyroxine.  No hot flashes or night sweats. Psych:  No  mood changes, depression or anxiety. Pain:  No focal pain. Review of systems:  All other systems reviewed and found to be negative.  Physical Exam: Blood pressure 131/83, pulse (!) 52, temperature 98 F (36.7 C), temperature source Tympanic, resp. rate (!) 21, weight 131 lb 14.4 oz (59.8 kg), SpO2 97 %. GENERAL:  Well developed, well nourished, woman sitting comfortably in the exam room in no acute distress. MENTAL STATUS:  Alert and oriented to person, place and time. HEAD:  Short blonde hair.  Normocephalic, atraumatic, face symmetric, no Cushingoid features. EYES:  No conjunctivitis or scleral icterus. NEUROLOGICAL: Unremarkable. PSYCH:  Appropriate.   Orders Only on 08/01/2016  Component Date  Value Ref Range Status  . 5-HIAA, Ur 07/31/2016 1.6  Undefined mg/L Final  . 5-HIAA,Quant.,24 Hr Urine 07/31/2016 2.1  0.0 - 14.9 mg/24 hr Final   Comment: (NOTE) This test was developed and its performance characteristics determined by [facility]. It has not been cleared or approved by the Food and Drug Administration. Performed At: [facility] [place], [place] [id number] Lindon Romp MD [id number]   . Total Volume 07/31/2016 1300   Final    Assessment:  Nettie L Dobek is a 83 y.o. female with a neuroendocrine tumor s/p right hemicolectomy on 10/29/2012.  Pathology revealed a 2.0 cm grade I neuroendocrine tumor arising in the distal ileum.  Tumor extended through the muscularis propria into the adjacent adipose tissue.  Margins were negative.  There was lymph-vascular invasion.  Five of 14 lymph nodes were positive.  Pathologic stage was T3N1.  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  She received Sandostatin LAR 30 mg every 4 weeks.  She received injections monthly for about 2 years.  She was diagnosed with extensive bilateral pulmonary emboli on  11/06/2011.  There was a saddle embolus.  There was a large pulmonary emboli within the right main pulmonary artery extending into the segmental branches of the right upper, right lower and right middle lobe.  There were pulmonary emboli in the left upper lobe and peripheral branches of the left lower lobe artery segements.  She was treated with Lovenox and discharged on Xarelto.  Lower extremity duplex revealed occlusive thrombus in the right popliteal vein and near occlusive thrombus in the proximal superficial femoral vein.  An IVC filter was placed.  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  She remains on Xarelto.  She has a history of bilateral breast cancer.  She right breast cancer 20+ years ago. She underwent lumpectomy.  Left breast cancer was diagnosed 7-8 years ago. She never received radiation or chemotherapy. No details are available.  Symptomatically, she has chronic diarrhea.  Stool volume appears small.  She takes one Imodium if she is going out. There appears to be no dietary triggers. Stool studies were normal.  Weight is down 23 pounds since her surgery.  She denies any fevers or sweats.  Chromogranin A was 3 (0-5).  Urine for 5HIAA is pending.  Plan: 1.  Review results from blood work.  Chromagranin A is normal.  Await urine for 5-HIAA as it has a > 90% sensitivity and specificity. 2.  Await results of 24 hour urine for 5HIAA. 3.  No Sandostatin today 4.  RN to call patient with 5HIAA result 5.  Confirm preauth for sandostatin  Addendum:  5-HIAA was negative.  Patient notified of results.  GI was contacted for further evaluation of diarrhea.   Lequita Asal, MD  08/01/2016, 10:10 AM

## 2016-08-01 NOTE — Progress Notes (Signed)
Patient reports turning in 24 hour urine this am

## 2016-08-03 IMAGING — CR DG FLUORO RM GT 60 MIN
2 series · 2 of 2 positions shown · non-contrast
Comparison: CT left hip dated 07/02/2014

CLINICAL DATA: Left hip fracture

EXAM:
FLOURO RM GT 60 MIN; LEFT HIP (WITH PELVIS) 2-3 VIEWS
FLUOROSCOPY TIME:  Fluoroscopy Time (in minutes and seconds): 1
minutes 58 seconds
Number of Acquired Images:  2

[cont. (1 of 2)]
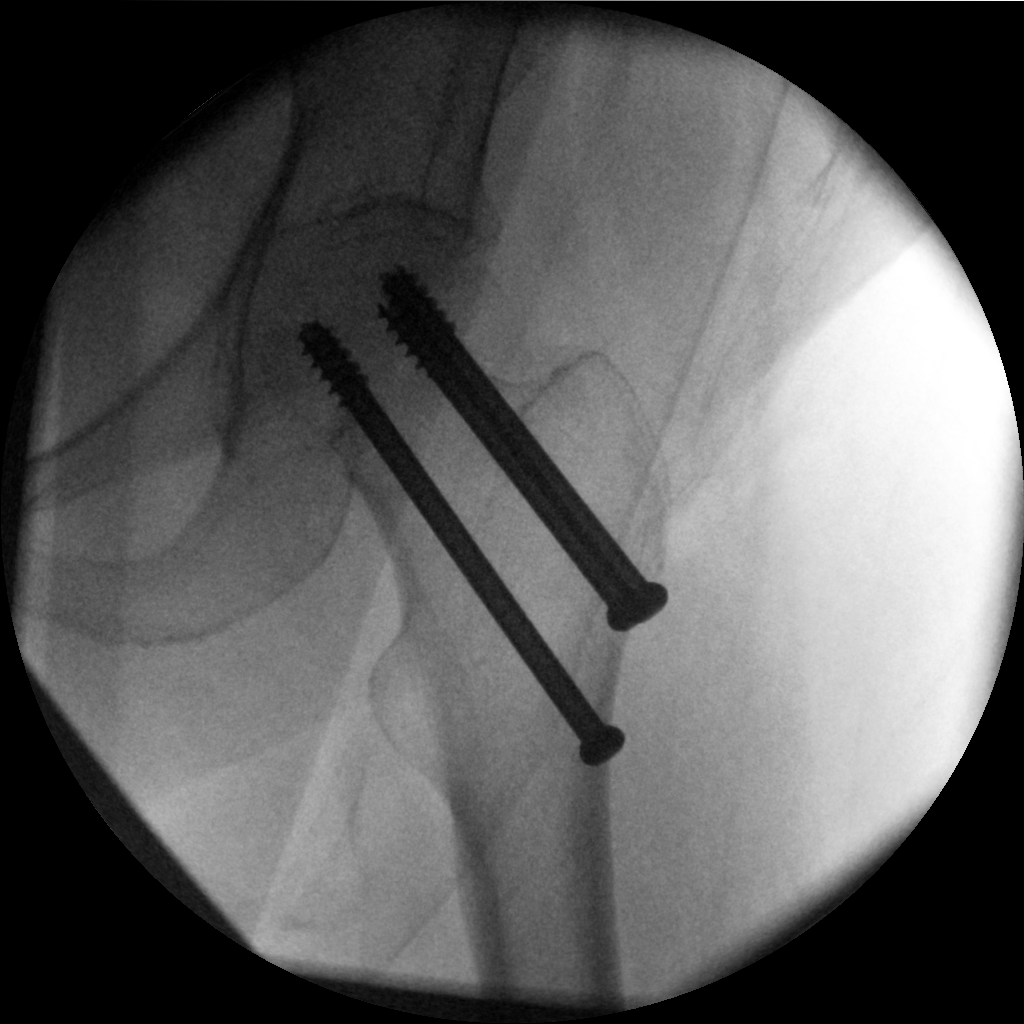

[cont. (2 of 2)]
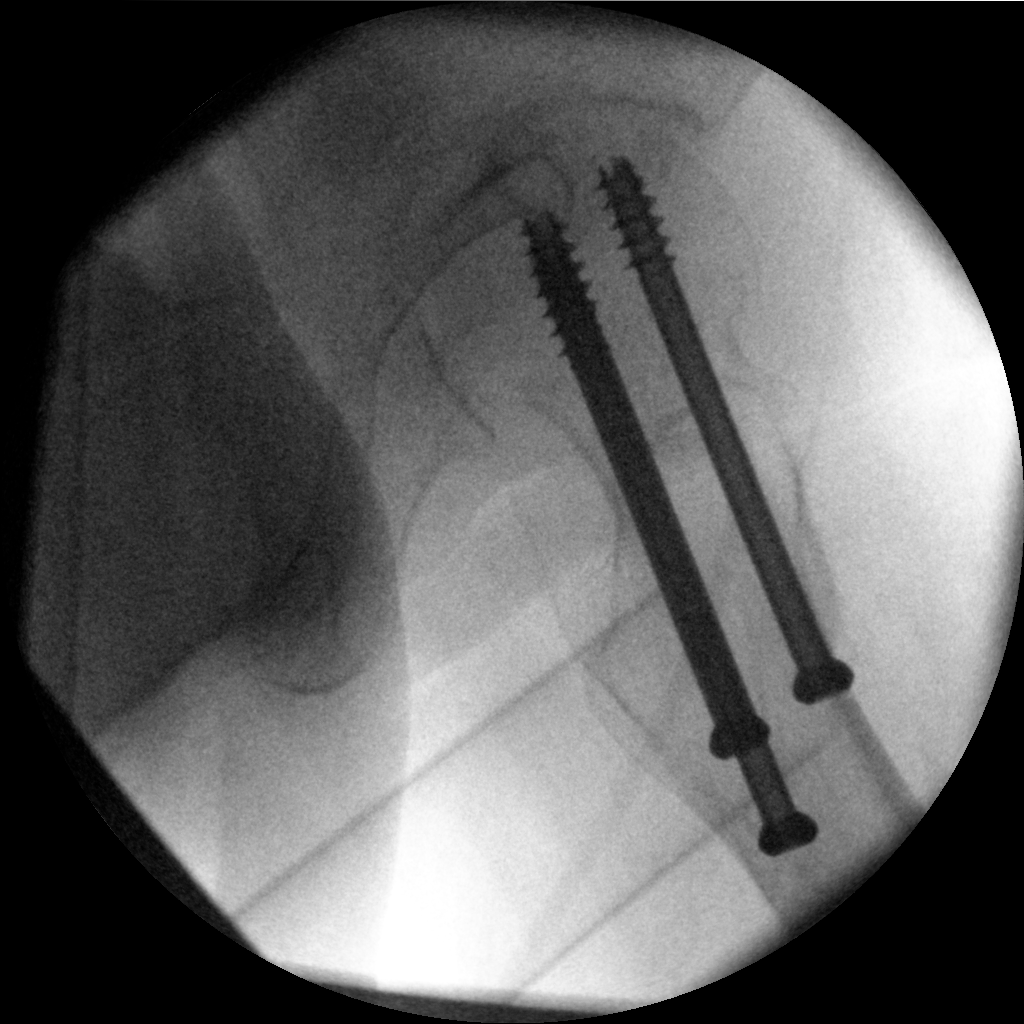

[2 of 2 positions shown; findings below may reference images not displayed]

FINDINGS: Intraoperative fluoroscopic images during ORIF of a subcapital left
proximal femur fracture.

Three cannulated cancellous screws transfix the fracture, which is
in near anatomic alignment and position.
IMPRESSION: Intraoperative fluoroscopic images during ORIF of a subcapital left
proximal femur fracture, as above.

## 2016-08-05 LAB — 5 HIAA, QUANTITATIVE, URINE, 24 HOUR
5-HIAA, Ur: 1.6 mg/L
5-HIAA,Quant.,24 Hr Urine: 2.1 mg/24 hr (ref 0.0–14.9)
Total Volume: 1300

## 2016-08-13 ENCOUNTER — Telehealth: Payer: Self-pay | Admitting: *Deleted

## 2016-08-13 NOTE — Telephone Encounter (Signed)
Patient called and would like to know results of her urine tests.  Please review results and advise if any further instructions.  Thanks!

## 2016-08-14 NOTE — Telephone Encounter (Signed)
  Please call patient.  Urine test negative.  No evidence of carcinoid/neuroendocrine tumor causing diarrhea.  GI informed.  They said they would be calling her to further work-up her diarrhea.  M

## 2016-08-15 NOTE — Telephone Encounter (Signed)
done

## 2016-08-15 NOTE — Telephone Encounter (Signed)
  Please call patient.  Urine test negative.  No evidence of carcinoid/neuroendocrine tumor causing diarrhea.  GI informed.  They said they would be calling her to further work-up her diarrhea.   Patient informed of above Voiced understanding.

## 2016-08-18 ENCOUNTER — Encounter: Payer: Self-pay | Admitting: Hematology and Oncology

## 2016-08-27 ENCOUNTER — Telehealth: Payer: Self-pay | Admitting: *Deleted

## 2016-08-27 NOTE — Telephone Encounter (Signed)
States she thought she was to get an appt and has not heard anything about it from 3 weeks ago. She states she has severe diarrhea and cannot keep anything in her. Please advise  Per LuAnn, she does not require an authorization for Sandostatin mentioned her her office note.  Per Letitia Libra, RN she has no needs for Sandostatin, she needs to see GI  Patient advised of this and was given their phone number. Then she said she has an appt already on 09/05/16

## 2016-10-02 ENCOUNTER — Other Ambulatory Visit
Admission: RE | Admit: 2016-10-02 | Discharge: 2016-10-02 | Disposition: A | Discharge status: Home / Self Care | Payer: Medicare Other | Source: Ambulatory Visit | Attending: Student | Admitting: Student

## 2016-10-02 DIAGNOSIS — K529 Noninfective gastroenteritis and colitis, unspecified: Secondary | ICD-10-CM | POA: Diagnosis present

## 2016-10-07 LAB — CALPROTECTIN, FECAL: Calprotectin, Fecal: <16 ug/g (ref 0–120)

## 2016-10-09 LAB — PANCREATIC ELASTASE, FECAL: Pancreatic Elastase-1, Stool: >500 ug Elast./g (ref >200)

## 2016-12-11 ENCOUNTER — Ambulatory Visit: Admit: 2016-12-11 | Payer: Medicare Other | Admitting: Unknown Physician Specialty

## 2016-12-11 SURGERY — COLONOSCOPY WITH PROPOFOL
Anesthesia: General

## 2016-12-30 ENCOUNTER — Ambulatory Visit: Payer: Medicare Other | Attending: Student | Admitting: Physical Therapy

## 2016-12-30 DIAGNOSIS — M6281 Muscle weakness (generalized): Secondary | ICD-10-CM | POA: Diagnosis not present

## 2016-12-30 DIAGNOSIS — R296 Repeated falls: Secondary | ICD-10-CM | POA: Diagnosis present

## 2016-12-30 DIAGNOSIS — R278 Other lack of coordination: Secondary | ICD-10-CM | POA: Insufficient documentation

## 2016-12-31 NOTE — Patient Instructions (Signed)
  Proper body mechanics with getting out of a chair to decrease strain  on back &pelvic floor   Avoid holding your breath when Getting out of the chair:  Scoot to front part of chair chair Heels behind feet, feet are hip width apart, nose over toes  Inhale like you are smelling roses Exhale to stand
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
## 2017-01-13 ENCOUNTER — Ambulatory Visit: Payer: Medicare Other | Attending: Student | Admitting: Physical Therapy

## 2017-01-13 DIAGNOSIS — R278 Other lack of coordination: Secondary | ICD-10-CM | POA: Diagnosis present

## 2017-01-13 DIAGNOSIS — R296 Repeated falls: Secondary | ICD-10-CM | POA: Diagnosis present

## 2017-01-13 DIAGNOSIS — M6281 Muscle weakness (generalized): Secondary | ICD-10-CM | POA: Diagnosis present

## 2017-01-14 NOTE — Patient Instructions (Signed)
Deep core level 1 ( with quick squeeze of pelvic floor)   Deep core level 2  Handout provided

## 2017-01-14 NOTE — Therapy (Signed)
[place] MAIN [facility] SERVICES [address] [place], [place], [address] Phone: [phone]   Fax:  [phone]  Physical Therapy Treatment  Patient Details  Name: Lauren Mccann MRN: [id number] Date of Birth: October 03, 1930 Referring Provider: Tammi Klippel PA   Encounter Date: 01/13/2017  PT End of Session - 01/14/17 1626    Visit Number  2    Number of Visits  12    Date for PT Re-Evaluation  03/24/17    Authorization Type  g code    PT Start Time  1500    PT Stop Time  1558    PT Time Calculation (min)  58 min    Activity Tolerance  Patient tolerated treatment well;No increased pain    Behavior During Therapy  WFL for tasks assessed/performed       Past Medical History:  Diagnosis Date  . A-fib ([place])   . Anemia   . Anxiety   . Breast cancer ([place]) 03/11/2014   Overview:  S/p right mastectomy.  Reoccurrence s/p left matstectomy.  . Cancer ([place])    breast  . Closed fracture of neck of left femur with routine healing 08/07/2014  . Depression   . Fibromyalgia   . GERD (gastroesophageal reflux disease)   . Hip fracture requiring operative repair ([place]) 07/04/2014  . Pulmonary embolism ([place])   . Thyroid disease     Past Surgical History:  Procedure Laterality Date  . ABDOMINAL HYSTERECTOMY    . CHOLECYSTECTOMY    . COLON SURGERY    . MASTECTOMY Bilateral     There were no vitals filed for this visit.  Subjective Assessment - 01/13/17 1527    Subjective  Pt has increased her water intake in the morning. Pt is still waking up soaked in the morning     Pertinent History   Gall bladder removal: with scar along upper abdomen.  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 Pt also takes Xarelto.  Pt has had headache for 1.5 years which no doctor has helped her because she can not get an MRI due to the vena cava implant. The headache is worse when laying down.  Pt had a stroke 12 years.  Breast Cancer  with both breasts removed R (  1987) L (2000).  2 vaginal deliveries . Both children deceased from [place]. Pt lives alone in an apt. Granddtr lives nearby and checks in with her 2x / week.  Pt is able to drive.  Pt is SOB by the time she gets dressed. Pt goes to church when she can ( 2x month) .           [facility] PT Assessment - 01/14/17 1624      6 minute walk test results    Aerobic Endurance Distance Walked  800 within 5 min, with [facility]     Endurance additional comments  1 rest break 2' at 3:22   93 SO2 84HR at 0', 94 SO2, 114 HR @3 '22", 94SO2, 91HR @ 5'                Pelvic Floor Special Questions - 01/14/17 1623    External Perineal Exam  through clothing, noted overuse of adductor/ gluts with pelvic floor contractions        [facility] Adult PT Treatment/Exercise - 01/14/17 1623      Neuro Re-ed    Neuro Re-ed Details   see pt instructions  PT Education - 01/14/17 1624    Education provided  Yes    Education Details  HEP    Person(s) Educated  Patient    Methods  Explanation;Demonstration;Tactile cues;Verbal cues;Handout    Comprehension  Verbalized understanding;Returned demonstration;Verbal cues required;Tactile cues required          PT Long Term Goals - 01/14/17 1625      PT LONG TERM GOAL #1   Title  Pt will report that her bed pad remain dry across 2x/ week in order to improve QOL    Time  10    Period  Weeks    Status  On-going      PT LONG TERM GOAL #2   Title  Pt will decrease her five times sit to stand time from 15 sec to < 12 sec in order to minimize risk for falls.,     Time  8    Period  Weeks    Status  On-going      PT LONG TERM GOAL #3   Title  Pt will decrease her PFDI score from % to < % in order to restore pelvic floor function    Time  12    Period  Weeks    Status  On-going      PT LONG TERM GOAL #4   Title  Pt will decrease her ODI score from % to <  % in order to return to ADLs    Time  10    Period  Weeks     Status  On-going      PT LONG TERM GOAL #5   Title  Pt will be IND with HEP     Time  4    Period  Weeks    Status  On-going      Additional Long Term Goals   Additional Long Term Goals  Yes      PT LONG TERM GOAL #6   Title  Pt will report improve stool consistency from [facility] Stool Type 6-7 to type 5-6 for 75% of the time in order to improve GI and pelvic floor function    Time  12    Period  Weeks    Status  On-going      PT LONG TERM GOAL #7   Title  Pt will demo increased endurance with 6 min walk test from 800' to >1000' without break in order to increase walking for exercise and health    Time  12    Period  Weeks    Status  New            Plan - 01/14/17 1627    Clinical Impression Statement  Pt showed low endurance with 6 minute walk test, requiring a rest break and only completing 800' within 5 min before fatigue. Pt was educated on pursed lip breathing when gettign SOB. Pt demo'd correctly.  Pt also showed overuse of adductors and gluts with pelvic floor contractions but was able to demo proper technique for quick contractions after training. Plan to assess pelvic floor at next session.  Pt continues to benefif from skilled PT.     Rehab Potential  Good    PT Frequency  1x / week    PT Duration  12 weeks    PT Treatment/Interventions  Neuromuscular re-education;Stair training;Gait training;Manual techniques;Patient/family education;Therapeutic activities;Moist Heat;Therapeutic exercise;Balance training;Scar mobilization;Energy conservation;Taping    Consulted and Agree with Plan of Care  Patient  Patient will benefit from skilled therapeutic intervention in order to improve the following deficits and impairments:  Pain, Improper body mechanics, Increased muscle spasms, Decreased scar mobility, Decreased mobility, Decreased coordination, Cardiopulmonary status limiting activity, Difficulty walking, Decreased safety awareness, Decreased balance, Decreased  endurance, Decreased activity tolerance, Decreased range of motion, Decreased strength, Impaired perceived functional ability, Hypomobility, Increased fascial restricitons, Abnormal gait  Visit Diagnosis: Muscle weakness (generalized)  Other lack of coordination  Repeated falls     Problem List Patient Active Problem List   Diagnosis Date Noted  . Diarrhea 07/29/2016  . Occipital neuralgia (Location of Primary Source of Pain) (Bilateral) (R>L) 11/29/2015  . Chronic knee pain (Bilateral) (R>L) 11/29/2015  . Chronic abdominal pain (RLQ) 11/29/2015  . 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 Chronic anticoagulation with Xarelto (stroke/pulmonary embolism) 11/29/2015  . History of breast cancer 11/29/2015  . Hyperlipidemia 11/28/2015  . Chronic pain 11/28/2015  . Long term current use of opiate analgesic 11/28/2015  . Long term prescription opiate use 11/28/2015  . Opiate use (15 MME/Day) 11/28/2015  . Long term prescription benzodiazepine use 11/28/2015  . Unilateral occipital headache (Right) 11/28/2015  . Multilevel spine pain (Location of Tertiary source of pain) (lumbar > thoracic) 11/28/2015  . Encounter for therapeutic drug level monitoring 11/28/2015  . Encounter for pain management planning 11/28/2015  . Chronic neck pain (Location of Secondary source of pain) (Bilateral) (R>L) 11/28/2015  . Cervical spine pain 11/28/2015  . Thoracic spine pain 11/28/2015  . Lumbar spine pain 11/28/2015  . Itching 10/17/2015  . Urinary incontinence in female 10/17/2015  . Pure hypercholesterolemia 07/12/2015  . Fibromyalgia 02/08/2015  . 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 Neuroendocrine tumor 02/08/2015  . Anemia, iron deficiency 11/01/2014  . Osteoarthritis of knee (Bilateral) (R>L) 05/23/2014  . Acquired hypothyroidism 05/14/2014  . Benign essential HTN 05/14/2014  . Anxiety and depression 05/14/2014  . Malignant neoplasm of breast ([place]) 03/11/2014  . Osteoporosis,  post-menopausal 03/11/2014  . Pulmonary embolism ([place]) 03/10/2014  . Depression 03/10/2014    Jerl Mina ,PT, DPT, E-RYT  01/14/2017, 4:30 PM  [place] MAIN [facility] SERVICES [address] [place], [place], [address] Phone: [phone]   Fax:  [phone]  Name: Lauren Mccann MRN: [id number] Date of Birth: [birth year]

## 2017-01-27 ENCOUNTER — Ambulatory Visit: Payer: Medicare Other | Admitting: Physical Therapy

## 2017-02-10 ENCOUNTER — Encounter: Payer: Medicare Other | Admitting: Physical Therapy

## 2017-02-17 ENCOUNTER — Encounter: Payer: Medicare Other | Admitting: Physical Therapy

## 2017-03-10 ENCOUNTER — Ambulatory Visit: Payer: Medicare Other | Admitting: Physical Therapy

## 2017-03-24 ENCOUNTER — Encounter: Payer: Medicare Other | Admitting: Physical Therapy

## 2017-06-23 ENCOUNTER — Other Ambulatory Visit: Payer: Self-pay

## 2017-06-23 ENCOUNTER — Encounter: Payer: Self-pay | Admitting: *Deleted

## 2017-06-23 ENCOUNTER — Encounter: Payer: Self-pay | Admitting: Anesthesiology

## 2017-06-26 NOTE — Discharge Instructions (Signed)

## 2017-06-30 NOTE — Progress Notes (Signed)
Upon calling patient to give her the arrival time for her cataract procedure, patient verbalized that she has been experiencing congestion and coughing, as well as a "red, swollen" area on her left arm, which she attributes to allergies. This was relayed to Dr. Earl Gala, anesthesia, who suggested the patient see her primary care doctor today; this was relayed to patient, who verbalizes understanding. If patient is not able to see her PCP today, anesthesia will assess her in the AM.

## 2017-07-01 ENCOUNTER — Encounter: Payer: Self-pay | Admitting: Emergency Medicine

## 2017-07-01 ENCOUNTER — Other Ambulatory Visit: Payer: Self-pay

## 2017-07-01 ENCOUNTER — Ambulatory Visit: Admission: RE | Admit: 2017-07-01 | Payer: Medicare Other | Source: Ambulatory Visit | Admitting: Ophthalmology

## 2017-07-01 ENCOUNTER — Ambulatory Visit
Admission: EM | Admit: 2017-07-01 | Discharge: 2017-07-01 | Disposition: A | Discharge status: Home / Self Care | Payer: Medicare Other | Attending: Family Medicine | Admitting: Family Medicine

## 2017-07-01 DIAGNOSIS — R21 Rash and other nonspecific skin eruption: Secondary | ICD-10-CM | POA: Diagnosis not present

## 2017-07-01 DIAGNOSIS — J069 Acute upper respiratory infection, unspecified: Secondary | ICD-10-CM

## 2017-07-01 MED ORDER — TRIAMCINOLONE ACETONIDE 0.025 % EX CREA: 1.0000 "application " | TOPICAL_CREAM | Freq: Two times a day (BID) | CUTANEOUS | 0 refills | Status: DC

## 2017-07-01 MED ORDER — AZITHROMYCIN 250 MG PO TABS: 250.0000 mg | ORAL_TABLET | Freq: Every day | ORAL | 0 refills | Status: DC

## 2017-07-01 MED ORDER — DOXYCYCLINE HYCLATE 100 MG PO CAPS: 100.0000 mg | ORAL_CAPSULE | Freq: Two times a day (BID) | ORAL | 0 refills | Status: DC

## 2017-07-01 MED ORDER — BENZONATATE 200 MG PO CAPS: ORAL_CAPSULE | ORAL | 0 refills | Status: DC

## 2017-07-01 NOTE — ED Triage Notes (Signed)
Patient c/o redness and itching in her left forearm that started on Sunday.  Patient also c/o itchy eyes and cough that started over the weekend.

## 2017-07-01 NOTE — ED Provider Notes (Signed)
[facility]    CSN: [id number] Arrival date & time: 07/01/17  9024     History   Chief Complaint Chief Complaint  Patient presents with  . Itchy Eye  . Rash  . Cough    HPI Lauren Mccann is a 84 y.o. female.   HPI  84 year old female scheduled for cataract surgery at the surgery center today because of a cough referred here.  She also has had a rash on her left arm she states it has been itchy.  It is improved over the last couple of days.  Not noticed anywhere else on her body.  States that she did have a similar rash before that was treated at the clinic with a shot of unknown composition.  Has had no fever or chills.  He said the cough has been mildly productive.           Past Medical History:  Diagnosis Date  . A-fib ([place])   . Anemia   . Anxiety   . Breast cancer ([place]) 03/11/2014   Overview:  S/p right mastectomy.  Reoccurrence s/p left matstectomy.  . Cancer ([place])    breast  . Closed fracture of neck of left femur with routine healing 08/07/2014  . Depression   . Fibromyalgia   . GERD (gastroesophageal reflux disease)   . Hip fracture requiring operative repair ([place]) 07/04/2014  . Pulmonary embolism ([place])   . Thyroid disease     Patient Active Problem List   Diagnosis Date Noted  . Diarrhea 07/29/2016  . Occipital neuralgia (Location of Primary Source of Pain) (Bilateral) (R>L) 11/29/2015  . Chronic knee pain (Bilateral) (R>L) 11/29/2015  . Chronic abdominal pain (RLQ) 11/29/2015  . 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 Chronic anticoagulation with Xarelto (stroke/pulmonary embolism) 11/29/2015  . History of breast cancer 11/29/2015  . Hyperlipidemia 11/28/2015  . Chronic pain 11/28/2015  . Long term current use of opiate analgesic 11/28/2015  . Long term prescription opiate use 11/28/2015  . Opiate use (15 MME/Day) 11/28/2015  . Long term prescription benzodiazepine use 11/28/2015  . Unilateral occipital headache (Right)  11/28/2015  . Multilevel spine pain (Location of Tertiary source of pain) (lumbar > thoracic) 11/28/2015  . Encounter for therapeutic drug level monitoring 11/28/2015  . Encounter for pain management planning 11/28/2015  . Chronic neck pain (Location of Secondary source of pain) (Bilateral) (R>L) 11/28/2015  . Cervical spine pain 11/28/2015  . Thoracic spine pain 11/28/2015  . Lumbar spine pain 11/28/2015  . Itching 10/17/2015  . Urinary incontinence in female 10/17/2015  . Pure hypercholesterolemia 07/12/2015  . Fibromyalgia 02/08/2015  . 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 Neuroendocrine tumor 02/08/2015  . Anemia, iron deficiency 11/01/2014  . Osteoarthritis of knee (Bilateral) (R>L) 05/23/2014  . Acquired hypothyroidism 05/14/2014  . Benign essential HTN 05/14/2014  . Anxiety and depression 05/14/2014  . Malignant neoplasm of breast ([place]) 03/11/2014  . 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 Pulmonary embolism ([place]) 03/10/2014  . Depression 03/10/2014    Past Surgical History:  Procedure Laterality Date  . ABDOMINAL HYSTERECTOMY    . CHOLECYSTECTOMY    . COLON SURGERY    . HIP PINNING,CANNULATED Left 07/03/2014   Procedure: CANNULATED HIP PINNING;  Surgeon: Claud Kelp, MD;  Location: [facility] ORS;  Service: Orthopedics;  Laterality: Left;  Marland Kitchen MASTECTOMY Bilateral     OB History   None      Home Medications    Prior to Admission medications   Medication Sig  Start Date End Date Taking? Authorizing Provider  baclofen (LIORESAL) 10 MG tablet  09/28/15  Yes [provider]  diltiazem (CARDIZEM CD) 120 MG 24 hr capsule TAKE ONE CAPSULE DAILY 10/30/15  Yes [provider]  gabapentin (NEURONTIN) 100 MG capsule Take 2 capsules by mouth daily.  05/12/14  Yes [provider]  levocetirizine (XYZAL) 5 MG tablet Take by mouth. 10/17/15 07/01/17 Yes [provider]  levothyroxine (SYNTHROID, LEVOTHROID) 25 MCG tablet TAKE ONE TABLET DAILY ON EMPTY  STOMACH WITH GLASS OF WATER AT LEAST 30-60 MINUTES BEFORE BREAKFAST 01/25/15  Yes [provider]  Multiple Vitamin (MULTI-VITAMIN DAILY PO) Take by mouth daily.   Yes [provider]  rivaroxaban (XARELTO) 20 MG TABS tablet Take 1 tablet (20 mg total) by mouth daily. 07/06/14  Yes Wieting, Richard, MD  sertraline (ZOLOFT) 100 MG tablet Take 1 tablet by mouth daily. 04/13/14  Yes [provider]  tiZANidine (ZANAFLEX) 2 MG tablet  12/28/14  Yes [provider]  vitamin B-12 (CYANOCOBALAMIN) 1000 MCG tablet Take by mouth.   Yes [provider]  acetaminophen (TYLENOL) 500 MG tablet Take 2 tablets (1,000 mg total) by mouth every 8 (eight) hours as needed for headache. 03/27/15   Eula Listen, MD  ALPRAZolam Duanne Moron) 1 MG tablet Take half tablet twice daily as needed. 01/30/15   [provider]  benzonatate (TESSALON) 200 MG capsule Take one cap TID PRN cough 07/01/17   Lorin Picket, PA-C  doxycycline (VIBRAMYCIN) 100 MG capsule Take 1 capsule (100 mg total) by mouth 2 (two) times daily. 07/01/17   Lorin Picket, PA-C  lidocaine (ASPERCREME W/LIDOCAINE) 4 % cream Apply 1 application topically as needed.    [provider]  loperamide (IMODIUM) 2 MG capsule Take by mouth daily.    [provider]  ondansetron (ZOFRAN) 4 MG tablet Take 1 tablet by mouth. Every 4 to 6 hours as needed for nausea, vomiting 01/04/14   [provider]  triamcinolone (KENALOG) 0.025 % cream Apply 1 application topically 2 (two) times daily. 07/01/17   Lorin Picket, PA-C    Family History Family History  Problem Relation Age of Onset  . Cancer Father        Bone  . Kidney cancer Neg Hx   . Prostate cancer Neg Hx   . Bladder Cancer Neg Hx     Social History Social History   Tobacco Use  . Smoking status: Never Smoker  . Smokeless tobacco: Never Used  Substance Use Topics  . Alcohol use: No  . Drug use: No      Allergies   Codeine; Propoxyphene; Alendronate sodium; Atorvastatin; Benadryl [diphenhydramine hcl]; Cefuroxime axetil; Cymbalta [duloxetine hcl]; Prednisone; and Levofloxacin   Review of Systems Review of Systems  Constitutional: Positive for activity change. Negative for appetite change, chills, fatigue and fever.  HENT: Positive for congestion and postnasal drip.   Respiratory: Positive for cough.   Skin: Positive for rash.  All other systems reviewed and are negative.    Physical Exam Triage Vital Signs ED Triage Vitals  Enc Vitals Group     BP 07/01/17 0836 112/75     Pulse Rate 07/01/17 0836 87     Resp 07/01/17 0836 16     Temp 07/01/17 0836 98.1 F (36.7 C)     Temp Source 07/01/17 0836 Oral     SpO2 07/01/17 0836 97 %     Weight 07/01/17 0831 130 lb (59 kg)  Height 07/01/17 0831 5\' 2"  (1.575 m)     Head Circumference --      Peak Flow --      Pain Score 07/01/17 0831 0     Pain Loc --      Pain Edu? --      Excl. in [place]? --    No data found.  Updated Vital Signs BP 112/75 (BP Location: Right Arm)   Pulse 87   Temp 98.1 F (36.7 C) (Oral)   Resp 16   Ht 5\' 2"  (1.575 m)   Wt 130 lb (59 kg)   SpO2 97%   BMI 23.78 kg/m   Visual Acuity Right Eye Distance:   Left Eye Distance:   Bilateral Distance:    Right Eye Near:   Left Eye Near:    Bilateral Near:     Physical Exam  Nursing note and vitals reviewed.        UC Treatments / Results  Labs (all labs ordered are listed, but only abnormal results are displayed) Labs Reviewed - No data to display  EKG None  Radiology No results found.  Procedures Procedures (including critical care time)  Medications Ordered in UC Medications - No data to display  Initial Impression / Assessment and Plan / UC Course  I have reviewed the triage vital signs and the nursing notes.  Pertinent labs & imaging results that were available during my care of the patient were reviewed by me and  considered in my medical decision making (see chart for details).     Plan: 1. Test/x-ray results and diagnosis reviewed with patient 2. rx as per orders; risks, benefits, potential side effects reviewed with patient 3. Recommend supportive treatment with Tessalon Perles for cough suppression.  Given her triamcinolone cream for the suppression of the itchiness.  Treat her with doxycycline for the possibility of a infection of the skin on her arm although doubtful, as well as her lungs.  Imagine she should be ready for cataract surgery in 1 week.  If she is not improving she should follow-up with her primary care physician. 4. F/u prn if symptoms worsen or don't improve  Final Clinical Impressions(s) / UC Diagnoses   Final diagnoses:  Upper respiratory tract infection, unspecified type  Rash and nonspecific skin eruption   Discharge Instructions   None    ED Prescriptions    Medication Sig Dispense Auth. Provider   azithromycin (ZITHROMAX) 250 MG tablet  (Status: Discontinued) Take 1 tablet (250 mg total) by mouth daily. Take first 2 tablets together, then 1 every day until finished. 6 tablet Lorin Picket, PA-C   benzonatate (TESSALON) 200 MG capsule Take one cap TID PRN cough 30 capsule Crecencio Mc P, PA-C   triamcinolone (KENALOG) 0.025 % cream  (Status: Discontinued) Apply 1 application topically 2 (two) times daily. 30 g Crecencio Mc P, PA-C   triamcinolone (KENALOG) 0.025 % cream Apply 1 application topically 2 (two) times daily. 30 g Crecencio Mc P, PA-C   doxycycline (VIBRAMYCIN) 100 MG capsule Take 1 capsule (100 mg total) by mouth 2 (two) times daily. 14 capsule Lorin Picket, PA-C     Controlled Substance Prescriptions [place] Controlled Substance Registry consulted? Not Applicable   Lorin Picket, PA-C 07/01/17 2053

## 2017-07-01 NOTE — Progress Notes (Signed)
The pt called the surgery center yesterday to notify us that she has been having a cough, itchy eyes and congestion that started over the weekend.  She also complained of redness and swelling of the forearm.  She was advised to follow-up with her PCP. The pt presented today for cataract surgery with the same complaints.  She was not able to see her PCP.  She says her PCP has been dismissing her complaints and she is in the process of trying to a find a new PCP.  Given her symptoms, we elected to postpone her cataract surgery.  The pt would like to go to urgent care so we sent her there.  She will follow-up with Dr. Edison Pace to reschedule.

## 2017-08-15 MED ORDER — ARMC OPHTHALMIC DILATING DROPS
1.0000 | OPHTHALMIC | Status: DC | PRN
Start: 2017-08-15 — End: 2017-08-15

## 2017-08-15 MED ORDER — LACTATED RINGERS IV SOLN: 10.0000 mL/h | INTRAVENOUS | Status: DC

## 2017-08-15 NOTE — Discharge Instructions (Signed)

## 2017-08-18 ENCOUNTER — Encounter
Admission: RE | Disposition: A | Discharge status: Home / Self Care | Payer: Self-pay | Source: Ambulatory Visit | Attending: Ophthalmology

## 2017-08-18 ENCOUNTER — Ambulatory Visit
Admission: RE | Admit: 2017-08-18 | Discharge: 2017-08-18 | Disposition: A | Discharge status: Home / Self Care | Payer: Medicare Other | Source: Ambulatory Visit | Attending: Ophthalmology | Admitting: Ophthalmology

## 2017-08-18 ENCOUNTER — Ambulatory Visit: Payer: Medicare Other | Admitting: Anesthesiology

## 2017-08-18 ENCOUNTER — Ambulatory Visit: Admission: RE | Admit: 2017-08-18 | Payer: Medicare Other | Source: Ambulatory Visit | Admitting: Ophthalmology

## 2017-08-18 DIAGNOSIS — Z888 Allergy status to other drugs, medicaments and biological substances status: Secondary | ICD-10-CM | POA: Insufficient documentation

## 2017-08-18 DIAGNOSIS — Z9049 Acquired absence of other specified parts of digestive tract: Secondary | ICD-10-CM | POA: Insufficient documentation

## 2017-08-18 DIAGNOSIS — E039 Hypothyroidism, unspecified: Secondary | ICD-10-CM | POA: Insufficient documentation

## 2017-08-18 DIAGNOSIS — D649 Anemia, unspecified: Secondary | ICD-10-CM | POA: Insufficient documentation

## 2017-08-18 DIAGNOSIS — M797 Fibromyalgia: Secondary | ICD-10-CM | POA: Insufficient documentation

## 2017-08-18 DIAGNOSIS — F419 Anxiety disorder, unspecified: Secondary | ICD-10-CM | POA: Insufficient documentation

## 2017-08-18 DIAGNOSIS — R42 Dizziness and giddiness: Secondary | ICD-10-CM | POA: Insufficient documentation

## 2017-08-18 DIAGNOSIS — I10 Essential (primary) hypertension: Secondary | ICD-10-CM | POA: Diagnosis not present

## 2017-08-18 DIAGNOSIS — Z7901 Long term (current) use of anticoagulants: Secondary | ICD-10-CM | POA: Insufficient documentation

## 2017-08-18 DIAGNOSIS — I4891 Unspecified atrial fibrillation: Secondary | ICD-10-CM | POA: Diagnosis not present

## 2017-08-18 DIAGNOSIS — Z8673 Personal history of transient ischemic attack (TIA), and cerebral infarction without residual deficits: Secondary | ICD-10-CM | POA: Insufficient documentation

## 2017-08-18 DIAGNOSIS — F329 Major depressive disorder, single episode, unspecified: Secondary | ICD-10-CM | POA: Diagnosis not present

## 2017-08-18 DIAGNOSIS — Z853 Personal history of malignant neoplasm of breast: Secondary | ICD-10-CM | POA: Insufficient documentation

## 2017-08-18 DIAGNOSIS — Z90710 Acquired absence of both cervix and uterus: Secondary | ICD-10-CM | POA: Insufficient documentation

## 2017-08-18 DIAGNOSIS — M810 Age-related osteoporosis without current pathological fracture: Secondary | ICD-10-CM | POA: Diagnosis not present

## 2017-08-18 DIAGNOSIS — G629 Polyneuropathy, unspecified: Secondary | ICD-10-CM | POA: Insufficient documentation

## 2017-08-18 DIAGNOSIS — M1990 Unspecified osteoarthritis, unspecified site: Secondary | ICD-10-CM | POA: Diagnosis not present

## 2017-08-18 DIAGNOSIS — Z885 Allergy status to narcotic agent status: Secondary | ICD-10-CM | POA: Diagnosis not present

## 2017-08-18 DIAGNOSIS — Z86711 Personal history of pulmonary embolism: Secondary | ICD-10-CM | POA: Diagnosis not present

## 2017-08-18 DIAGNOSIS — R251 Tremor, unspecified: Secondary | ICD-10-CM | POA: Diagnosis not present

## 2017-08-18 DIAGNOSIS — H2511 Age-related nuclear cataract, right eye: Secondary | ICD-10-CM | POA: Insufficient documentation

## 2017-08-18 DIAGNOSIS — K589 Irritable bowel syndrome without diarrhea: Secondary | ICD-10-CM | POA: Diagnosis not present

## 2017-08-18 HISTORY — DX: Unspecified osteoarthritis, unspecified site: M19.90

## 2017-08-18 HISTORY — DX: Tremor, unspecified: R25.1

## 2017-08-18 HISTORY — DX: Dyspnea, unspecified: R06.00

## 2017-08-18 HISTORY — DX: Polyneuropathy, unspecified: G62.9

## 2017-08-18 HISTORY — DX: Age-related osteoporosis without current pathological fracture: M81.0

## 2017-08-18 HISTORY — PX: CATARACT EXTRACTION W/PHACO: SHX586

## 2017-08-18 HISTORY — DX: Cerebral infarction, unspecified: I63.9

## 2017-08-18 HISTORY — DX: Headache: R51

## 2017-08-18 HISTORY — DX: Thyrotoxicosis, unspecified without thyrotoxic crisis or storm: E05.90

## 2017-08-18 HISTORY — DX: Headache, unspecified: R51.9

## 2017-08-18 HISTORY — DX: Essential (primary) hypertension: I10

## 2017-08-18 HISTORY — DX: Dizziness and giddiness: R42

## 2017-08-18 HISTORY — DX: Irritable bowel syndrome, unspecified: K58.9

## 2017-08-18 HISTORY — DX: Personal history of other specified conditions: Z87.898

## 2017-08-18 SURGERY — PHACOEMULSIFICATION, CATARACT, WITH IOL INSERTION
Anesthesia: Monitor Anesthesia Care | Site: Eye | Laterality: Right | Wound class: "Clean "

## 2017-08-18 SURGERY — PHACOEMULSIFICATION, CATARACT, WITH IOL INSERTION
Anesthesia: Topical | Laterality: Right

## 2017-08-18 MED ORDER — FENTANYL CITRATE (PF) 100 MCG/2ML IJ SOLN
INTRAMUSCULAR | Status: DC | PRN
  Administered 2017-08-18: 50 ug via INTRAVENOUS

## 2017-08-18 MED ORDER — ARMC OPHTHALMIC DILATING DROPS
1.0000 "application " | OPHTHALMIC | Status: DC | PRN
  Administered 2017-08-18 (×3): 1 via OPHTHALMIC

## 2017-08-18 MED ORDER — LIDOCAINE HCL (PF) 2 % IJ SOLN
INTRAOCULAR | Status: DC | PRN
  Administered 2017-08-18: 1 mL via INTRAOCULAR

## 2017-08-18 MED ORDER — EPINEPHRINE PF 1 MG/ML IJ SOLN
INTRAOCULAR | Status: DC | PRN
  Administered 2017-08-18: 140 mL via OPHTHALMIC

## 2017-08-18 MED ORDER — SODIUM HYALURONATE 23 MG/ML IO SOLN
INTRAOCULAR | Status: DC | PRN
  Administered 2017-08-18: 0.6 mL via INTRAOCULAR

## 2017-08-18 MED ORDER — POLYMYXIN B-TRIMETHOPRIM 10000-0.1 UNIT/ML-% OP SOLN: 1.0000 [drp] | OPHTHALMIC | Status: DC | PRN

## 2017-08-18 MED ORDER — POLYMYXIN B-TRIMETHOPRIM 10000-0.1 UNIT/ML-% OP SOLN
OPHTHALMIC | Status: DC | PRN
  Administered 2017-08-18: 2 [drp]

## 2017-08-18 MED ORDER — MIDAZOLAM HCL 2 MG/2ML IJ SOLN
INTRAMUSCULAR | Status: DC | PRN
  Administered 2017-08-18: 1 mg via INTRAVENOUS

## 2017-08-18 MED ORDER — SODIUM HYALURONATE 10 MG/ML IO SOLN
INTRAOCULAR | Status: DC | PRN
  Administered 2017-08-18: 0.55 mL via INTRAOCULAR

## 2017-08-18 MED ORDER — LACTATED RINGERS IV SOLN: 10.0000 mL/h | INTRAVENOUS | Status: DC

## 2017-08-18 SURGICAL SUPPLY — 17 items
CANNULA ANT/CHMB 27G (MISCELLANEOUS) ×1 IMPLANT
CANNULA ANT/CHMB 27GA (MISCELLANEOUS) ×3 IMPLANT
DISSECTOR HYDRO NUCLEUS 50X22 (MISCELLANEOUS) ×3 IMPLANT
GLOVE BIO SURGEON STRL SZ8 (GLOVE) ×3 IMPLANT
GLOVE SURG LX 7.5 STRW (GLOVE) ×2
GLOVE SURG LX STRL 7.5 STRW (GLOVE) ×1 IMPLANT
GOWN STRL REUS W/ TWL LRG LVL3 (GOWN DISPOSABLE) ×2 IMPLANT
GOWN STRL REUS W/TWL LRG LVL3 (GOWN DISPOSABLE) ×4
LENS IOL TECNIS ITEC 23.0 (Intraocular Lens) ×2 IMPLANT
MARKER SKIN DUAL TIP RULER LAB (MISCELLANEOUS) ×3 IMPLANT
PACK CATARACT (MISCELLANEOUS) ×3 IMPLANT
PACK DR. KING ARMS (PACKS) ×3 IMPLANT
PACK EYE AFTER SURG (MISCELLANEOUS) ×3 IMPLANT
SYR 3ML LL SCALE MARK (SYRINGE) ×3 IMPLANT
SYR TB 1ML LUER SLIP (SYRINGE) ×3 IMPLANT
WATER STERILE IRR 500ML POUR (IV SOLUTION) ×3 IMPLANT
WIPE NON LINTING 3.25X3.25 (MISCELLANEOUS) ×3 IMPLANT

## 2017-08-18 SURGICAL SUPPLY — 15 items
CANNULA ANT/CHMB 27GA (MISCELLANEOUS) ×3 IMPLANT
DISSECTOR HYDRO NUCLEUS 50X22 (MISCELLANEOUS) ×3 IMPLANT
GLOVE BIO SURGEON STRL SZ8 (GLOVE) ×3 IMPLANT
GLOVE SURG LX 7.5 STRW (GLOVE) ×2
GLOVE SURG LX STRL 7.5 STRW (GLOVE) ×1 IMPLANT
GOWN STRL REUS W/ TWL LRG LVL3 (GOWN DISPOSABLE) ×2 IMPLANT
GOWN STRL REUS W/TWL LRG LVL3 (GOWN DISPOSABLE) ×4
MARKER SKIN DUAL TIP RULER LAB (MISCELLANEOUS) ×3 IMPLANT
PACK CATARACT (MISCELLANEOUS) ×3 IMPLANT
PACK DR. KING ARMS (PACKS) ×3 IMPLANT
PACK EYE AFTER SURG (MISCELLANEOUS) ×3 IMPLANT
SYR 3ML LL SCALE MARK (SYRINGE) ×3 IMPLANT
SYR TB 1ML LUER SLIP (SYRINGE) ×3 IMPLANT
WATER STERILE IRR 500ML POUR (IV SOLUTION) ×3 IMPLANT
WIPE NON LINTING 3.25X3.25 (MISCELLANEOUS) ×3 IMPLANT

## 2017-08-18 NOTE — Anesthesia Postprocedure Evaluation (Signed)
Anesthesia Post Note  Patient: Lauren Mccann  Procedure(s) Performed: CATARACT EXTRACTION PHACO AND INTRAOCULAR LENS PLACEMENT (IOC) right (Right Eye)  Patient location during evaluation: PACU Anesthesia Type: MAC Level of consciousness: awake and alert, oriented and patient cooperative Pain management: pain level controlled Vital Signs Assessment: post-procedure vital signs reviewed and stable Respiratory status: spontaneous breathing, nonlabored ventilation and respiratory function stable Cardiovascular status: blood pressure returned to baseline and stable Postop Assessment: adequate PO intake Anesthetic complications: no    Darrin Nipper

## 2017-08-18 NOTE — H&P (Signed)
The History and Physical notes are on paper, have been signed, and are to be scanned.   I have examined the patient and there are no changes to the H&P.   [name] 08/18/2017 10:21 AM

## 2017-08-18 NOTE — Op Note (Signed)
OPERATIVE NOTE  Lauren Mccann [id number] 08/18/2017   PREOPERATIVE DIAGNOSIS:  Nuclear sclerotic cataract right eye.  H25.11   POSTOPERATIVE DIAGNOSIS:    Nuclear sclerotic cataract right eye.     PROCEDURE:  Phacoemusification with posterior chamber intraocular lens placement of the right eye   LENS:   Implant Name Type Inv. Item Serial No. Manufacturer Lot No. LRB No. Used  LENS IOL DIOP 23.0 - [id number] Intraocular Lens LENS IOL DIOP 23.0 [id number] AMO  Right 1       PCB00 +23.0   ULTRASOUND TIME: 1 minutes 58 seconds.  CDE 29.02   SURGEON:  [name], MD, MPH  ANESTHESIOLOGIST: Anesthesiologist: Darrin Nipper, MD CRNA: Mayme Genta, CRNA   ANESTHESIA:  Topical with tetracaine drops augmented with 1% preservative-free intracameral lidocaine.  ESTIMATED BLOOD LOSS: less than 1 mL.   COMPLICATIONS:  None.   DESCRIPTION OF PROCEDURE:  The patient was identified in the holding room and transported to the operating room and placed in the supine position under the operating microscope.  The right eye was identified as the operative eye and it was prepped and draped in the usual sterile ophthalmic fashion.   A 1.0 millimeter clear-corneal paracentesis was made at the 10:30 position. 0.5 ml of preservative-free 1% lidocaine with epinephrine was injected into the anterior chamber.  The anterior chamber was filled with Healon 5 viscoelastic.  A 2.4 millimeter keratome was used to make a near-clear corneal incision at the 8:00 position.  A curvilinear capsulorrhexis was made with a cystotome and capsulorrhexis forceps.  Balanced salt solution was used to hydrodissect and hydrodelineate the nucleus.   Phacoemulsification was then used in stop and chop fashion to remove the lens nucleus and epinucleus.  The remaining cortex was then removed using the irrigation and aspiration handpiece. Healon was then placed into the capsular bag to distend it for lens placement.  A lens was  then injected into the capsular bag.  The remaining viscoelastic was aspirated.   Wounds were hydrated with balanced salt solution.  The anterior chamber was inflated to a physiologic pressure with balanced salt solution.   Intracameral vigamox 0.1 mL undiluted was injected into the eye and a drop placed onto the ocular surface.  No wound leaks were noted.  The patient was taken to the recovery room in stable condition without complications of anesthesia or surgery  [name] 08/18/2017, 10:58 AM

## 2017-08-18 NOTE — Transfer of Care (Signed)
Immediate Anesthesia Transfer of Care Note  Patient: Lauren Mccann  Procedure(s) Performed: CATARACT EXTRACTION PHACO AND INTRAOCULAR LENS PLACEMENT (IOC) right (Right Eye)  Patient Location: PACU  Anesthesia Type: MAC  Level of Consciousness: awake, alert  and patient cooperative  Airway and Oxygen Therapy: Patient Spontanous Breathing and Patient connected to supplemental oxygen  Post-op Assessment: Post-op Vital signs reviewed, Patient's Cardiovascular Status Stable, Respiratory Function Stable, Patent Airway and No signs of Nausea or vomiting  Post-op Vital Signs: Reviewed and stable  Complications: No apparent anesthesia complications

## 2017-08-18 NOTE — Anesthesia Procedure Notes (Signed)
Procedure Name: MAC Performed by: Dewan Emond, CRNA Pre-anesthesia Checklist: Patient identified, Emergency Drugs available, Suction available, Timeout performed and Patient being monitored Patient Re-evaluated:Patient Re-evaluated prior to induction Oxygen Delivery Method: Nasal cannula Placement Confirmation: positive ETCO2       

## 2017-08-18 NOTE — Anesthesia Preprocedure Evaluation (Signed)
Anesthesia Evaluation  Patient identified by MRN, date of birth, ID band Patient awake    Reviewed: Allergy & Precautions, NPO status , Patient's Chart, lab work & pertinent test results  History of Anesthesia Complications Negative for: history of anesthetic complications  Airway Mallampati: III  TM Distance: >3 FB Neck ROM: Full    Dental  (+)    Pulmonary neg pulmonary ROS,    Pulmonary exam normal breath sounds clear to auscultation       Cardiovascular hypertension, Normal cardiovascular exam+ dysrhythmias (a fib on Xarelto)  Rhythm:Regular Rate:Normal  Hx PE s/p IVC filter   Neuro/Psych PSYCHIATRIC DISORDERS Anxiety Depression Vertigo  Neuromuscular disease (neuropathy) CVA (approx 2004; no residual deficits)    GI/Hepatic GERD  ,  Endo/Other  Hypothyroidism   Renal/GU negative Renal ROS     Musculoskeletal  (+) Fibromyalgia -  Abdominal   Peds  Hematology  (+) Blood dyscrasia, anemia , Breast CA   Anesthesia Other Findings   Reproductive/Obstetrics                             Anesthesia Physical Anesthesia Plan  ASA: IV  Anesthesia Plan: MAC   Post-op Pain Management:    Induction: Intravenous  PONV Risk Score and Plan: 2 and TIVA and Midazolam  Airway Management Planned: Natural Airway  Additional Equipment:   Intra-op Plan:   Post-operative Plan:   Informed Consent: I have reviewed the patients History and Physical, chart, labs and discussed the procedure including the risks, benefits and alternatives for the proposed anesthesia with the patient or authorized representative who has indicated his/her understanding and acceptance.     Plan Discussed with: CRNA  Anesthesia Plan Comments:         Anesthesia Quick Evaluation

## 2017-08-19 ENCOUNTER — Encounter: Payer: Self-pay | Admitting: Ophthalmology

## 2017-09-03 ENCOUNTER — Encounter: Payer: Self-pay | Admitting: Ophthalmology

## 2018-09-13 ENCOUNTER — Other Ambulatory Visit: Payer: Self-pay

## 2018-09-13 ENCOUNTER — Emergency Department: Payer: Medicare Other

## 2018-09-13 ENCOUNTER — Emergency Department
Admission: EM | Admit: 2018-09-13 | Discharge: 2018-09-13 | Disposition: A | Discharge status: Home / Self Care | Payer: Medicare Other | Attending: Emergency Medicine | Admitting: Emergency Medicine

## 2018-09-13 DIAGNOSIS — M25512 Pain in left shoulder: Secondary | ICD-10-CM | POA: Diagnosis not present

## 2018-09-13 DIAGNOSIS — S0990XA Unspecified injury of head, initial encounter: Secondary | ICD-10-CM

## 2018-09-13 DIAGNOSIS — Y998 Other external cause status: Secondary | ICD-10-CM | POA: Insufficient documentation

## 2018-09-13 DIAGNOSIS — Z853 Personal history of malignant neoplasm of breast: Secondary | ICD-10-CM | POA: Diagnosis not present

## 2018-09-13 DIAGNOSIS — I10 Essential (primary) hypertension: Secondary | ICD-10-CM | POA: Insufficient documentation

## 2018-09-13 DIAGNOSIS — Z79899 Other long term (current) drug therapy: Secondary | ICD-10-CM | POA: Diagnosis not present

## 2018-09-13 DIAGNOSIS — Y929 Unspecified place or not applicable: Secondary | ICD-10-CM | POA: Diagnosis not present

## 2018-09-13 DIAGNOSIS — Z7901 Long term (current) use of anticoagulants: Secondary | ICD-10-CM | POA: Insufficient documentation

## 2018-09-13 DIAGNOSIS — Y9389 Activity, other specified: Secondary | ICD-10-CM | POA: Insufficient documentation

## 2018-09-13 DIAGNOSIS — M25511 Pain in right shoulder: Secondary | ICD-10-CM | POA: Insufficient documentation

## 2018-09-13 DIAGNOSIS — W01198A Fall on same level from slipping, tripping and stumbling with subsequent striking against other object, initial encounter: Secondary | ICD-10-CM | POA: Insufficient documentation

## 2018-09-13 DIAGNOSIS — S0101XA Laceration without foreign body of scalp, initial encounter: Secondary | ICD-10-CM | POA: Diagnosis not present

## 2018-09-13 DIAGNOSIS — W19XXXA Unspecified fall, initial encounter: Secondary | ICD-10-CM

## 2018-09-13 LAB — COMPREHENSIVE METABOLIC PANEL
ALT: 16 U/L (ref 0–44)
AST: 31 U/L (ref 15–41)
Albumin: 4.1 g/dL (ref 3.5–5.0)
Alkaline Phosphatase: 48 U/L (ref 38–126)
Anion gap: 9 (ref 5–15)
BUN: 18 mg/dL (ref 8–23)
CO2: 27 mmol/L (ref 22–32)
Calcium: 9.5 mg/dL (ref 8.9–10.3)
Chloride: 98 mmol/L (ref 98–111)
Creatinine, Ser: 1.39 mg/dL — ABNORMAL HIGH (ref 0.44–1.00)
GFR calc Af Amer: 40 mL/min — ABNORMAL LOW (ref >60)
GFR calc non Af Amer: 34 mL/min — ABNORMAL LOW (ref >60)
Glucose, Bld: 107 mg/dL — ABNORMAL HIGH (ref 70–99)
Potassium: 3.9 mmol/L (ref 3.5–5.1)
Sodium: 134 mmol/L — ABNORMAL LOW (ref 135–145)
Total Bilirubin: 0.4 mg/dL (ref 0.3–1.2)
Total Protein: 8.1 g/dL (ref 6.5–8.1)

## 2018-09-13 LAB — CBC WITH DIFFERENTIAL/PLATELET
Abs Immature Granulocytes: 0.07 10*3/uL (ref 0.00–0.07)
Basophils Absolute: 0 10*3/uL (ref 0.0–0.1)
Basophils Relative: 0 %
Eosinophils Absolute: 0.2 10*3/uL (ref 0.0–0.5)
Eosinophils Relative: 2 %
HCT: 33.6 % — ABNORMAL LOW (ref 36.0–46.0)
Hemoglobin: 10.8 g/dL — ABNORMAL LOW (ref 12.0–15.0)
Immature Granulocytes: 1 %
Lymphocytes Relative: 42 %
Lymphs Abs: 4.5 10*3/uL — ABNORMAL HIGH (ref 0.7–4.0)
MCH: 27.6 pg (ref 26.0–34.0)
MCHC: 32.1 g/dL (ref 30.0–36.0)
MCV: 85.9 fL (ref 80.0–100.0)
Monocytes Absolute: 1.1 10*3/uL — ABNORMAL HIGH (ref 0.1–1.0)
Monocytes Relative: 10 %
Neutro Abs: 4.9 10*3/uL (ref 1.7–7.7)
Neutrophils Relative %: 45 %
Platelets: 260 10*3/uL (ref 150–400)
RBC: 3.91 MIL/uL (ref 3.87–5.11)
RDW: 14.8 % (ref 11.5–15.5)
WBC: 10.9 10*3/uL — ABNORMAL HIGH (ref 4.0–10.5)
nRBC: 0 % (ref 0.0–0.2)

## 2018-09-13 MED ORDER — LIDOCAINE HCL (PF) 1 % IJ SOLN
INTRAMUSCULAR | Status: AC
  Filled 2018-09-13: qty 5

## 2018-09-13 MED ORDER — LIDOCAINE HCL (PF) 1 % IJ SOLN
5.0000 mL | Freq: Once | INTRAMUSCULAR | Status: AC
  Administered 2018-09-13: 23:00:00 5 mL

## 2018-09-13 NOTE — ED Triage Notes (Signed)
Pt fell from standing position striking posterior skull. Laceration to posterior skull. Pt states "my legs just fell like they won't work right". Pt also complains of "feeling foggy headed". Pt denies LOC.

## 2018-09-13 NOTE — ED Notes (Signed)
Rigid c collar placed, cms intact to extremities before and after placement.

## 2018-09-13 NOTE — ED Notes (Signed)
Attempt to call pt's graddaughter kylee for ride home as previously discussed with granddaughter. Phone goes immediately to full voicemail.

## 2018-09-13 NOTE — ED Notes (Signed)
Pt assisted with urination on bedpan.

## 2018-09-13 NOTE — ED Notes (Signed)
Pt updated on delay. Pt verbalizes understanding.  

## 2018-09-13 NOTE — ED Triage Notes (Signed)
Pt is on xarelto. 

## 2018-09-13 NOTE — ED Notes (Signed)
Patient transported to CT 

## 2018-09-13 NOTE — ED Notes (Signed)
Pt assisted with bedpan for urination.

## 2018-09-13 NOTE — ED Notes (Signed)
Rigid c collar removed with MD consent for negative CT c spine.

## 2018-09-13 NOTE — ED Provider Notes (Addendum)
[facility] Emergency Department Provider Note   ____________________________________________    I have reviewed the triage vital signs and the nursing notes.   HISTORY  Chief Complaint Fall     HPI Lauren Mccann is a 85 y.o. female who is on Xarelto for history of blood clot who presents after a fall with a head injury.  Patient reports she lost her balance and fell backwards striking the back of her head and also stretching her neck.  She complains of some mild pain in her shoulders bilaterally but otherwise reports chronic low back pain which is unchanged.  No lower extremity injuries.  No abdominal pain.  No chest pain.  No shortness of breath.  No fevers or chills.  No nausea or vomiting or neuro deficits   Past Medical History:  Diagnosis Date  . A-fib ([place])   . Anemia   . Anxiety   . Arthritis    neck, back, knees  . Breast cancer ([place]) 03/11/2014   Overview:  S/p right mastectomy.  Reoccurrence s/p left matstectomy.  . Cancer ([place])    breast  . Closed fracture of neck of left femur with routine healing 08/07/2014  . Depression   . Dyspnea    easily  . Fibromyalgia   . GERD (gastroesophageal reflux disease)   . Headache    everyday  . Hip fracture requiring operative repair ([place]) 07/04/2014  . History of neck problems    shots in neck every three months  . Hypertension   . Hyperthyroidism   . IBS (irritable bowel syndrome)   . Neuropathy   . Osteoporosis   . Pulmonary embolism ([place])    H/O  . Stroke [facility])    in past no residual effects  . Thyroid disease   . Tremor of both hands   . Vertigo    worse in past    Patient Active Problem List   Diagnosis Date Noted  . Diarrhea 07/29/2016  . Occipital neuralgia (Location of Primary Source of Pain) (Bilateral) (R>L) 11/29/2015  . Chronic knee pain (Bilateral) (R>L) 11/29/2015  . Chronic abdominal pain (RLQ) 11/29/2015  . 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 Chronic  anticoagulation with Xarelto (stroke/pulmonary embolism) 11/29/2015  . History of breast cancer 11/29/2015  . Hyperlipidemia 11/28/2015  . Chronic pain 11/28/2015  . Long term current use of opiate analgesic 11/28/2015  . Long term prescription opiate use 11/28/2015  . Opiate use (15 MME/Day) 11/28/2015  . Long term prescription benzodiazepine use 11/28/2015  . Unilateral occipital headache (Right) 11/28/2015  . Multilevel spine pain (Location of Tertiary source of pain) (lumbar > thoracic) 11/28/2015  . Encounter for therapeutic drug level monitoring 11/28/2015  . Encounter for pain management planning 11/28/2015  . Chronic neck pain (Location of Secondary source of pain) (Bilateral) (R>L) 11/28/2015  . Cervical spine pain 11/28/2015  . Thoracic spine pain 11/28/2015  . Lumbar spine pain 11/28/2015  . Itching 10/17/2015  . Urinary incontinence in female 10/17/2015  . Pure hypercholesterolemia 07/12/2015  . Fibromyalgia 02/08/2015  . 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 Neuroendocrine tumor 02/08/2015  . Anemia, iron deficiency 11/01/2014  . Osteoarthritis of knee (Bilateral) (R>L) 05/23/2014  . Acquired hypothyroidism 05/14/2014  . Benign essential HTN 05/14/2014  . Anxiety and depression 05/14/2014  . Malignant neoplasm of breast ([place]) 03/11/2014  . 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 Pulmonary embolism ([place]) 03/10/2014  . Depression 03/10/2014    Past Surgical History:  Procedure Laterality Date  .  ABDOMINAL HYSTERECTOMY    . CATARACT EXTRACTION    . CATARACT EXTRACTION W/PHACO Right 08/18/2017   Procedure: CATARACT EXTRACTION PHACO AND INTRAOCULAR LENS PLACEMENT ([place]) right;  Surgeon: Eulogio Bear, MD;  Location: [facility];  Service: Ophthalmology;  Laterality: Right;  . CHOLECYSTECTOMY    . COLON SURGERY     resection  . HIP PINNING,CANNULATED Left 07/03/2014   Procedure: CANNULATED HIP PINNING;  Surgeon: Claud Kelp, MD;  Location: [facility] ORS;  Service:  Orthopedics;  Laterality: Left;  Marland Kitchen MASTECTOMY Bilateral   . VENA CAVA FILTER PLACEMENT      Prior to Admission medications   Medication Sig Start Date End Date Taking? Authorizing Provider  acetaminophen (TYLENOL) 325 MG tablet Take 650 mg by mouth every 4 (four) hours as needed.    [provider]  acetaminophen (TYLENOL) 500 MG tablet Take 2 tablets (1,000 mg total) by mouth every 8 (eight) hours as needed for headache. 03/27/15   Eula Listen, MD  ALPRAZolam Duanne Moron) 1 MG tablet Take half tablet twice daily as needed./ pm 01/30/15   [provider]  baclofen (LIORESAL) 10 MG tablet as needed.  09/28/15   [provider]  benzonatate (TESSALON) 200 MG capsule Take one cap TID PRN cough 07/01/17   Lorin Picket, PA-C  clonazePAM (KLONOPIN) 1 MG tablet Take 1 mg by mouth daily.     [provider]  diltiazem (CARDIZEM CD) 120 MG 24 hr capsule TAKE ONE CAPSULE DAILY/ am 10/30/15   [provider]  diphenoxylate-atropine (LOMOTIL) 2.5-0.025 MG tablet Take by mouth 4 (four) times daily as needed.     [provider]  doxycycline (VIBRAMYCIN) 100 MG capsule Take 1 capsule (100 mg total) by mouth 2 (two) times daily. Patient not taking: Reported on 08/14/2017 07/01/17   Crecencio Mc P, PA-C  gabapentin (NEURONTIN) 100 MG capsule Take 2 capsules by mouth 2 (two) times daily. Am and pm 05/12/14   [provider]  levothyroxine (SYNTHROID, LEVOTHROID) 25 MCG tablet TAKE ONE TABLET DAILY ON EMPTY STOMACH WITH GLASS OF WATER AT LEAST 30-60 MINUTES BEFORE BREAKFAST 01/25/15   [provider]  lidocaine (ASPERCREME W/LIDOCAINE) 4 % cream Apply 1 application topically as needed.    [provider]  loperamide (IMODIUM) 2 MG capsule Take by mouth daily.    [provider]  Multiple Vitamin (MULTI-VITAMIN DAILY PO) Take by mouth daily.    [provider]  ondansetron (ZOFRAN) 4 MG tablet Take 1 tablet by  mouth. Every 4 to 6 hours as needed for nausea, vomiting 01/04/14   [provider]  rivaroxaban (XARELTO) 20 MG TABS tablet Take 1 tablet (20 mg total) by mouth daily. Patient taking differently: Take 20 mg by mouth daily. 630 pm 07/06/14   Loletha Grayer, MD  sertraline (ZOLOFT) 100 MG tablet Take 1 tablet by mouth daily. 04/13/14   [provider]  tiZANidine (ZANAFLEX) 2 MG tablet every 6 (six) hours as needed.  12/28/14   [provider]  traMADol (ULTRAM) 50 MG tablet Take by mouth every 6 (six) hours as needed.    [provider]  triamcinolone (KENALOG) 0.025 % cream Apply 1 application topically 2 (two) times daily. Patient taking differently: Apply 1 application topically as needed.  07/01/17   Lorin Picket, PA-C  vitamin B-12 (CYANOCOBALAMIN) 1000 MCG tablet Take 2,000 mcg by mouth daily.     [provider]     Allergies Codeine, Propoxyphene, Alendronate  sodium, Atorvastatin, Benadryl [diphenhydramine hcl], Cefuroxime axetil, Cymbalta [duloxetine hcl], Diphenhydramine, Lipitor [atorvastatin calcium], Prednisone, and Levofloxacin  Family History  Problem Relation Age of Onset  . Cancer Father        Bone  . Kidney cancer Neg Hx   . Prostate cancer Neg Hx   . Bladder Cancer Neg Hx     Social History Social History   Tobacco Use  . Smoking status: Never Smoker  . Smokeless tobacco: Never Used  Substance Use Topics  . Alcohol use: No  . Drug use: No    Review of Systems  Constitutional: No fever/chills Eyes: No visual changes.  ENT: As above Cardiovascular: Denies chest pain. Respiratory: Denies shortness of breath. Gastrointestinal: As above Genitourinary: Negative for dysuria. Musculoskeletal: As above Skin: Laceration to the scalp Neurological: Negative forweakness   ____________________________________________   PHYSICAL EXAM:  VITAL SIGNS: ED Triage Vitals  Enc Vitals Group     BP 09/13/18 1901 132/73      Pulse Rate 09/13/18 1901 84     Resp 09/13/18 1901 16     Temp 09/13/18 1901 97.9 F (36.6 C)     Temp Source 09/13/18 1901 Oral     SpO2 09/13/18 1901 94 %     Weight 09/13/18 1902 66.2 kg (146 lb)     Height 09/13/18 1902 1.575 m (5\' 2" )     Head Circumference --      Peak Flow --      Pain Score 09/13/18 1901 9     Pain Loc --      Pain Edu? --      Excl. in [place]? --     Constitutional: Alert and oriented.  Eyes: Conjunctivae are normal.  Head: 3 cm vertical laceration, bleeding controlled, posterior scalp centrally Nose: No congestion/rhinnorhea. Mouth/Throat: Mucous membranes are moist.   Neck: No vertebral tenderness palpation, mild discomfort with range of motion, will apply c-collar Cardiovascular: Normal rate, regular rhythm. Grossly normal heart sounds.  Good peripheral circulation. Respiratory: Normal respiratory effort.  No retractions. Lungs CTAB. Gastrointestinal: Soft and nontender. No distention.    Musculoskeletal: No lower extremity tenderness nor edema.  Warm and well perfused.  Normal range of motion of all extremities Neurologic:  Normal speech and language. No gross focal neurologic deficits are appreciated.  Skin:  Skin is warm, dry Psychiatric: Mood and affect are normal. Speech and behavior are normal.  ____________________________________________   LABS (all labs ordered are listed, but only abnormal results are displayed)  Labs Reviewed  CBC WITH DIFFERENTIAL/PLATELET - Abnormal; Notable for the following components:      Result Value   WBC 10.9 (*)    Hemoglobin 10.8 (*)    HCT 33.6 (*)    Lymphs Abs 4.5 (*)    Monocytes Absolute 1.1 (*)    All other components within normal limits  COMPREHENSIVE METABOLIC PANEL - Abnormal; Notable for the following components:   Sodium 134 (*)    Glucose, Bld 107 (*)    Creatinine, Ser 1.39 (*)    GFR calc non Af Amer 34 (*)    GFR calc Af Amer 40 (*)    All other components within normal limits    ____________________________________________  EKG  ED ECG REPORT I, Lavonia Drafts, the attending physician, personally viewed and interpreted this ECG.  Date: 09/13/2018  Rhythm: normal sinus rhythm QRS Axis: normal Intervals: normal ST/T Wave abnormalities: normal Narrative Interpretation: no evidence of acute ischemia  ____________________________________________  RADIOLOGY  CT head cervical spine ____________________________________________   PROCEDURES  Procedure(s) performed: No  ..Laceration Repair  Date/Time: 09/13/2018 10:15 PM Performed by: Lavonia Drafts, MD Authorized by: Lavonia Drafts, MD   Consent:    Consent obtained:  Verbal   Consent given by:  Patient   Risks discussed:  Infection, pain and poor wound healing   Alternatives discussed:  No treatment Anesthesia (see MAR for exact dosages):    Anesthesia method:  Local infiltration   Local anesthetic:  Lidocaine 1% WITH epi Laceration details:    Location:  Scalp   Scalp location:  Crown   Length (cm):  3 Repair type:    Repair type:  Simple Treatment:    Amount of cleaning:  Standard   Irrigation solution:  Sterile saline   Irrigation method:  Syringe Skin repair:    Repair method:  Staples   Number of staples:  2 Approximation:    Approximation:  Close Post-procedure details:    Patient tolerance of procedure:  Tolerated well, no immediate complications     Critical Care performed: No ____________________________________________   INITIAL IMPRESSION / ASSESSMENT AND PLAN / ED COURSE  Pertinent labs & imaging results that were available during my care of the patient were reviewed by me and considered in my medical decision making (see chart for details).  Patient presents after a fall from standing, no dizziness lightheaded or neuro deficits.  No chest pain.  Pending imaging, will check CBC CMP   Imaging is reassuring, lab work is unremarkable, wound repaired with  staples,    ____________________________________________   FINAL CLINICAL IMPRESSION(S) / ED DIAGNOSES  Final diagnoses:  Fall, initial encounter  Injury of head, initial encounter  Laceration of scalp, initial encounter        Note:  This document was prepared using Dragon voice recognition software and may include unintentional dictation errors.   Lavonia Drafts, MD 09/13/18 2215    Lavonia Drafts, MD 09/13/18 2216

## 2019-04-28 ENCOUNTER — Inpatient Hospital Stay
Admission: EM | Admit: 2019-04-28 | Discharge: 2019-04-30 | DRG: 812 | Disposition: A | Discharge status: Home Health | Payer: Medicare Other | Attending: Internal Medicine | Admitting: Internal Medicine

## 2019-04-28 ENCOUNTER — Other Ambulatory Visit: Payer: Self-pay

## 2019-04-28 ENCOUNTER — Emergency Department: Payer: Medicare Other

## 2019-04-28 DIAGNOSIS — M47819 Spondylosis without myelopathy or radiculopathy, site unspecified: Secondary | ICD-10-CM | POA: Diagnosis present

## 2019-04-28 DIAGNOSIS — Z7901 Long term (current) use of anticoagulants: Secondary | ICD-10-CM

## 2019-04-28 DIAGNOSIS — Z86711 Personal history of pulmonary embolism: Secondary | ICD-10-CM

## 2019-04-28 DIAGNOSIS — Z885 Allergy status to narcotic agent status: Secondary | ICD-10-CM

## 2019-04-28 DIAGNOSIS — M47812 Spondylosis without myelopathy or radiculopathy, cervical region: Secondary | ICD-10-CM | POA: Diagnosis present

## 2019-04-28 DIAGNOSIS — Z79890 Hormone replacement therapy (postmenopausal): Secondary | ICD-10-CM

## 2019-04-28 DIAGNOSIS — D509 Iron deficiency anemia, unspecified: Principal | ICD-10-CM | POA: Diagnosis present

## 2019-04-28 DIAGNOSIS — Z9841 Cataract extraction status, right eye: Secondary | ICD-10-CM

## 2019-04-28 DIAGNOSIS — M170 Bilateral primary osteoarthritis of knee: Secondary | ICD-10-CM | POA: Diagnosis present

## 2019-04-28 DIAGNOSIS — I2699 Other pulmonary embolism without acute cor pulmonale: Secondary | ICD-10-CM | POA: Diagnosis not present

## 2019-04-28 DIAGNOSIS — D3A8 Other benign neuroendocrine tumors: Secondary | ICD-10-CM | POA: Diagnosis present

## 2019-04-28 DIAGNOSIS — Z808 Family history of malignant neoplasm of other organs or systems: Secondary | ICD-10-CM

## 2019-04-28 DIAGNOSIS — I480 Paroxysmal atrial fibrillation: Secondary | ICD-10-CM | POA: Diagnosis present

## 2019-04-28 DIAGNOSIS — D649 Anemia, unspecified: Secondary | ICD-10-CM | POA: Diagnosis not present

## 2019-04-28 DIAGNOSIS — M797 Fibromyalgia: Secondary | ICD-10-CM | POA: Diagnosis present

## 2019-04-28 DIAGNOSIS — Z85060 Personal history of malignant carcinoid tumor of small intestine: Secondary | ICD-10-CM | POA: Diagnosis not present

## 2019-04-28 DIAGNOSIS — F418 Other specified anxiety disorders: Secondary | ICD-10-CM | POA: Diagnosis present

## 2019-04-28 DIAGNOSIS — Z95828 Presence of other vascular implants and grafts: Secondary | ICD-10-CM | POA: Diagnosis not present

## 2019-04-28 DIAGNOSIS — K580 Irritable bowel syndrome with diarrhea: Secondary | ICD-10-CM | POA: Diagnosis present

## 2019-04-28 DIAGNOSIS — Z90710 Acquired absence of both cervix and uterus: Secondary | ICD-10-CM

## 2019-04-28 DIAGNOSIS — Z79899 Other long term (current) drug therapy: Secondary | ICD-10-CM

## 2019-04-28 DIAGNOSIS — E538 Deficiency of other specified B group vitamins: Secondary | ICD-10-CM

## 2019-04-28 DIAGNOSIS — M47814 Spondylosis without myelopathy or radiculopathy, thoracic region: Secondary | ICD-10-CM | POA: Diagnosis present

## 2019-04-28 DIAGNOSIS — Z881 Allergy status to other antibiotic agents status: Secondary | ICD-10-CM

## 2019-04-28 DIAGNOSIS — E876 Hypokalemia: Secondary | ICD-10-CM | POA: Diagnosis present

## 2019-04-28 DIAGNOSIS — Z20822 Contact with and (suspected) exposure to covid-19: Secondary | ICD-10-CM | POA: Diagnosis present

## 2019-04-28 DIAGNOSIS — K649 Unspecified hemorrhoids: Secondary | ICD-10-CM | POA: Diagnosis present

## 2019-04-28 DIAGNOSIS — Z9013 Acquired absence of bilateral breasts and nipples: Secondary | ICD-10-CM

## 2019-04-28 DIAGNOSIS — N390 Urinary tract infection, site not specified: Secondary | ICD-10-CM | POA: Diagnosis present

## 2019-04-28 DIAGNOSIS — E340 Carcinoid syndrome: Secondary | ICD-10-CM | POA: Diagnosis present

## 2019-04-28 DIAGNOSIS — Z9049 Acquired absence of other specified parts of digestive tract: Secondary | ICD-10-CM | POA: Diagnosis not present

## 2019-04-28 DIAGNOSIS — B9620 Unspecified Escherichia coli [E. coli] as the cause of diseases classified elsewhere: Secondary | ICD-10-CM | POA: Diagnosis present

## 2019-04-28 DIAGNOSIS — R710 Precipitous drop in hematocrit: Secondary | ICD-10-CM | POA: Diagnosis present

## 2019-04-28 DIAGNOSIS — M47816 Spondylosis without myelopathy or radiculopathy, lumbar region: Secondary | ICD-10-CM | POA: Diagnosis present

## 2019-04-28 DIAGNOSIS — R5383 Other fatigue: Secondary | ICD-10-CM | POA: Diagnosis not present

## 2019-04-28 DIAGNOSIS — Z853 Personal history of malignant neoplasm of breast: Secondary | ICD-10-CM

## 2019-04-28 DIAGNOSIS — Z79891 Long term (current) use of opiate analgesic: Secondary | ICD-10-CM

## 2019-04-28 DIAGNOSIS — M810 Age-related osteoporosis without current pathological fracture: Secondary | ICD-10-CM | POA: Diagnosis present

## 2019-04-28 DIAGNOSIS — Z888 Allergy status to other drugs, medicaments and biological substances status: Secondary | ICD-10-CM

## 2019-04-28 DIAGNOSIS — Z86010 Personal history of colonic polyps: Secondary | ICD-10-CM

## 2019-04-28 DIAGNOSIS — Z961 Presence of intraocular lens: Secondary | ICD-10-CM | POA: Diagnosis present

## 2019-04-28 DIAGNOSIS — Z86718 Personal history of other venous thrombosis and embolism: Secondary | ICD-10-CM

## 2019-04-28 DIAGNOSIS — I10 Essential (primary) hypertension: Secondary | ICD-10-CM | POA: Diagnosis present

## 2019-04-28 DIAGNOSIS — E039 Hypothyroidism, unspecified: Secondary | ICD-10-CM | POA: Diagnosis present

## 2019-04-28 DIAGNOSIS — R197 Diarrhea, unspecified: Secondary | ICD-10-CM | POA: Diagnosis not present

## 2019-04-28 DIAGNOSIS — Z8673 Personal history of transient ischemic attack (TIA), and cerebral infarction without residual deficits: Secondary | ICD-10-CM

## 2019-04-28 LAB — ABO/RH: ABO/RH(D): A POS

## 2019-04-28 LAB — IRON AND TIBC
Iron: 9 ug/dL — ABNORMAL LOW (ref 28–170)
Saturation Ratios: 2 % — ABNORMAL LOW (ref 10.4–31.8)
TIBC: 462 ug/dL — ABNORMAL HIGH (ref 250–450)
UIBC: 453 ug/dL

## 2019-04-28 LAB — CBC
HCT: 23.4 % — ABNORMAL LOW (ref 36.0–46.0)
Hemoglobin: 6.7 g/dL — ABNORMAL LOW (ref 12.0–15.0)
MCH: 22.1 pg — ABNORMAL LOW (ref 26.0–34.0)
MCHC: 28.6 g/dL — ABNORMAL LOW (ref 30.0–36.0)
MCV: 77.2 fL — ABNORMAL LOW (ref 80.0–100.0)
Platelets: 389 10*3/uL (ref 150–400)
RBC: 3.03 MIL/uL — ABNORMAL LOW (ref 3.87–5.11)
RDW: 15.6 % — ABNORMAL HIGH (ref 11.5–15.5)
WBC: 8.7 10*3/uL (ref 4.0–10.5)
nRBC: 0 % (ref 0.0–0.2)

## 2019-04-28 LAB — URINALYSIS, COMPLETE (UACMP) WITH MICROSCOPIC
Bilirubin Urine: NEGATIVE
Glucose, UA: NEGATIVE mg/dL
Hgb urine dipstick: NEGATIVE
Ketones, ur: NEGATIVE mg/dL
Leukocytes,Ua: NEGATIVE
Nitrite: POSITIVE — AB
Protein, ur: NEGATIVE mg/dL
Specific Gravity, Urine: 1.021 (ref 1.005–1.030)
pH: 5 (ref 5.0–8.0)

## 2019-04-28 LAB — HEMOGLOBIN AND HEMATOCRIT, BLOOD
HCT: 25.3 % — ABNORMAL LOW (ref 36.0–46.0)
Hemoglobin: 7.8 g/dL — ABNORMAL LOW (ref 12.0–15.0)

## 2019-04-28 LAB — RETICULOCYTES
Immature Retic Fract: 16.4 % — ABNORMAL HIGH (ref 2.3–15.9)
RBC.: 2.89 MIL/uL — ABNORMAL LOW (ref 3.87–5.11)
Retic Count, Absolute: 32.9 10*3/uL (ref 19.0–186.0)
Retic Ct Pct: 1.1 % (ref 0.4–3.1)

## 2019-04-28 LAB — VITAMIN B12: Vitamin B-12: 269 pg/mL (ref 180–914)

## 2019-04-28 LAB — BASIC METABOLIC PANEL
Anion gap: 11 (ref 5–15)
BUN: 16 mg/dL (ref 8–23)
CO2: 23 mmol/L (ref 22–32)
Calcium: 9.2 mg/dL (ref 8.9–10.3)
Chloride: 103 mmol/L (ref 98–111)
Creatinine, Ser: 1 mg/dL (ref 0.44–1.00)
GFR calc Af Amer: 59 mL/min — ABNORMAL LOW (ref >60)
GFR calc non Af Amer: 51 mL/min — ABNORMAL LOW (ref >60)
Glucose, Bld: 108 mg/dL — ABNORMAL HIGH (ref 70–99)
Potassium: 3.4 mmol/L — ABNORMAL LOW (ref 3.5–5.1)
Sodium: 137 mmol/L (ref 135–145)

## 2019-04-28 LAB — FERRITIN: Ferritin: 4 ng/mL — ABNORMAL LOW (ref 11–307)

## 2019-04-28 LAB — FOLATE: Folate: 32 ng/mL (ref >5.9)

## 2019-04-28 LAB — PREPARE RBC (CROSSMATCH)

## 2019-04-28 LAB — LACTATE DEHYDROGENASE: LDH: 126 U/L (ref 98–192)

## 2019-04-28 MED ORDER — ACETAMINOPHEN 650 MG RE SUPP: 650.0000 mg | Freq: Four times a day (QID) | RECTAL | Status: DC | PRN

## 2019-04-28 MED ORDER — SERTRALINE HCL 50 MG PO TABS
150.0000 mg | ORAL_TABLET | Freq: Every day | ORAL | Status: DC
  Administered 2019-04-29 – 2019-04-30 (×2): 150 mg via ORAL
  Filled 2019-04-28 (×2): qty 3

## 2019-04-28 MED ORDER — SODIUM CHLORIDE 0.9% IV SOLUTION
Freq: Once | INTRAVENOUS | Status: DC
  Filled 2019-04-28: qty 250

## 2019-04-28 MED ORDER — LEVOTHYROXINE SODIUM 50 MCG PO TABS
25.0000 ug | ORAL_TABLET | Freq: Every day | ORAL | Status: DC
  Administered 2019-04-30: 25 ug via ORAL
  Filled 2019-04-28: qty 1

## 2019-04-28 MED ORDER — TRAZODONE HCL 50 MG PO TABS
25.0000 mg | ORAL_TABLET | Freq: Every evening | ORAL | Status: DC | PRN
  Administered 2019-04-29: 22:00:00 25 mg via ORAL
  Filled 2019-04-28: qty 1

## 2019-04-28 MED ORDER — ACETAMINOPHEN 325 MG PO TABS
650.0000 mg | ORAL_TABLET | Freq: Four times a day (QID) | ORAL | Status: DC | PRN
  Administered 2019-04-29 (×2): 650 mg via ORAL
  Filled 2019-04-28 (×2): qty 2

## 2019-04-28 MED ORDER — ACETAMINOPHEN 325 MG PO TABS
650.0000 mg | ORAL_TABLET | Freq: Once | ORAL | Status: AC
  Administered 2019-04-28: 650 mg via ORAL
  Filled 2019-04-28: qty 2

## 2019-04-28 MED ORDER — ALPRAZOLAM 0.5 MG PO TABS
0.5000 mg | ORAL_TABLET | Freq: Two times a day (BID) | ORAL | Status: DC | PRN
  Administered 2019-04-28 – 2019-04-30 (×2): 0.5 mg via ORAL
  Filled 2019-04-28 (×2): qty 1

## 2019-04-28 MED ORDER — LOPERAMIDE HCL 2 MG PO CAPS
2.0000 mg | ORAL_CAPSULE | Freq: Once | ORAL | Status: AC
  Administered 2019-04-28: 2 mg via ORAL
  Filled 2019-04-28: qty 1

## 2019-04-28 MED ORDER — POTASSIUM CHLORIDE IN NACL 20-0.9 MEQ/L-% IV SOLN
INTRAVENOUS | Status: DC
  Filled 2019-04-28 (×6): qty 1000

## 2019-04-28 MED ORDER — METOPROLOL SUCCINATE ER 50 MG PO TB24
50.0000 mg | ORAL_TABLET | Freq: Every day | ORAL | Status: DC
  Administered 2019-04-29 – 2019-04-30 (×2): 50 mg via ORAL
  Filled 2019-04-28 (×2): qty 1

## 2019-04-28 MED ORDER — POTASSIUM CHLORIDE 20 MEQ PO PACK
40.0000 meq | PACK | Freq: Once | ORAL | Status: AC
  Administered 2019-04-28: 23:00:00 40 meq via ORAL
  Filled 2019-04-28: qty 2

## 2019-04-28 MED ORDER — LOPERAMIDE HCL 2 MG PO CAPS
2.0000 mg | ORAL_CAPSULE | Freq: Two times a day (BID) | ORAL | Status: DC | PRN
  Administered 2019-04-30: 11:00:00 2 mg via ORAL
  Filled 2019-04-28: qty 1

## 2019-04-28 MED ORDER — PANTOPRAZOLE SODIUM 40 MG PO TBEC
40.0000 mg | DELAYED_RELEASE_TABLET | Freq: Every day | ORAL | Status: DC
  Administered 2019-04-28 – 2019-04-30 (×3): 40 mg via ORAL
  Filled 2019-04-28 (×3): qty 1

## 2019-04-28 MED ORDER — SODIUM CHLORIDE 0.9% FLUSH: 3.0000 mL | Freq: Once | INTRAVENOUS | Status: DC

## 2019-04-28 MED ORDER — ONDANSETRON HCL 4 MG PO TABS: 4.0000 mg | ORAL_TABLET | Freq: Four times a day (QID) | ORAL | Status: DC | PRN

## 2019-04-28 MED ORDER — ONDANSETRON HCL 4 MG/2ML IJ SOLN
4.0000 mg | Freq: Four times a day (QID) | INTRAMUSCULAR | Status: DC | PRN
  Administered 2019-04-30: 10:00:00 4 mg via INTRAVENOUS
  Filled 2019-04-28: qty 2

## 2019-04-28 MED ORDER — SULFAMETHOXAZOLE-TRIMETHOPRIM 400-80 MG PO TABS
1.0000 | ORAL_TABLET | Freq: Two times a day (BID) | ORAL | Status: DC
  Administered 2019-04-28 – 2019-04-30 (×4): 1 via ORAL
  Filled 2019-04-28 (×5): qty 1

## 2019-04-28 MED ORDER — SODIUM CHLORIDE 0.9 % IV SOLN: 10.0000 mL/h | Freq: Once | INTRAVENOUS | Status: DC

## 2019-04-28 MED ORDER — FERROUS SULFATE 325 (65 FE) MG PO TABS
325.0000 mg | ORAL_TABLET | Freq: Three times a day (TID) | ORAL | Status: DC
  Administered 2019-04-28 – 2019-04-30 (×5): 325 mg via ORAL
  Filled 2019-04-28 (×7): qty 1

## 2019-04-28 NOTE — ED Provider Notes (Signed)
[facility] Emergency Department Provider Note    First MD Initiated Contact with Patient 04/28/19 Bosie Helper     (approximate)  I have reviewed the triage vital signs and the nursing notes.   HISTORY  Chief Complaint Weakness    HPI Lauren Mccann is a 86 y.o. female below listed past medical history on Xarelto for history of DVT PE presents to the ER for evaluation of anemia.  States that she been having shortness of breath generalized weakness and feeling "drunk "when she is up walking for the past several days.  She is not noted any melena or hematochezia.  States she does have a remote history of anemia but is not taking any medications for it.  Does have a history of chronic diarrhea status post colectomy.   Past Medical History:  Diagnosis Date  . A-fib ([place])   . Anemia   . Anxiety   . Arthritis    neck, back, knees  . Breast cancer ([place]) 03/11/2014   Overview:  S/p right mastectomy.  Reoccurrence s/p left matstectomy.  . Cancer ([place])    breast  . Closed fracture of neck of left femur with routine healing 08/07/2014  . Depression   . Dyspnea    easily  . Fibromyalgia   . GERD (gastroesophageal reflux disease)   . Headache    everyday  . Hip fracture requiring operative repair ([place]) 07/04/2014  . History of neck problems    shots in neck every three months  . Hypertension   . Hyperthyroidism   . IBS (irritable bowel syndrome)   . Neuropathy   . Osteoporosis   . Pulmonary embolism ([place])    H/O  . Stroke [facility])    in past no residual effects  . Thyroid disease   . Tremor of both hands   . Vertigo    worse in past   Family History  Problem Relation Age of Onset  . Cancer Father        Bone  . Kidney cancer Neg Hx   . Prostate cancer Neg Hx   . Bladder Cancer Neg Hx    Past Surgical History:  Procedure Laterality Date  . ABDOMINAL HYSTERECTOMY    . CATARACT EXTRACTION    . CATARACT EXTRACTION W/PHACO Right 08/18/2017   Procedure:  CATARACT EXTRACTION PHACO AND INTRAOCULAR LENS PLACEMENT ([place]) right;  Surgeon: Eulogio Bear, MD;  Location: Center;  Service: Ophthalmology;  Laterality: Right;  . CHOLECYSTECTOMY    . COLON SURGERY     resection  . HIP PINNING,CANNULATED Left 07/03/2014   Procedure: CANNULATED HIP PINNING;  Surgeon: Claud Kelp, MD;  Location: [facility] ORS;  Service: Orthopedics;  Laterality: Left;  Marland Kitchen MASTECTOMY Bilateral   . VENA CAVA FILTER PLACEMENT     Patient Active Problem List   Diagnosis Date Noted  . Symptomatic anemia 04/28/2019  . Diarrhea 07/29/2016  . Occipital neuralgia (Location of Primary Source of Pain) (Bilateral) (R>L) 11/29/2015  . Chronic knee pain (Bilateral) (R>L) 11/29/2015  . Chronic abdominal pain (RLQ) 11/29/2015  . 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 Chronic anticoagulation with Xarelto (stroke/pulmonary embolism) 11/29/2015  . History of breast cancer 11/29/2015  . Hyperlipidemia 11/28/2015  . Chronic pain 11/28/2015  . Long term current use of opiate analgesic 11/28/2015  . Long term prescription opiate use 11/28/2015  . Opiate use (15 MME/Day) 11/28/2015  . Long term prescription benzodiazepine use 11/28/2015  . Unilateral occipital headache (Right)  11/28/2015  . Multilevel spine pain (Location of Tertiary source of pain) (lumbar > thoracic) 11/28/2015  . Encounter for therapeutic drug level monitoring 11/28/2015  . Encounter for pain management planning 11/28/2015  . Chronic neck pain (Location of Secondary source of pain) (Bilateral) (R>L) 11/28/2015  . Cervical spine pain 11/28/2015  . Thoracic spine pain 11/28/2015  . Lumbar spine pain 11/28/2015  . Itching 10/17/2015  . Urinary incontinence in female 10/17/2015  . Pure hypercholesterolemia 07/12/2015  . Fibromyalgia 02/08/2015  . 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 Neuroendocrine tumor 02/08/2015  . Anemia, iron deficiency 11/01/2014  . Osteoarthritis of knee (Bilateral) (R>L) 05/23/2014   . Acquired hypothyroidism 05/14/2014  . Benign essential HTN 05/14/2014  . Anxiety and depression 05/14/2014  . Malignant neoplasm of breast ([place]) 03/11/2014  . 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 Pulmonary embolism ([place]) 03/10/2014  . Depression 03/10/2014      Prior to Admission medications   Medication Sig Start Date End Date Taking? Authorizing Provider  acetaminophen (TYLENOL) 325 MG tablet Take 650 mg by mouth every 4 (four) hours as needed.    [provider]  acetaminophen (TYLENOL) 500 MG tablet Take 2 tablets (1,000 mg total) by mouth every 8 (eight) hours as needed for headache. 03/27/15   Eula Listen, MD  ALPRAZolam Duanne Moron) 1 MG tablet Take half tablet twice daily as needed./ pm 01/30/15   [provider]  baclofen (LIORESAL) 10 MG tablet as needed.  09/28/15   [provider]  benzonatate (TESSALON) 200 MG capsule Take one cap TID PRN cough 07/01/17   Lorin Picket, PA-C  clonazePAM (KLONOPIN) 1 MG tablet Take 1 mg by mouth daily.     [provider]  diltiazem (CARDIZEM CD) 120 MG 24 hr capsule TAKE ONE CAPSULE DAILY/ am 10/30/15   [provider]  diphenoxylate-atropine (LOMOTIL) 2.5-0.025 MG tablet Take by mouth 4 (four) times daily as needed.     [provider]  doxycycline (VIBRAMYCIN) 100 MG capsule Take 1 capsule (100 mg total) by mouth 2 (two) times daily. Patient not taking: Reported on 08/14/2017 07/01/17   Crecencio Mc P, PA-C  gabapentin (NEURONTIN) 100 MG capsule Take 2 capsules by mouth 2 (two) times daily. Am and pm 05/12/14   [provider]  levothyroxine (SYNTHROID, LEVOTHROID) 25 MCG tablet TAKE ONE TABLET DAILY ON EMPTY STOMACH WITH GLASS OF WATER AT LEAST 30-60 MINUTES BEFORE BREAKFAST 01/25/15   [provider]  lidocaine (ASPERCREME W/LIDOCAINE) 4 % cream Apply 1 application topically as needed.    [provider]  loperamide (IMODIUM) 2 MG capsule Take  by mouth daily.    [provider]  Multiple Vitamin (MULTI-VITAMIN DAILY PO) Take by mouth daily.    [provider]  ondansetron (ZOFRAN) 4 MG tablet Take 1 tablet by mouth. Every 4 to 6 hours as needed for nausea, vomiting 01/04/14   [provider]  rivaroxaban (XARELTO) 20 MG TABS tablet Take 1 tablet (20 mg total) by mouth daily. Patient taking differently: Take 20 mg by mouth daily. 630 pm 07/06/14   Loletha Grayer, MD  sertraline (ZOLOFT) 100 MG tablet Take 1 tablet by mouth daily. 04/13/14   [provider]  tiZANidine (ZANAFLEX) 2 MG tablet every 6 (six) hours as needed.  12/28/14   [provider]  traMADol (ULTRAM) 50 MG tablet Take by mouth every 6 (six) hours as needed.    [provider]  triamcinolone (KENALOG) 0.025 % cream Apply 1 application  topically 2 (two) times daily. Patient taking differently: Apply 1 application topically as needed.  07/01/17   Lorin Picket, PA-C  vitamin B-12 (CYANOCOBALAMIN) 1000 MCG tablet Take 2,000 mcg by mouth daily.     [provider]    Allergies Codeine, Propoxyphene, Alendronate sodium, Atorvastatin, Benadryl [diphenhydramine hcl], Cefuroxime axetil, Cymbalta [duloxetine hcl], Diphenhydramine, Lipitor [atorvastatin calcium], Prednisone, and Levofloxacin    Social History Social History   Tobacco Use  . Smoking status: Never Smoker  . Smokeless tobacco: Never Used  Substance Use Topics  . Alcohol use: No  . Drug use: No    Review of Systems Patient denies headaches, rhinorrhea, blurry vision, numbness, shortness of breath, chest pain, edema, cough, abdominal pain, nausea, vomiting, diarrhea, dysuria, fevers, rashes or hallucinations unless otherwise stated above in HPI. ____________________________________________   PHYSICAL EXAM:  VITAL SIGNS: Vitals:   04/28/19 1735  BP: 125/76  Pulse: 84  Resp: 20  Temp: 98.2 F (36.8 C)  SpO2: 99%    Constitutional:  Alert and oriented. pale Eyes: Conjunctivae are normal.  Head: Atraumatic. Nose: No congestion/rhinnorhea. Mouth/Throat: Mucous membranes are moist.   Neck: No stridor. Painless ROM.  Cardiovascular: Normal rate, regular rhythm. Grossly normal heart sounds.  Good peripheral circulation. Respiratory: Normal respiratory effort.  No retractions. Lungs CTAB. Gastrointestinal: Soft and nontender. No distention. No abdominal bruits. No CVA tenderness. Genitourinary: guaiac negative brown stool, nonthrombosed nonbleeding hemorrhoid Musculoskeletal: No lower extremity tenderness nor edema.  No joint effusions. Neurologic:  Normal speech and language. No gross focal neurologic deficits are appreciated. No facial droop Skin:  Skin is warm, dry and intact. No rash noted. Psychiatric: Mood and affect are normal. Speech and behavior are normal.  ____________________________________________   LABS (all labs ordered are listed, but only abnormal results are displayed)  Results for orders placed or performed during the hospital encounter of 04/28/19 (from the past 24 hour(s))  Basic metabolic panel     Status: Abnormal   Collection Time: 04/28/19  5:45 PM  Result Value Ref Range   Sodium 137 135 - 145 mmol/L   Potassium 3.4 (L) 3.5 - 5.1 mmol/L   Chloride 103 98 - 111 mmol/L   CO2 23 22 - 32 mmol/L   Glucose, Bld 108 (H) 70 - 99 mg/dL   BUN 16 8 - 23 mg/dL   Creatinine, Ser 1.00 0.44 - 1.00 mg/dL   Calcium 9.2 8.9 - 10.3 mg/dL   GFR calc non Af Amer 51 (L) >60 mL/min   GFR calc Af Amer 59 (L) >60 mL/min   Anion gap 11 5 - 15  CBC     Status: Abnormal   Collection Time: 04/28/19  5:45 PM  Result Value Ref Range   WBC 8.7 4.0 - 10.5 K/uL   RBC 3.03 (L) 3.87 - 5.11 MIL/uL   Hemoglobin 6.7 (L) 12.0 - 15.0 g/dL   HCT 23.4 (L) 36.0 - 46.0 %   MCV 77.2 (L) 80.0 - 100.0 fL   MCH 22.1 (L) 26.0 - 34.0 pg   MCHC 28.6 (L) 30.0 - 36.0 g/dL   RDW 15.6 (H) 11.5 - 15.5 %   Platelets 389 150 - 400 K/uL    nRBC 0.0 0.0 - 0.2 %  Type and screen [facility]     Status: None   Collection Time: 04/28/19  5:45 PM  Result Value Ref Range   ABO/RH(D) A POS    Antibody Screen NEG    Sample Expiration  05/01/2019,2359 Performed at [facility], [address], [place], [place] [address]    ____________________________________________  EKG My review and personal interpretation at Time: 17:44   Indication: weakness  Rate: 80  Rhythm: sinus Axis: normal Other: nonspecific st abn, no stemi ____________________________________________  RADIOLOGY I personally reviewed all radiographic images ordered to evaluate for the above acute complaints and reviewed radiology reports and findings.  These findings were personally discussed with the patient.  Please see medical record for radiology report.   ____________________________________________   PROCEDURES  Procedure(s) performed:  .Critical Care Performed by: Merlyn Lot, MD Authorized by: Merlyn Lot, MD   Critical care provider statement:    Critical care time (minutes):  30   Critical care time was exclusive of:  Separately billable procedures and treating other patients   Critical care was necessary to treat or prevent imminent or life-threatening deterioration of the following conditions:  Circulatory failure   Critical care was time spent personally by me on the following activities:  Development of treatment plan with patient or surrogate, discussions with consultants, evaluation of patient's response to treatment, examination of patient, obtaining history from patient or surrogate, ordering and performing treatments and interventions, ordering and review of laboratory studies, ordering and review of radiographic studies, pulse oximetry, re-evaluation of patient's condition and review of old charts      Critical Care performed:  yes ____________________________________________   INITIAL IMPRESSION / [place] / ED COURSE  Pertinent labs & imaging results that were available during my care of the patient were reviewed by me and considered in my medical decision making (see chart for details).   DDX: dehydration, anemia, ugib, lgib, ida, anemia of chronic disease  Lauren Mccann is a 86 y.o. who presents to the ED with symptoms as described above.  Patient frail appearing with chronic medical conditions and acute symptoms as described above.  Does have evidence of acute anemia.  Does have a history of iron deficiency anemia.  She denies any melena or hematochezia no recent surgeries or trauma.  No hemoptysis or nosebleeds.  The patient will be placed on continuous pulse oximetry and telemetry for monitoring.  Laboratory evaluation will be sent to evaluate for the above complaints.     Clinical Course as of Apr 27 1953  Wed Apr 28, 2019  1931 Rectal exam with no melena or blood on exam.  1 nonthrombosed nonbleeding hemorrhoid.  Stool is brown and guaiac negative.   [PR]    Clinical Course User Index [PR] Merlyn Lot, MD   Will transfuse given her level of symptomatic anemia.  Case discussed with hospitalist who agrees with plan for admission.  Has requested adding on stool studies.  Have discussed with the patient and available family all diagnostics and treatments performed thus far and all questions were answered to the best of my ability. The patient demonstrates understanding and agreement with plan.   The patient was evaluated in Emergency Department today for the symptoms described in the history of present illness. He/she was evaluated in the context of the global COVID-19 pandemic, which necessitated consideration that the patient might be at risk for infection with the SARS-CoV-2 virus that causes COVID-19. Institutional protocols and algorithms that pertain to the evaluation of patients at  risk for COVID-19 are in a state of rapid change based on information released by regulatory bodies including the CDC and federal and state organizations. These policies and algorithms were followed during the patient's care in the  ED.  As part of my medical decision making, I reviewed the following data within the [facility] notes reviewed and incorporated, Labs reviewed, notes from prior ED visits and [place] Controlled Substance Database   ____________________________________________   FINAL CLINICAL IMPRESSION(S) / ED DIAGNOSES  Final diagnoses:  Symptomatic anemia      NEW MEDICATIONS STARTED DURING THIS VISIT:  New Prescriptions   No medications on file     Note:  This document was prepared using Dragon voice recognition software and may include unintentional dictation errors.    Merlyn Lot, MD 04/28/19 Karl Bales

## 2019-04-28 NOTE — H&P (Addendum)
[facility] [place] NAME: Lauren Mccann    MR#:  [id number]  DATE OF BIRTH:  Apr 14, 1930  DATE OF ADMISSION:  04/28/2019  PRIMARY CARE PHYSICIAN: Glendon Axe, MD   REQUESTING/REFERRING PHYSICIAN: Merlyn Lot, MD  CHIEF COMPLAINT:   Chief Complaint  Patient presents with  . Weakness    HISTORY OF PRESENT ILLNESS:  Lauren Mccann  is a 86 y.o. pleasant Caucasian female with a known history of multiple medical problems including PE and atrial fibrillation on Xarelto, presented to the emergency room with acute onset of generalized weakness with associated dyspnea on exertion and occasional dizziness which have been going on since before Christmas and have worsened the last few days.  She admitted to diarrhea on a chronic basis.  She would have to bowel movements before breakfast and 2-3 after breakfast unless she uses Imodium.  She admits to nausea without vomiting or abdominal pain.  She denies any chest pain or palpitations.  She admits to headache which has been chronic over the last year.  She admits to urinary frequency and urgency as well as dysuria and occasional bilateral flank pain.  She denies any melena or bright red bleeding per rectum.  No bilious vomitus or hematemesis.  Upon presentation to the emergency room, vital signs were within normal.  The patient had negative stool Hemoccult by the ER physician.  Labs revealed mild hypokalemia with potassium of 3.4 and severe anemia with hemoglobin of 6.7 hematocrit 23.4 compared to 10.8 and 33.6 on 09/13/2018 with low RBC indices.  Urinalysis showed positive nitrite and many bacteria with 0-5 WBCs and 0-5 RBCs.  He was positive for hyaline casts and calcium oxalate crystals. EKG showed normal sinus rhythm with a rate of 83 with poor R wave progression.  The patient was given Imodium and placed on normal saline at [facility].  She will be admitted to a medical monitored bed for further evaluation and  management. PAST MEDICAL HISTORY:   Past Medical History:  Diagnosis Date  . A-fib ([place])   . Anemia   . Anxiety   . Arthritis    neck, back, knees  . Breast cancer ([place]) 03/11/2014   Overview:  S/p right mastectomy.  Reoccurrence s/p left matstectomy.  . Cancer ([place])    breast  . Closed fracture of neck of left femur with routine healing 08/07/2014  . Depression   . Dyspnea    easily  . Fibromyalgia   . GERD (gastroesophageal reflux disease)   . Headache    everyday  . Hip fracture requiring operative repair ([place]) 07/04/2014  . History of neck problems    shots in neck every three months  . Hypertension   . Hyperthyroidism   . IBS (irritable bowel syndrome)   . Neuropathy   . Osteoporosis   . Pulmonary embolism ([place])    H/O  . Stroke [facility])    in past no residual effects  . Thyroid disease   . Tremor of both hands   . Vertigo    worse in past    PAST SURGICAL HISTORY:   Past Surgical History:  Procedure Laterality Date  . ABDOMINAL HYSTERECTOMY    . CATARACT EXTRACTION    . CATARACT EXTRACTION W/PHACO Right 08/18/2017   Procedure: CATARACT EXTRACTION PHACO AND INTRAOCULAR LENS PLACEMENT ([place]) right;  Surgeon: Eulogio Bear, MD;  Location: [facility];  Service: Ophthalmology;  Laterality: Right;  . CHOLECYSTECTOMY    . COLON SURGERY  resection  . HIP PINNING,CANNULATED Left 07/03/2014   Procedure: CANNULATED HIP PINNING;  Surgeon: Claud Kelp, MD;  Location: [facility];  Service: Orthopedics;  Laterality: Left;  Marland Kitchen MASTECTOMY Bilateral   . VENA CAVA FILTER PLACEMENT      SOCIAL HISTORY:   Social History   Tobacco Use  . Smoking status: Never Smoker  . Smokeless tobacco: Never Used  Substance Use Topics  . Alcohol use: No    FAMILY HISTORY:   Family History  Problem Relation Age of Onset  . Cancer Father        Bone  . Kidney cancer Neg Hx   . Prostate cancer Neg Hx   . Bladder Cancer Neg Hx     DRUG ALLERGIES:   Allergies   Allergen Reactions  . Codeine Shortness Of Breath and Other (See Comments)    GI Upset  Pt states "makes heart flutter and trouble breathing" Other reaction(s): Other (See Comments) Chest Tightness, SOB, Tachycardia GI Upset  Pt states "makes heart flutter and trouble breathing" Other reaction(s): Other (See Comments) Chest Tightness, SOB, Tachycardia GI Upset  Pt states "makes heart flutter and trouble breathing"  . Propoxyphene Shortness Of Breath    Other reaction(s): Other (See Comments) GI Upset  . Alendronate Sodium     Other reaction(s): Other (See Comments) GI Upset  . Atorvastatin     Other reaction(s): Other (See Comments) Myalgia  . Benadryl [Diphenhydramine Hcl] Other (See Comments)    Restless legs  . Cefuroxime Axetil Nausea Only  . Cymbalta [Duloxetine Hcl]   . Diphenhydramine   . Lipitor [Atorvastatin Calcium]   . Prednisone     Other reaction(s): Other (See Comments) Chest Pain, SOB, "can't breathe good" Unable to sleep  . Levofloxacin Rash and Other (See Comments)    Altered mental status    REVIEW OF SYSTEMS:   ROS As per history of present illness. All pertinent systems were reviewed above. Constitutional,  HEENT, cardiovascular, respiratory, GI, GU, musculoskeletal, neuro, psychiatric, endocrine,  integumentary and hematologic systems were reviewed and are otherwise  negative/unremarkable except for positive findings mentioned above in the HPI.   MEDICATIONS AT HOME:   Prior to Admission medications   Medication Sig Start Date End Date Taking? Authorizing Provider  acetaminophen (TYLENOL) 325 MG tablet Take 650 mg by mouth every 4 (four) hours as needed.    [provider]  acetaminophen (TYLENOL) 500 MG tablet Take 2 tablets (1,000 mg total) by mouth every 8 (eight) hours as needed for headache. 03/27/15   Eula Listen, MD  ALPRAZolam Duanne Moron) 1 MG tablet Take half tablet twice daily as needed./ pm 01/30/15   [provider]  baclofen (LIORESAL) 10 MG tablet as needed.  09/28/15   [provider]  benzonatate (TESSALON) 200 MG capsule Take one cap TID PRN cough 07/01/17   Lorin Picket, PA-C  clonazePAM (KLONOPIN) 1 MG tablet Take 1 mg by mouth daily.     [provider]  diltiazem (CARDIZEM CD) 120 MG 24 hr capsule TAKE ONE CAPSULE DAILY/ am 10/30/15   [provider]  diphenoxylate-atropine (LOMOTIL) 2.5-0.025 MG tablet Take by mouth 4 (four) times daily as needed.     [provider]  doxycycline (VIBRAMYCIN) 100 MG capsule Take 1 capsule (100 mg total) by mouth 2 (two) times daily. Patient not taking: Reported on 08/14/2017 07/01/17   Crecencio Mc P, PA-C  gabapentin (NEURONTIN) 100 MG capsule Take 2 capsules by mouth 2 (two)  times daily. Am and pm 05/12/14   [provider]  levothyroxine (SYNTHROID, LEVOTHROID) 25 MCG tablet TAKE ONE TABLET DAILY ON EMPTY STOMACH WITH GLASS OF WATER AT LEAST 30-60 MINUTES BEFORE BREAKFAST 01/25/15   [provider]  lidocaine (ASPERCREME W/LIDOCAINE) 4 % cream Apply 1 application topically as needed.    [provider]  loperamide (IMODIUM) 2 MG capsule Take by mouth daily.    [provider]  Multiple Vitamin (MULTI-VITAMIN DAILY PO) Take by mouth daily.    [provider]  ondansetron (ZOFRAN) 4 MG tablet Take 1 tablet by mouth. Every 4 to 6 hours as needed for nausea, vomiting 01/04/14   [provider]  rivaroxaban (XARELTO) 20 MG TABS tablet Take 1 tablet (20 mg total) by mouth daily. Patient taking differently: Take 20 mg by mouth daily. 630 pm 07/06/14   Loletha Grayer, MD  sertraline (ZOLOFT) 100 MG tablet Take 1 tablet by mouth daily. 04/13/14   [provider]  tiZANidine (ZANAFLEX) 2 MG tablet every 6 (six) hours as needed.  12/28/14   [provider]  traMADol (ULTRAM) 50 MG tablet Take by mouth every 6 (six) hours as needed.    [provider]  triamcinolone (KENALOG) 0.025 % cream Apply 1 application topically 2 (two) times daily. Patient taking differently: Apply 1 application topically as needed.  07/01/17   Lorin Picket, PA-C  vitamin B-12 (CYANOCOBALAMIN) 1000 MCG tablet Take 2,000 mcg by mouth daily.     [provider]      VITAL SIGNS:  Blood pressure 125/76, pulse 84, temperature 98.2 F (36.8 C), temperature source Oral, resp. rate 20, height 5\' 2"  (1.575 m), weight 67.6 kg, SpO2 99 %.  PHYSICAL EXAMINATION:  Physical Exam  GENERAL:  86 y.o.-year-old pleasant Caucasian female patient lying in the bed with no acute distress.  EYES: Pupils equal, round, reactive to light and accommodation. No scleral icterus. Extraocular muscles intact.  HEENT: Head atraumatic, normocephalic. Oropharynx and nasopharynx clear.  NECK:  Supple, no jugular venous distention. No thyroid enlargement, no tenderness.  LUNGS: Normal breath sounds bilaterally, no wheezing, rales,rhonchi or crepitation. No use of accessory muscles of respiration.  CARDIOVASCULAR: Regular rate and rhythm, S1, S2 normal. No murmurs, rubs, or gallops.  ABDOMEN: Soft, nondistended, nontender. Bowel sounds present. No organomegaly or mass.  Rectal exam by the ER physician revealed heme-negative stools. EXTREMITIES: No pedal edema, cyanosis, or clubbing.  NEUROLOGIC: Cranial nerves II through XII are intact. Muscle strength 5/5 in all extremities. Sensation intact. Gait not checked.  PSYCHIATRIC: The patient is alert and oriented x 3.  Normal affect and good eye contact. SKIN: No obvious rash, lesion, or ulcer.   LABORATORY PANEL:   CBC Recent Labs  Lab 04/28/19 1745  WBC 8.7  HGB 6.7*  HCT 23.4*  PLT 389   ------------------------------------------------------------------------------------------------------------------  Chemistries  Recent Labs  Lab 04/28/19 1745  NA 137  K 3.4*  CL 103  CO2 23  GLUCOSE 108*  BUN 16   CREATININE 1.00  CALCIUM 9.2   ------------------------------------------------------------------------------------------------------------------  Cardiac Enzymes No results for input(s): TROPONINI in the last 168 hours. ------------------------------------------------------------------------------------------------------------------  RADIOLOGY:  DG Chest Portable 1 View  Result Date: 04/28/2019 CLINICAL DATA:  Short of breath EXAM: PORTABLE CHEST 1 VIEW COMPARISON:  07/02/2014 FINDINGS: Single frontal view of the chest demonstrates stable enlarged cardiac silhouette. Aortic atherosclerosis unchanged. Chronic scarring throughout the lungs without focal consolidation, effusion, or pneumothorax. Vasculature is normal. IMPRESSION: 1. Stable  exam, no acute process. Electronically Signed   By: Randa Ngo M.D.   On: 04/28/2019 19:34      IMPRESSION AND PLAN:   1.  Symptomatic microcytic anemia, likely iron deficiency, acute on chronic. -The patient will be admitted to a medical monitored bed. -She was typed and crossmatch and will be transfused 2 units of packed red blood cells. -Will follow posttransfusion H&H. -We will place her for now on IV PPI therapy and temporary hold her Xarelto. -Will obtain a hematology consultation.  I notified Dr. Mike Gip about the patient.  2.  Hypokalemia. -This could be secondary to chronic diarrhea.  Stool C. difficile was ordered by the ER physician. -Potassium will be replaced and magnesium level will be checked.  3.  Possible UTI. -The patient will be placed on p.o. Bactrim and will follow urine culture and sensitivity.  4.  History of  PE. -Patient will will be placed on SCDs for now and will hold Xarelto pending improvement of her H&H.  5.  Hypothyroidism. -We will continue Synthroid and check TSH.  6.  Paroxysmal atrial fibrillation. -The patient is currently in normal sinus rhythm.  7.  Depression with anxiety. -We will continue  Zoloft and Xanax.  8.  DVT prophylaxis. -SCDs for now.  Will resume Xarelto with improvement of her H&H.  9.  GI prophylaxis. -PPI therapy for now.   All the records are reviewed and case discussed with ED provider. The plan of care was discussed in details with the patient (and family). I answered all questions. The patient agreed to proceed with the above mentioned plan. Further management will depend upon hospital course.   CODE STATUS: Full code  TOTAL TIME TAKING CARE OF THIS PATIENT: 55 minutes.    Christel Mormon M.D on 04/28/2019 at 8:09 PM  [facility]   From 7 PM-7 AM, contact night-coverage [email or web address]  CC: Primary care physician; Glendon Axe, MD   Note: This dictation was prepared with Dragon dictation along with smaller phrase technology. Any transcriptional errors that result from this process are unintentional.

## 2019-04-28 NOTE — ED Notes (Signed)
Admitting MD at bedside, pt given crackers and gingerale

## 2019-04-28 NOTE — ED Triage Notes (Signed)
Pt to her md this am for IBS and weakness. Pt was placed on meds but pt has not picked up yet. Pt was called by her md and told to come to the ED for HGB of 6. Pt is pale.

## 2019-04-29 DIAGNOSIS — D509 Iron deficiency anemia, unspecified: Principal | ICD-10-CM

## 2019-04-29 DIAGNOSIS — R5383 Other fatigue: Secondary | ICD-10-CM

## 2019-04-29 DIAGNOSIS — Z7901 Long term (current) use of anticoagulants: Secondary | ICD-10-CM

## 2019-04-29 DIAGNOSIS — D649 Anemia, unspecified: Secondary | ICD-10-CM

## 2019-04-29 DIAGNOSIS — Z9049 Acquired absence of other specified parts of digestive tract: Secondary | ICD-10-CM

## 2019-04-29 DIAGNOSIS — E538 Deficiency of other specified B group vitamins: Secondary | ICD-10-CM

## 2019-04-29 DIAGNOSIS — R197 Diarrhea, unspecified: Secondary | ICD-10-CM

## 2019-04-29 DIAGNOSIS — Z85060 Personal history of malignant carcinoid tumor of small intestine: Secondary | ICD-10-CM

## 2019-04-29 DIAGNOSIS — I2699 Other pulmonary embolism without acute cor pulmonale: Secondary | ICD-10-CM

## 2019-04-29 LAB — PROTIME-INR
INR: 1 (ref 0.8–1.2)
Prothrombin Time: 13.5 seconds (ref 11.4–15.2)

## 2019-04-29 LAB — CBC
HCT: 28.5 % — ABNORMAL LOW (ref 36.0–46.0)
Hemoglobin: 9 g/dL — ABNORMAL LOW (ref 12.0–15.0)
MCH: 24.7 pg — ABNORMAL LOW (ref 26.0–34.0)
MCHC: 31.6 g/dL (ref 30.0–36.0)
MCV: 78.3 fL — ABNORMAL LOW (ref 80.0–100.0)
Platelets: 299 10*3/uL (ref 150–400)
RBC: 3.64 MIL/uL — ABNORMAL LOW (ref 3.87–5.11)
RDW: 15.6 % — ABNORMAL HIGH (ref 11.5–15.5)
WBC: 7.2 10*3/uL (ref 4.0–10.5)
nRBC: 0 % (ref 0.0–0.2)

## 2019-04-29 LAB — HEMOGLOBIN AND HEMATOCRIT, BLOOD
HCT: 23.8 % — ABNORMAL LOW (ref 36.0–46.0)
HCT: 29.2 % — ABNORMAL LOW (ref 36.0–46.0)
Hemoglobin: 7.4 g/dL — ABNORMAL LOW (ref 12.0–15.0)
Hemoglobin: 9.4 g/dL — ABNORMAL LOW (ref 12.0–15.0)

## 2019-04-29 LAB — BASIC METABOLIC PANEL
Anion gap: 9 (ref 5–15)
BUN: 13 mg/dL (ref 8–23)
CO2: 23 mmol/L (ref 22–32)
Calcium: 8.7 mg/dL — ABNORMAL LOW (ref 8.9–10.3)
Chloride: 107 mmol/L (ref 98–111)
Creatinine, Ser: 0.95 mg/dL (ref 0.44–1.00)
GFR calc Af Amer: >60 mL/min (ref >60)
GFR calc non Af Amer: 54 mL/min — ABNORMAL LOW (ref >60)
Glucose, Bld: 86 mg/dL (ref 70–99)
Potassium: 4.3 mmol/L (ref 3.5–5.1)
Sodium: 139 mmol/L (ref 135–145)

## 2019-04-29 LAB — C DIFFICILE QUICK SCREEN W PCR REFLEX
C Diff antigen: NEGATIVE
C Diff interpretation: NOT DETECTED
C Diff toxin: NEGATIVE

## 2019-04-29 LAB — SARS CORONAVIRUS 2 (TAT 6-24 HRS): SARS Coronavirus 2: NEGATIVE

## 2019-04-29 LAB — MAGNESIUM: Magnesium: 2 mg/dL (ref 1.7–2.4)

## 2019-04-29 LAB — TSH: TSH: 4.127 u[IU]/mL (ref 0.350–4.500)

## 2019-04-29 LAB — APTT: aPTT: 31 seconds (ref 24–36)

## 2019-04-29 LAB — PREPARE RBC (CROSSMATCH)

## 2019-04-29 MED ORDER — VITAMIN B-12 1000 MCG PO TABS
1000.0000 ug | ORAL_TABLET | Freq: Every day | ORAL | Status: DC
  Administered 2019-04-30: 1000 ug via ORAL
  Filled 2019-04-29: qty 1

## 2019-04-29 MED ORDER — SODIUM CHLORIDE 0.9 % IV SOLN
510.0000 mg | Freq: Once | INTRAVENOUS | Status: AC
  Administered 2019-04-29: 510 mg via INTRAVENOUS
  Filled 2019-04-29: qty 17

## 2019-04-29 MED ORDER — RIVAROXABAN 20 MG PO TABS
20.0000 mg | ORAL_TABLET | Freq: Every evening | ORAL | Status: DC
  Administered 2019-04-29: 22:00:00 20 mg via ORAL
  Filled 2019-04-29 (×3): qty 1

## 2019-04-29 NOTE — Consult Note (Signed)
[facility]  Date of admission:  04/28/2019  Inpatient day:  04/29/2019  Consulting physician: Dr Eugenie Norrie   Reason for Consultation:  Symptomatic anemia  Chief Complaint: Lauren Mccann is a 86 y.o. female with a history of a neuroendocrine tumor s/p right hemicolectomy, chronic diarrhea, and pulmonary emboli on Xarelto, who was admitted with symptomatic anemia.  HPI:  The patient was previously followed in the hematology/oncology clinic.  She was last seen on 08/02/2106.  She has a history of a neuroendocrine tumor s/p right hemicolectomy on 10/29/2012.  Pathology revealed a 2.0 cm grade I neuroendocrine tumor arising in the distal ileum.  Tumor extended through the muscularis propria into the adjacent adipose tissue.  Margins were negative.  There was lymph-vascular invasion.  Five of 14 lymph nodes were positive.  Pathologic stage was T3N1.  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  She received Sandostatin LAR 30 mg every 4 weeks for about 2 years.  She was diagnosed with extensive bilateral pulmonary emboli on 11/06/2011.  She had a saddle embolus.  There was a large pulmonary emboli within the right main pulmonary artery extending into the segmental branches of the right upper, right lower and right middle lobe.  There were pulmonary emboli in the left upper lobe and peripheral branches of the left lower lobe artery segements.  She was treated with Lovenox and discharged on Xarelto.  Lower extremity duplex revealed occlusive thrombus in the right popliteal vein and near occlusive thrombus in the proximal superficial femoral vein.  An IVC filter was placed.  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  She has been on Xarelto.  She has a history of bilateral breast cancer.  She right breast cancer 25 years ago. She underwent lumpectomy.   Left breast cancer was diagnosed 10+ years ago. She never received radiation or chemotherapy. No details are available.  She presented with on 04/28/2019 with progressive fatigue since Christmas.  She describes being short of breath and not having enough energy to stand up and make a sandwich.  She describes eating "what I can get my hands on".  She denies eating much iron rich foods.    She was seen in the [facility] on 04/28/2019 by Effie Berkshire.  She noted diarrhea daily.  She typically has 2 bowel movements before breakfast and multiple additional bowel movements a day (total 5-6 or more).  Stools are watery and recently looked like chili.  She denies any melena, hematochezia, hematuria or vaginal bleeding.  She has intermittent abdominal discomfort.  Weight has increased by 9-10 pounds over the past year.  Plan was for abdomen and pelvis CT.  Labs on 04/28/2019 revealed a hematocrit of 22.8, hemoglobin 6.7, MCV 77.6, platelets 384,000, WBC 8700.  Creatinine 1.0.  Albumin 4.0.  Liver function tests normal.    Prior CBCs: 03/27/2015: Hematocrit 39.3.  Hemoglobin 13.1.  MCV 90.7. 07/23/2016: Hematocrit 37.7.  Hemoglobin 12.9.  MCV 90.9. 09/13/2018: Hematocrit 33.6.  Hemoglobin 10.8.  MCV 85.9. 04/28/2019: Hematocrit 23.4.  Hemoglobin 6.7.  MCV 77.2.   Ferritin was 4 with an iron saturation of 2% and a TIBC 462 c/w severe iron deficiency anemia.  Retic was 1.1%.  B12 was 269 (low).  Additional normal labs included a folate (32), TSH (4.127), LDH (126), PT/INR (13.5/1.0) and PTT (31).  Urinalysis revealed no hematuria.  Stool is negative for C diff.    She received 1 unit of PRBCs on 04/28/2019 and 1 unit on 04/29/2019.  Follow-up hematocrit was 28.5 with a hemoglobin of 9.0 this morning.   Past Medical History:  Diagnosis Date  . A-fib ([place])   . Anemia   . Anxiety   . Arthritis    neck, back, knees  . Breast cancer ([place]) 03/11/2014   Overview:  S/p right mastectomy.   Reoccurrence s/p left matstectomy.  . Cancer ([place])    breast  . Closed fracture of neck of left femur with routine healing 08/07/2014  . Depression   . Dyspnea    easily  . Fibromyalgia   . GERD (gastroesophageal reflux disease)   . Headache    everyday  . Hip fracture requiring operative repair ([place]) 07/04/2014  . History of neck problems    shots in neck every three months  . Hypertension   . Hyperthyroidism   . IBS (irritable bowel syndrome)   . Neuropathy   . Osteoporosis   . Pulmonary embolism ([place])    H/O  . Stroke [facility])    in past no residual effects  . Thyroid disease   . Tremor of both hands   . Vertigo    worse in past    Past Surgical History:  Procedure Laterality Date  . ABDOMINAL HYSTERECTOMY    . CATARACT EXTRACTION    . CATARACT EXTRACTION W/PHACO Right 08/18/2017   Procedure: CATARACT EXTRACTION PHACO AND INTRAOCULAR LENS PLACEMENT ([place]) right;  Surgeon: Eulogio Bear, MD;  Location: [facility];  Service: Ophthalmology;  Laterality: Right;  . CHOLECYSTECTOMY    . COLON SURGERY     resection  . HIP PINNING,CANNULATED Left 07/03/2014   Procedure: CANNULATED HIP PINNING;  Surgeon: Claud Kelp, MD;  Location: [facility];  Service: Orthopedics;  Laterality: Left;  Marland Kitchen MASTECTOMY Bilateral   . VENA CAVA FILTER PLACEMENT      Family History  Problem Relation Age of Onset  . Cancer Father        Bone  . Kidney cancer Neg Hx   . Prostate cancer Neg Hx   . Bladder Cancer Neg Hx     Social History:  reports that she has never smoked. She has never used smokeless tobacco. She reports that she does not drink alcohol or use drugs.  She lives at the [place] apartment complex.  She lives alone.  Her friend Joycelyn Schmid lives upstairs.  She is alone today.  Allergies:  Allergies  Allergen Reactions  . Codeine Shortness Of Breath and Other (See Comments)    GI Upset  Pt states "makes heart flutter and trouble breathing" Other reaction(s): Other (See  Comments) Chest Tightness, SOB, Tachycardia GI Upset  Pt states "makes heart flutter and trouble breathing" Other reaction(s): Other (See Comments) Chest Tightness, SOB, Tachycardia GI Upset  Pt states "makes heart flutter and trouble breathing"  . Propoxyphene Shortness Of Breath    Other reaction(s): Other (See Comments) GI Upset  . Alendronate Sodium     Other reaction(s): Other (See Comments) GI Upset  . Atorvastatin     Other reaction(s): Other (See Comments) Myalgia  . Benadryl [Diphenhydramine Hcl] Other (See Comments)    Restless legs  . Cefuroxime Axetil Nausea Only  . Cymbalta [Duloxetine Hcl]   . Diphenhydramine   . Lipitor [Atorvastatin Calcium]   . Prednisone     Other reaction(s): Other (See Comments) Chest Pain, SOB, "can't breathe good"  Unable to sleep  . Levofloxacin Rash and Other (See Comments)    Altered mental status    Medications Prior to Admission  Medication Sig Dispense Refill  . acetaminophen (TYLENOL) 325 MG tablet Take 650 mg by mouth every 4 (four) hours as needed for mild pain or fever.     . ALPRAZolam (XANAX) 1 MG tablet Take 0.5 mg by mouth 2 (two) times daily as needed for anxiety.     Marland Kitchen levothyroxine (SYNTHROID, LEVOTHROID) 25 MCG tablet Take 25 mcg by mouth daily.     Marland Kitchen loperamide (IMODIUM) 2 MG capsule Take 2-4 mg by mouth 2 (two) times daily as needed for diarrhea or loose stools.    . metoprolol succinate (TOPROL-XL) 50 MG 24 hr tablet Take 50 mg by mouth daily.    . rivaroxaban (XARELTO) 20 MG TABS tablet Take 1 tablet (20 mg total) by mouth daily. (Patient taking differently: Take 20 mg by mouth every evening. ) 30 tablet 1  . sertraline (ZOLOFT) 100 MG tablet Take 150 mg by mouth daily.       Review of Systems: GENERAL:  Fatigue.  No fevers, sweats.  Weight gain of 10 pounds over the past year. PERFORMANCE STATUS (ECOG):  1-2 HEENT:  No visual changes, runny nose, sore throat, mouth sores or tenderness. Lungs:  Shortness of  breath with exertion.  No cough.  No hemoptysis. Cardiac:  No chest pain, palpitations, orthopnea, or PND. GI:  Chronic watery diarrhea.  Occasional abdominal cramping.  No nausea, vomiting, constipation, melena or hematochezia. GU:  No urgency, frequency, dysuria, or hematuria. Musculoskeletal:  Fibromyalgia.  No back pain.  No joint pain.   Extremities:  No pain or swelling. Skin:  No rashes or skin changes. Neuro:  Restless legs.  Headache.  General weakness.  Dizzy/lightheaded prior to admission.  No focal numbness or weakness, balance or coordination issues. Endocrine:  No diabetes.  Hypothyroid on Synthroid.  No hot flashes or night sweats. Psych:  No mood changes, depression or anxiety. Pain:  No focal pain. Review of systems:  All other systems reviewed and found to be negative.  Physical Exam:  Blood pressure (!) 144/89, pulse 79, temperature 98.1 F (36.7 C), temperature source Oral, resp. rate 18, height 5\' 2"  (1.575 m), weight 149 lb (67.6 kg), SpO2 97 %.  GENERAL:  Well developed, well nourished, woman sitting comfortably on the medical unit in no acute distress. MENTAL STATUS:  Alert and oriented to person, place and time. HEAD:  Short blonde hair.  Normocephalic, atraumatic, face symmetric, no Cushingoid features. EYES:  Blue eyes.  Pupils equal round and reactive to light and accomodation.  No conjunctivitis or scleral icterus. ENT:  Oropharynx clear without lesion.  Tongue normal. Mucous membranes moist.  RESPIRATORY:  Clear to auscultation without rales, wheezes or rhonchi. CARDIOVASCULAR:  Regular rate and rhythm without murmur, rub or gallop. ABDOMEN:  Soft, slightly tender lower quadrants without guarding or rebound tenderness.  Active bowel sounds.  Nno appreciable hepatosplenomegaly.  No masses. SKIN:  No rashes, ulcers or lesions. EXTREMITIES: No edema, no skin discoloration or tenderness.  No palpable cords. LYMPH NODES: No palpable cervical, supraclavicular,  axillary or inguinal adenopathy  NEUROLOGICAL: Unremarkable. PSYCH:  Appropriate.   Results for orders placed or performed during the hospital encounter of 04/28/19 (from the past 48 hour(s))  Basic metabolic panel     Status: Abnormal   Collection Time: 04/28/19  5:45 PM  Result Value Ref Range   Sodium 137  135 - 145 mmol/L   Potassium 3.4 (L) 3.5 - 5.1 mmol/L   Chloride 103 98 - 111 mmol/L   CO2 23 22 - 32 mmol/L   Glucose, Bld 108 (H) 70 - 99 mg/dL    Comment: Glucose reference range applies only to samples taken after fasting for at least 8 hours.   BUN 16 8 - 23 mg/dL   Creatinine, Ser 1.00 0.44 - 1.00 mg/dL   Calcium 9.2 8.9 - 10.3 mg/dL   GFR calc non Af Amer 51 (L) >60 mL/min   GFR calc Af Amer 59 (L) >60 mL/min   Anion gap 11 5 - 15    Comment: Performed at [facility], [address] [place], [place] [address]  CBC     Status: Abnormal   Collection Time: 04/28/19  5:45 PM  Result Value Ref Range   WBC 8.7 4.0 - 10.5 K/uL   RBC 3.03 (L) 3.87 - 5.11 MIL/uL   Hemoglobin 6.7 (L) 12.0 - 15.0 g/dL    Comment: Reticulocyte Hemoglobin testing may be clinically indicated, consider ordering this additional test [id number]    HCT 23.4 (L) 36.0 - 46.0 %   MCV 77.2 (L) 80.0 - 100.0 fL   MCH 22.1 (L) 26.0 - 34.0 pg   MCHC 28.6 (L) 30.0 - 36.0 g/dL   RDW 15.6 (H) 11.5 - 15.5 %   Platelets 389 150 - 400 K/uL   nRBC 0.0 0.0 - 0.2 %    Comment: Performed at [facility], [address] [place], [place] [address]  Urinalysis, Complete w Microscopic     Status: Abnormal   Collection Time: 04/28/19  5:45 PM  Result Value Ref Range   Color, Urine YELLOW (A) YELLOW   APPearance HAZY (A) CLEAR   Specific Gravity, Urine 1.021 1.005 - 1.030   pH 5.0 5.0 - 8.0   Glucose, UA NEGATIVE NEGATIVE mg/dL   Hgb urine dipstick NEGATIVE NEGATIVE   Bilirubin Urine NEGATIVE NEGATIVE   Ketones, ur NEGATIVE NEGATIVE mg/dL   Protein, ur NEGATIVE NEGATIVE mg/dL   Nitrite  POSITIVE (A) NEGATIVE   Leukocytes,Ua NEGATIVE NEGATIVE   RBC / HPF 0-5 0 - 5 RBC/hpf   WBC, UA 0-5 0 - 5 WBC/hpf   Bacteria, UA MANY (A) NONE SEEN   Squamous Epithelial / LPF 0-5 0 - 5   Mucus PRESENT    Hyaline Casts, UA PRESENT    Ca Oxalate Crys, UA PRESENT     Comment: Performed at [facility], [address] [place], [place] [address]  Type and screen [place]     Status: None (Preliminary result)   Collection Time: 04/28/19  5:45 PM  Result Value Ref Range   ABO/RH(D) A POS    Antibody Screen NEG    Sample Expiration 05/01/2019,2359    Unit Number [id number]    Blood Component Type RBC, LR IRR    Unit division 00    Status of Unit ISSUED    Transfusion Status OK TO TRANSFUSE    Crossmatch Result Compatible    Unit Number [id number]    Blood Component Type RED CELLS,LR    Unit division 00    Status of Unit ISSUED    Transfusion Status OK TO TRANSFUSE    Crossmatch Result      Compatible Performed at [facility], [address], [place], [place] [address]   Prepare RBC     Status: None  Collection Time: 04/28/19  7:31 PM  Result Value Ref Range   Order Confirmation      ORDER PROCESSED BY BLOOD BANK Performed at [facility], [address] [place], [place] [address]   Vitamin B12     Status: None   Collection Time: 04/28/19  7:47 PM  Result Value Ref Range   Vitamin B-12 269 180 - 914 pg/mL    Comment: (NOTE) This assay is not validated for testing neonatal or myeloproliferative syndrome specimens for Vitamin B12 levels. Performed at [facility], [place] [address] [place], [place] [address]   Folate     Status: None   Collection Time: 04/28/19  7:47 PM  Result Value Ref Range   Folate 32.0 >5.9 ng/mL    Comment: Performed at [facility], [address] [place], [place] [address]  Iron and TIBC     Status: Abnormal   Collection Time: 04/28/19  7:47 PM  Result Value Ref Range    Iron 9 (L) 28 - 170 ug/dL   TIBC 462 (H) 250 - 450 ug/dL   Saturation Ratios 2 (L) 10.4 - 31.8 %   UIBC 453 ug/dL    Comment: Performed at [facility], [address], [place], [place] [address]  Ferritin     Status: Abnormal   Collection Time: 04/28/19  7:47 PM  Result Value Ref Range   Ferritin 4 (L) 11 - 307 ng/mL    Comment: Performed at [facility], [address] [place], [place] [address]  Reticulocytes     Status: Abnormal   Collection Time: 04/28/19  7:47 PM  Result Value Ref Range   Retic Ct Pct 1.1 0.4 - 3.1 %   RBC. 2.89 (L) 3.87 - 5.11 MIL/uL   Retic Count, Absolute 32.9 19.0 - 186.0 K/uL   Immature Retic Fract 16.4 (H) 2.3 - 15.9 %    Comment: Performed at [facility], [address] [place], [place] [address]  ABO/Rh     Status: None   Collection Time: 04/28/19  7:47 PM  Result Value Ref Range   ABO/RH(D)      A POS Performed at [facility], [address], [place], [place] [address]   Lactate dehydrogenase     Status: None   Collection Time: 04/28/19  7:47 PM  Result Value Ref Range   LDH 126 98 - 192 U/L    Comment: Performed at [facility], [address] [place], [place] [address]  Protime-INR     Status: None   Collection Time: 04/28/19 11:12 PM  Result Value Ref Range   Prothrombin Time 13.5 11.4 - 15.2 seconds   INR 1.0 0.8 - 1.2    Comment: (NOTE) INR goal varies based on device and disease states. Performed at [facility], [address] [place], [place] [address]   APTT     Status: None   Collection Time: 04/28/19 11:12 PM  Result Value Ref Range   aPTT 31 24 - 36 seconds    Comment: Performed at [facility], [address] [place], [place] [address]  Hemoglobin and hematocrit, blood     Status: Abnormal   Collection Time: 04/28/19 11:12 PM  Result Value Ref Range   Hemoglobin 7.8 (L) 12.0 - 15.0 g/dL   HCT 25.3 (L) 36.0 - 46.0 %    Comment: Performed at  [facility], [address], [place], [place] [address]  TSH  Status: None   Collection Time: 04/28/19 11:12 PM  Result Value Ref Range   TSH 4.127 0.350 - 4.500 uIU/mL    Comment: Performed by a 3rd Generation assay with a functional sensitivity of <=0.01 uIU/mL. Performed at [facility], [address] [place], [place] [address]   Magnesium     Status: None   Collection Time: 04/28/19 11:12 PM  Result Value Ref Range   Magnesium 2.0 1.7 - 2.4 mg/dL    Comment: Performed at [facility], [address] [place], [place] [address]  Prepare RBC     Status: None   Collection Time: 04/29/19  1:13 AM  Result Value Ref Range   Order Confirmation      ORDER PROCESSED BY BLOOD BANK Performed at [facility], [address] [place], [place] [address]   Hemoglobin and hematocrit, blood     Status: Abnormal   Collection Time: 04/29/19  1:44 AM  Result Value Ref Range   Hemoglobin 7.4 (L) 12.0 - 15.0 g/dL   HCT 23.8 (L) 36.0 - 46.0 %    Comment: Performed at [facility], [address], [place], [place] [id number]  Basic metabolic panel     Status: Abnormal   Collection Time: 04/29/19  4:42 AM  Result Value Ref Range   Sodium 139 135 - 145 mmol/L   Potassium 4.3 3.5 - 5.1 mmol/L   Chloride 107 98 - 111 mmol/L   CO2 23 22 - 32 mmol/L   Glucose, Bld 86 70 - 99 mg/dL    Comment: Glucose reference range applies only to samples taken after fasting for at least 8 hours.   BUN 13 8 - 23 mg/dL   Creatinine, Ser 0.95 0.44 - 1.00 mg/dL   Calcium 8.7 (L) 8.9 - 10.3 mg/dL   GFR calc non Af Amer 54 (L) >60 mL/min   GFR calc Af Amer >60 >60 mL/min   Anion gap 9 5 - 15    Comment: Performed at [facility], [address] [place], [place] [address]  CBC     Status: Abnormal   Collection Time: 04/29/19  4:42 AM  Result Value Ref Range   WBC 7.2 4.0 - 10.5 K/uL   RBC 3.64 (L) 3.87 - 5.11 MIL/uL   Hemoglobin 9.0 (L) 12.0 - 15.0  g/dL   HCT 28.5 (L) 36.0 - 46.0 %   MCV 78.3 (L) 80.0 - 100.0 fL   MCH 24.7 (L) 26.0 - 34.0 pg   MCHC 31.6 30.0 - 36.0 g/dL   RDW 15.6 (H) 11.5 - 15.5 %   Platelets 299 150 - 400 K/uL   nRBC 0.0 0.0 - 0.2 %    Comment: Performed at [facility], [address] [place],  [address]   DG Chest Portable 1 View  Result Date: 04/28/2019 CLINICAL DATA:  Short of breath EXAM: PORTABLE CHEST 1 VIEW COMPARISON:  07/02/2014 FINDINGS: Single frontal view of the chest demonstrates stable enlarged cardiac silhouette. Aortic atherosclerosis unchanged. Chronic scarring throughout the lungs without focal consolidation, effusion, or pneumothorax. Vasculature is normal. IMPRESSION: 1. Stable exam, no acute process. Electronically Signed   By: Randa Ngo M.D.   On: 04/28/2019 19:34    Assessment:  The patient is a 86 y.o. woman with a history of a neuroendocrine tumor s/p right hemicolectomy, chronic diarrhea, and pulmonary emboli on Xarelto, who was admitted with symptomatic anemia.  She has iron deficiency anemia.  Microcytic anemia has  developed since 09/2018.  Diet is poor.  She denies any melena, hematochezia, hematuria or vaginal bleeding.  Stool is guaiac negative.  Labs included a ferritin of 4 with an iron saturation of 2% and a TIBC 462 c/w severe iron deficiency anemia.  Retic was 1.1%.  B12 was 269 (low).  Normal labs included a folate, TSH, LDH, PT/INR and PTT.  Urinalysis revealed no hematuria.   Stool is negative for C diff.    Symptomatically, she has been fatigued for 3 months.  Exam reveals tender lower abdominal quadrants without guarding or rebound tenderness.  Plan:   1.   Iron deficiency anemia  Patient has severe iron deficiency anemia felt secondary to poor diet and possibly chronic diarrhea.   Ferritin 4 (low).  No current evidence of GI bleeding.    Will discuss further work-up with GI.  Patient's Xarelto restarted today.  Hemoglobin has improved from 6.7 to  9.4 after 2 units of PRBCs.  Continue to monitor serial HCT/Hgb.  She received Feraheme today. 2.   B12 deficiency  Etiology likely secondary to poor diet and possibly malabsorption.  Begin B12 1000 mcg a day. 3.   Diarrhea  Chronic since right hemicolectomy.  Stool negative for C diff.  Serum chromogranin and 24 hour urine for 5HIAA to r/o carcinoid ordered. 4.  Disposition  Anticipate discharge in next 1-2 days based on stability of counts.  Anticipate follow-up in the hematology clinic post discharge.  Thank you for allowing me to participate in [facility] 's care.  I will follow her closely with you while hospitalized and after discharge in the outpatient department.   Lequita Asal, MD  04/29/2019, 6:13 AM

## 2019-04-29 NOTE — Progress Notes (Signed)
PROGRESS NOTE    Lauren Mccann  [id number] DOB: 1930-10-29 DOA: 04/28/2019 PCP: Glendon Axe, MD   Brief Narrative:  Lauren Mccann  is a 86 y.o. pleasant Caucasian female with a known history of multiple medical problems including PE and atrial fibrillation on Xarelto,neuroendocrine tumors/p right hemicolectomy on 10/29/2012 and chronic diarrhea secondary to carcinoid syndrome presented to the emergency room with acute onset of generalized weakness with associated dyspnea on exertion and occasional dizziness which have been going on since before Christmas and have worsened the last few days.  She was found to have iron deficient anemia with hemoglobin of 6.7.  No obvious sign of bleeding.  FOBT negative.  Given 2 units PRBC.  Subjective: Patient was feeling little better when seen this morning.  Did not had any bowel movement since this morning.  She occasionally gets some flank tenderness with deep palpation, no abdominal pain.  Denies any urinary symptoms.  According to patient she is having diarrhea for many years, denies any hematochezia or melena.  No nausea or vomiting.  Her diarrhea responded well to Imodium which she uses it 2-3 times a day.  Patient lives alone and unable to prepare meals for many months.  She mostly eat crackers and cookies.  Assessment & Plan:   Active Problems:   Symptomatic anemia  Iron deficiency anemia.  Most likely secondary to poor dietary habits.  No obvious sign of bleeding.  FOBT negative.  UA without any hematuria. Hemoglobin and improved to 9 with 2 units PRBC. -Hematology was consulted from ED-appreciate their recommendations. -We will give her 1 dose of Feraheme. -She should be started on iron supplement. -Continue to monitor hemoglobin  Generalized weakness.  Multifactorial, due to symptomatic anemia, poor p.o. intake and history of malignancy.  Patient lives alone in a retirement community and unable to prepare meals for herself -PT/OT  evaluation.  Chronic diarrhea.  Most likely secondary to carcinoid syndrome.  She has an history of neuroendocrine tumor s/p right hemicolectomy on 10/29/2012.  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  There was some evidence of lymph vascular invasion.  Tumor were staged at T3N1 at that time.  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 -Heme oncology is on board-appreciate their recommendations. -C. difficile and GI pathogen was done in ED-C. difficile negative. -Discontinue enteric precautions as this is not an infectious diarrhea.  Electrolyte abnormalities.  Patient had mild hypokalemia most likely secondary to GI losses which was resolved with repletion today. -Continue to monitor-replete as needed.  Possible UTI.  UA was concerning for UTI. -Awaiting urine culture results. -Continue Bactrim for now.  History of PE.  Patient has an extensive history of PE and DVT in 2013.  She also has an IVC filter in place.  On Xarelto since then.  Xarelto was initially held on admission due to concern for symptomatic anemia.  No obvious sign of bleeding. -Continue Xarelto.  Hypothyroidism.  TSH within normal range. -Continue home dose of Synthroid.  Paroxysmal atrial fibrillation.  Currently in sinus rhythm. -Continue Xarelto.  Depression with anxiety. - Continue Zoloft and Xanax.  Objective: Vitals:   04/28/19 2338 04/29/19 0149 04/29/19 0213 04/29/19 0559  BP: 134/70 (!) 125/57 123/68 (!) 144/89  Pulse: 75 73 74 79  Resp: 14 14 16 18   Temp: 98.2 F (36.8 C) 97.9 F (36.6 C) 97.9 F (36.6 C)  98.1 F (36.7 C)  TempSrc: Oral Oral Oral Oral  SpO2: 98% 96% 96% 97%  Weight:      Height:        Intake/Output Summary (Last 24 hours) at 04/29/2019 1103 Last data filed at 04/29/2019 1030 Gross per 24 hour  Intake 735 ml  Output 500 ml  Net 235 ml    Filed Weights   04/28/19 1738  Weight: 67.6 kg    Examination:  General exam: Appears calm and comfortable  Respiratory system: Clear to auscultation. Respiratory effort normal. Cardiovascular system: S1 & S2 heard, RRR. No JVD, murmurs, rubs, gallops or clicks. Gastrointestinal system: Soft, nontender, nondistended, bowel sounds positive. Central nervous system: Alert and oriented. No focal neurological deficits.Symmetric 5 x 5 power. Extremities: No edema, no cyanosis, pulses intact and symmetrical. Skin: No rashes, lesions or ulcers Psychiatry: Judgement and insight appear normal. Mood & affect appropriate.    DVT prophylaxis: Xarelto Code Status: Full Family Communication: No family at bedside Disposition Plan: Most likely can go back home tomorrow, pending PT/OT evaluation for home health needs.  Consultants:   Hematology  Procedures:   Antimicrobials:  Bactrim  Data Reviewed: I have personally reviewed following labs and imaging studies  CBC: Recent Labs  Lab 04/28/19 1745 04/28/19 2312 04/29/19 0144 04/29/19 0442 04/29/19 1033  WBC 8.7  --   --  7.2  --   HGB 6.7* 7.8* 7.4* 9.0* 9.4*  HCT 23.4* 25.3* 23.8* 28.5* 29.2*  MCV 77.2*  --   --  78.3*  --   PLT 389  --   --  299  --    Basic Metabolic Panel: Recent Labs  Lab 04/28/19 1745 04/28/19 2312 04/29/19 0442  NA 137  --  139  K 3.4*  --  4.3  CL 103  --  107  CO2 23  --  23  GLUCOSE 108*  --  86  BUN 16  --  13  CREATININE 1.00  --  0.95  CALCIUM 9.2  --  8.7*  MG  --  2.0  --    GFR: Estimated Creatinine Clearance: 38.3 mL/min (by C-G formula based on SCr of 0.95 mg/dL). Liver Function Tests: No results for input(s): AST, ALT, ALKPHOS, BILITOT, PROT, ALBUMIN in the last 168 hours. No results for input(s): LIPASE, AMYLASE in the last 168 hours. No results for input(s): AMMONIA in the last 168 hours. Coagulation Profile: Recent Labs  Lab 04/28/19 2312  INR 1.0   Cardiac  Enzymes: No results for input(s): CKTOTAL, CKMB, CKMBINDEX, TROPONINI in the last 168 hours. BNP (last 3 results) No results for input(s): PROBNP in the last 8760 hours. HbA1C: No results for input(s): HGBA1C in the last 72 hours. CBG: No results for input(s): GLUCAP in the last 168 hours. Lipid Profile: No results for input(s): CHOL, HDL, LDLCALC, TRIG, CHOLHDL, LDLDIRECT in the last 72 hours. Thyroid Function Tests: Recent Labs    04/28/19 2312  TSH 4.127   Anemia Panel: Recent Labs    04/28/19 1947  VITAMINB12 269  FOLATE 32.0  FERRITIN 4*  TIBC 462*  IRON 9*  RETICCTPCT 1.1   Sepsis Labs: No results for input(s): PROCALCITON, LATICACIDVEN in the last 168 hours.  Recent Results (from the past 240 hour(s))  SARS CORONAVIRUS 2 (TAT 6-24 HRS) Nasopharyngeal Nasopharyngeal Swab     Status: None   Collection Time: 04/28/19  7:27 PM   Specimen: Nasopharyngeal Swab  Result Value Ref Range Status   SARS  Coronavirus 2 NEGATIVE NEGATIVE Final    Comment: (NOTE) SARS-CoV-2 target nucleic acids are NOT DETECTED. The SARS-CoV-2 RNA is generally detectable in upper and lower respiratory specimens during the acute phase of infection. Negative results do not preclude SARS-CoV-2 infection, do not rule out co-infections with other pathogens, and should not be used as the Mccann basis for treatment or other patient management decisions. Negative results must be combined with clinical observations, patient history, and epidemiological information. The expected result is Negative. Fact Sheet for Patients: SugarRoll.be Fact Sheet for Healthcare Providers: https://www.woods-mathews.com/ This test is not yet approved or cleared by the [place] FDA and  has been authorized for detection and/or diagnosis of SARS-CoV-2 by FDA under an Emergency Use Authorization (EUA). This EUA will remain  in effect (meaning this test can be used) for the  duration of the COVID-19 declaration under Section 56 4(b)(1) of the Act, 21 U.S.C. section 360bbb-3(b)(1), unless the authorization is terminated or revoked sooner. Performed at [facility], [place] [address], [place], [place] [address]      Radiology Studies: DG Chest Portable 1 View  Result Date: 04/28/2019 CLINICAL DATA:  Short of breath EXAM: PORTABLE CHEST 1 VIEW COMPARISON:  07/02/2014 FINDINGS: Single frontal view of the chest demonstrates stable enlarged cardiac silhouette. Aortic atherosclerosis unchanged. Chronic scarring throughout the lungs without focal consolidation, effusion, or pneumothorax. Vasculature is normal. IMPRESSION: 1. Stable exam, no acute process. Electronically Signed   By: Randa Ngo M.D.   On: 04/28/2019 19:34    Scheduled Meds: . sodium chloride   Intravenous Once  . ferrous sulfate  325 mg Oral TID  . levothyroxine  25 mcg Oral Daily  . metoprolol succinate  50 mg Oral Daily  . pantoprazole  40 mg Oral Daily  . sertraline  150 mg Oral Daily  . sodium chloride flush  3 mL Intravenous Once  . sulfamethoxazole-trimethoprim  1 tablet Oral Q12H   Continuous Infusions: . sodium chloride    . 0.9 % NaCl with KCl 20 mEq / L 100 mL/hr at 04/29/19 0608     LOS: 1 day   Time spent: 45 minutes.  I personally reviewed her chart.  Lorella Nimrod, MD [facility]  If 7PM-7AM, please contact night-coverage [email or web address]  04/29/2019, 11:03 AM   This record has been created using Systems analyst. Errors have been sought and corrected,but may not always be located. Such creation errors do not reflect on the standard of care.

## 2019-04-29 NOTE — Evaluation (Signed)
Physical Therapy Evaluation Patient Details Name: Lauren Mccann MRN: [id number] DOB: 1931-02-12 Today's Date: 04/29/2019   History of Present Illness  Pt is an 86 y.o. female presenting to hospital 04/28/19 with generalized weakness, anemia, SOB, and feeling "drunk" when she is up walking for past several days; (pt has had symptoms since before Christmas and worsened last few days).  No obvious signs of bleeding.  FOBT negative.  Pt admitted with symptomatic microcytic anemia (initial Hgb 6.7), hypokalemia, and possible UTI.  S/p blood transfusions.  PMH includes h/o chronic diarrhea s/p colectomy, h/o DVT PE, a-fib, breast CA s/p B mastectomy's, fibromyalgia, HA, h/o hip fx requiring operative repair, IBS, neuropathy, stroke, vertigo, IVC filter.  Clinical Impression  Prior to hospital admission, pt was ambulatory with cane and lives alone in apt in retirement community; recently having issues with performing ADL's on own d/t not feeling well.  Currently pt is modified independent with bed mobility; CGA with transfers; and CGA ambulating 50 feet with youth sized RW.  SOB noted with ambulation but pt and pt's granddaughter reporting it is improved compared to prior to hospitalization.  O2 sats (on room air) and HR WFL during sessions activities.  Pt would benefit from skilled PT to address noted impairments and functional limitations (see below for any additional details).  Upon hospital discharge, pt would benefit from HHPT and increased services/support at home (pt's granddaughter lives close and is supportive); care management notified.    Follow Up Recommendations Home health PT    Equipment Recommendations  Rolling walker with 5" wheels(youth sized)    Recommendations for Other Services OT consult     Precautions / Restrictions Precautions Precautions: Fall Restrictions Weight Bearing Restrictions: No      Mobility  Bed Mobility Overal bed mobility: Modified Independent             General bed mobility comments: Semi-supine to/from sit without any noted difficulties; HOB elevated  Transfers Overall transfer level: Needs assistance Equipment used: Rolling walker (2 wheeled)(youth sized) Transfers: Sit to/from Stand(fairly strong stand up to walker and controlled descent sitting)       General transfer comment: steady  Ambulation/Gait Ambulation/Gait assistance: Min guard Gait Distance (Feet): 50 Feet Assistive device: Rolling walker (2 wheeled)(youth sized)   Gait velocity: decreased mildly   General Gait Details: partial step through gait pattern; steady with walker use  Stairs            Wheelchair Mobility    Modified Rankin (Stroke Patients Only)       Balance Overall balance assessment: Needs assistance Sitting-balance support: No upper extremity supported;Feet supported Sitting balance-Leahy Scale: Normal Sitting balance - Comments: steady sitting reaching outside BOS   Standing balance support: No upper extremity supported Standing balance-Leahy Scale: Good Standing balance comment: steady standing reaching within BOS                             Pertinent Vitals/Pain Pain Assessment: Faces Pain Intervention(s): Limited activity within patient's tolerance;Monitored during session;Repositioned    Home Living Family/patient expects to be discharged to:: Other (Comment)(Apartment in retirement community; 2nd floor (uses Media planner)) Living Arrangements: Alone                    Prior Function Level of Independence: Independent with assistive device(s)         Comments: Pt modified independent with ADL's (prior to Christmas) but asks for assist from family/neighbors  for grocery shopping d/t the pandemic.  Uses cane for functional mobility.     Hand Dominance   Dominant Hand: Left    Extremity/Trunk Assessment   Upper Extremity Assessment Upper Extremity Assessment: Generalized weakness    Lower  Extremity Assessment Lower Extremity Assessment: Generalized weakness    Cervical / Trunk Assessment Cervical / Trunk Assessment: Normal  Communication   Communication: No difficulties  Cognition Arousal/Alertness: Awake/alert Behavior During Therapy: WFL for tasks assessed/performed Overall Cognitive Status: Within Functional Limits for tasks assessed                                        General Comments   Nursing cleared pt for participation in physical therapy.  Pt agreeable to PT session.  Pt's granddaughter present during session.    Exercises     Assessment/Plan    PT Assessment Patient needs continued PT services  PT Problem List Decreased strength;Decreased activity tolerance;Decreased balance;Decreased mobility;Decreased knowledge of use of DME       PT Treatment Interventions DME instruction;Gait training;Functional mobility training;Therapeutic activities;Therapeutic exercise;Balance training;Patient/family education    PT Goals (Current goals can be found in the Care Plan section)  Acute Rehab PT Goals Patient Stated Goal: to improve strength and mobility and breathing PT Goal Formulation: With patient Time For Goal Achievement: 05/13/19 Potential to Achieve Goals: Good    Frequency Min 2X/week   Barriers to discharge        Co-evaluation               AM-PAC PT "6 Clicks" Mobility  Outcome Measure Help needed turning from your back to your side while in a flat bed without using bedrails?: None Help needed moving from lying on your back to sitting on the side of a flat bed without using bedrails?: None Help needed moving to and from a bed to a chair (including a wheelchair)?: A Little Help needed standing up from a chair using your arms (e.g., wheelchair or bedside chair)?: A Little Help needed to walk in hospital room?: A Little Help needed climbing 3-5 steps with a railing? : A Little 6 Click Score: 20    End of Session  Equipment Utilized During Treatment: Gait belt Activity Tolerance: Patient tolerated treatment well Patient left: in bed;with call bell/phone within reach;with bed alarm set;with family/visitor present Nurse Communication: Mobility status;Precautions(via white board) PT Visit Diagnosis: Other abnormalities of gait and mobility (R26.89);Muscle weakness (generalized) (M62.81);Difficulty in walking, not elsewhere classified (R26.2)    Time: [id number] PT Time Calculation (min) (ACUTE ONLY): 25 min   Charges:   PT Evaluation $PT Eval Low Complexity: 1 Low         Akire Rennert, PT 04/29/19, 4:23 PM

## 2019-04-29 NOTE — Evaluation (Signed)
Occupational Therapy Evaluation Patient Details Name: Lauren Mccann MRN: [id number] DOB: [birth year] Today's Date: 04/29/2019    History of Present Illness Lauren Mccann  is a 86 y.o. pleasant Caucasian female with a known history of multiple medical problems including PE and atrial fibrillation on Xarelto,neuroendocrine tumor s/p right hemicolectomy on 10/29/2012 and chronic diarrhea secondary to carcinoid syndrome presented to the emergency room with acute onset of generalized weakness with associated dyspnea on exertion and occasional dizziness which have been going on since before Christmas and have worsened the last few days.  She was found to have iron deficient anemia with hemoglobin of 6.7.  No obvious sign of bleeding.  FOBT negative.  Given 2 units PRBC.   Clinical Impression   Patient seen this AM for OT evaluation after diagnosis of symptomatic anemia.  Patient agreeable to therapy but very lethargic and required extra time to perform tasks.  Patient able to perform bed mobility with MOD I (requiring extra time).  Patient tolerated sitting at EOB for ~10 minutes with poor/fair balance due to lethargy.  Performed sit to stand transfer with 2x trial; successful after education on body mechanics and sequencing.  CGA needed for sit to stand and SPT with use of RW.  Pulse up to 92bpm with functional transfer and ~80 with seated/supine activity.  Patient completed simple grooming tasks with set up at bed level; required CGA to complete while seated at EOB secondary to SOB and poor ability to keep self sitting up.  Patient states she has felt this way since before Christmas and has been unable to cook for herself.  Therapist recommending SNF based on today's performance.  The patient would greatly benefit from skilled occupational therapy to address activity tolerance, strengthening, balance, sitting/standing tolerance, ADL retraining, compensatory techniques and energy conservation techniques.       Follow Up Recommendations  SNF    Equipment Recommendations  Other (comment)(defer to next level of care)    Recommendations for Other Services       Precautions / Restrictions Precautions Precautions: Fall Restrictions Weight Bearing Restrictions: No      Mobility Bed Mobility Overal bed mobility: Modified Independent             General bed mobility comments: Requires extra time.  Transfers Overall transfer level: Needs assistance Equipment used: Rolling walker (2 wheeled) Transfers: Sit to/from [facility] Sit to Stand: Min guard(Trial x 2.  Successful after education on body mechanics and sequencing.) Stand pivot transfers: Min guard       General transfer comment: Education on body mechanics, sequencing and pacing.    Balance Overall balance assessment: Mild deficits observed, not formally tested                                         ADL either performed or assessed with clinical judgement   ADL Overall ADL's : Needs assistance/impaired     Grooming: Wash/dry hands;Wash/dry face;Oral care;Set up;Bed level   Upper Body Bathing: Min guard;Sitting           Lower Body Dressing: Moderate assistance;Cueing for safety;Sit to/from stand   Toilet Transfer: Min guard;Cueing for safety;Cueing for sequencing;Stand-pivot             General ADL Comments: Requires extra time for all tasks due to SOB, lethargy and dizziness.     Vision Patient Visual Report: No change from  baseline Vision Assessment?: No apparent visual deficits     Perception     Praxis      Pertinent Vitals/Pain Pain Assessment: No/denies pain     Hand Dominance Left   Extremity/Trunk Assessment Upper Extremity Assessment Upper Extremity Assessment: Generalized weakness   Lower Extremity Assessment Lower Extremity Assessment: Defer to PT evaluation       Communication Communication Communication: No difficulties   Cognition  Arousal/Alertness: Awake/alert Behavior During Therapy: WFL for tasks assessed/performed Overall Cognitive Status: Within Functional Limits for tasks assessed                                     General Comments       Exercises     Shoulder Instructions      Home Living Family/patient expects to be discharged to:: Other (Comment)(apartment in retirement community) Living Arrangements: Alone                                      Prior Functioning/Environment Level of Independence: Independent        Comments: Patient states she did all BADLs at MOD I (prior to Christmas), but often asked for assistance from family/neighbors for grocery shopping d/t the pandemic.  Patient states she typically used cane for functional mobility.        OT Problem List: Decreased strength;Decreased activity tolerance;Impaired balance (sitting and/or standing);Decreased knowledge of precautions      OT Treatment/Interventions: Self-care/ADL training;Therapeutic exercise;Energy conservation;Therapeutic activities;Patient/family education    OT Goals(Current goals can be found in the care plan section) Acute Rehab OT Goals Patient Stated Goal: "I want to not feel this way". OT Goal Formulation: With patient Time For Goal Achievement: 05/13/19 Potential to Achieve Goals: Good  OT Frequency: Min 2X/week   Barriers to D/C:            Co-evaluation              AM-PAC OT "6 Clicks" Daily Activity     Outcome Measure Help from another person eating meals?: None Help from another person taking care of personal grooming?: None Help from another person toileting, which includes using toliet, bedpan, or urinal?: A Little Help from another person bathing (including washing, rinsing, drying)?: A Lot Help from another person to put on and taking off regular upper body clothing?: A Little Help from another person to put on and taking off regular lower body  clothing?: A Lot 6 Click Score: 18   End of Session Equipment Utilized During Treatment: Rolling walker Nurse Communication: Precautions(Patient is no longer on enteric precautions as of 04/29/19)  Activity Tolerance: Patient limited by fatigue;Patient tolerated treatment well(Tolerated well, but required extra time d/t SOB.) Patient left: in bed;with call bell/phone within reach;with bed alarm set  OT Visit Diagnosis: Muscle weakness (generalized) (M62.81)                Time: [id number] OT Time Calculation (min): 31 min Charges:  OT General Charges $OT Visit: 1 Visit OT Evaluation $OT Eval Low Complexity: 1 Low OT Treatments $Self Care/Home Management : 8-22 mins $Therapeutic Activity: 8-22 mins  Baldomero Lamy, MS, OTR/L 04/29/19, 2:52 PM

## 2019-04-29 NOTE — Plan of Care (Signed)

## 2019-04-30 ENCOUNTER — Inpatient Hospital Stay: Payer: Medicare Other

## 2019-04-30 DIAGNOSIS — E538 Deficiency of other specified B group vitamins: Secondary | ICD-10-CM

## 2019-04-30 LAB — BPAM RBC
Blood Product Expiration Date: 202102282359
Blood Product Expiration Date: 202103212359
ISSUE DATE / TIME: 202102242030
ISSUE DATE / TIME: 202102250140
Unit Type and Rh: 600
Unit Type and Rh: 6200

## 2019-04-30 LAB — TYPE AND SCREEN
ABO/RH(D): A POS
Antibody Screen: NEGATIVE
Unit division: 0
Unit division: 0

## 2019-04-30 LAB — BASIC METABOLIC PANEL
Anion gap: 7 (ref 5–15)
BUN: 13 mg/dL (ref 8–23)
CO2: 22 mmol/L (ref 22–32)
Calcium: 8.7 mg/dL — ABNORMAL LOW (ref 8.9–10.3)
Chloride: 110 mmol/L (ref 98–111)
Creatinine, Ser: 1.08 mg/dL — ABNORMAL HIGH (ref 0.44–1.00)
GFR calc Af Amer: 54 mL/min — ABNORMAL LOW (ref >60)
GFR calc non Af Amer: 46 mL/min — ABNORMAL LOW (ref >60)
Glucose, Bld: 90 mg/dL (ref 70–99)
Potassium: 4.7 mmol/L (ref 3.5–5.1)
Sodium: 139 mmol/L (ref 135–145)

## 2019-04-30 LAB — CBC
HCT: 28.8 % — ABNORMAL LOW (ref 36.0–46.0)
Hemoglobin: 8.9 g/dL — ABNORMAL LOW (ref 12.0–15.0)
MCH: 24.6 pg — ABNORMAL LOW (ref 26.0–34.0)
MCHC: 30.9 g/dL (ref 30.0–36.0)
MCV: 79.6 fL — ABNORMAL LOW (ref 80.0–100.0)
Platelets: 258 10*3/uL (ref 150–400)
RBC: 3.62 MIL/uL — ABNORMAL LOW (ref 3.87–5.11)
RDW: 16.4 % — ABNORMAL HIGH (ref 11.5–15.5)
WBC: 8.3 10*3/uL (ref 4.0–10.5)
nRBC: 0 % (ref 0.0–0.2)

## 2019-04-30 MED ORDER — CYANOCOBALAMIN 1000 MCG PO TABS: 1000.0000 ug | ORAL_TABLET | Freq: Every day | ORAL | 0 refills | Status: AC

## 2019-04-30 MED ORDER — SULFAMETHOXAZOLE-TRIMETHOPRIM 400-80 MG PO TABS: 1.0000 | ORAL_TABLET | Freq: Two times a day (BID) | ORAL | 0 refills | Status: AC

## 2019-04-30 MED ORDER — IOHEXOL 300 MG/ML  SOLN
100.0000 mL | Freq: Once | INTRAMUSCULAR | Status: AC | PRN
  Administered 2019-04-30: 100 mL via INTRAVENOUS

## 2019-04-30 MED ORDER — FERROUS SULFATE 325 (65 FE) MG PO TABS: 325.0000 mg | ORAL_TABLET | Freq: Three times a day (TID) | ORAL | 3 refills | Status: AC

## 2019-04-30 MED ORDER — IOHEXOL 9 MG/ML PO SOLN
500.0000 mL | ORAL | Status: AC
  Administered 2019-04-30 (×2): 500 mL via ORAL

## 2019-04-30 NOTE — Progress Notes (Signed)
Occupational Therapy Treatment Patient Details Name: LEVEE GARROD MRN: [id number] DOB: [birth year] Today's Date: 04/30/2019    History of present illness Pt is an 86 y.o. female presenting to hospital 04/28/19 with generalized weakness, anemia, SOB, and feeling "drunk" when she is up walking for past several days; (pt has had symptoms since before Christmas and worsened last few days).  No obvious signs of bleeding.  FOBT negative.  Pt admitted with symptomatic microcytic anemia (initial Hgb 6.7), hypokalemia, and possible UTI.  S/p blood transfusions.  PMH includes h/o chronic diarrhea s/p colectomy, h/o DVT PE, a-fib, breast CA s/p B mastectomy's, fibromyalgia, HA, h/o hip fx requiring operative repair, IBS, neuropathy, stroke, vertigo, IVC filter.   OT comments  Patient seen this AM for OT session.  Patient initially disagreeable to therapy due to nausea, but stated she would participate after encouragement from therapist.  Patient moved to sit at EOB with MOD I and cues for bed controls.  Targeted toileting ADL retraining this date addressing functional transfers, safety, hygiene and clothing management.  Patient required more assist than typical this date (~MOD A) due to severity of BM.  Communicated with nurse and MD; patient on medication for a scan later this afternoon which is affecting her bowels and nausea.  Per MD, patient is also off of enteric precautions.  Discussed possible need for aide within home to assist with meal preparation with MD.  MD agreeable.  Patient required CGA for functional SPTs with use of bed railing as UE support.  Demonstrated improved activity tolerance and flexibility needed for safe ADL functioning. Pulse remained below 90bpm during functional activities.  Pt would continue to benefit from skilled OT services to address afore mentioned performance components.  Recommending HH OT as follow up to discharge at this time due to improvement in patient's  participation.    Follow Up Recommendations  Home health OT(BAsed on today's performance.  Demonstrated improvement in strength and activity tolerance.)    Equipment Recommendations  None recommended by OT    Recommendations for Other Services      Precautions / Restrictions Precautions Precautions: Fall Precaution Comments: Patient is no longer on enteric precautions after verbal confirmation from MD Restrictions Weight Bearing Restrictions: No       Mobility Bed Mobility Overal bed mobility: Modified Independent             General bed mobility comments: Required cue to lower bed flat to sit effectively on side  Transfers Overall transfer level: Needs assistance Equipment used: None Transfers: Sit to/from Stand;Stand Pivot Transfers Sit to Stand: Min guard Stand pivot transfers: Min guard       General transfer comment: Cues for self pacing.  Able to use bed railing for stability during pivot.    Balance Overall balance assessment: Needs assistance Sitting-balance support: No upper extremity supported;Feet supported       Standing balance support: No upper extremity supported Standing balance-Leahy Scale: Good Standing balance comment: Able to reach for perineal hygiene after toileting                           ADL either performed or assessed with clinical judgement   ADL Overall ADL's : Needs assistance/impaired     Grooming: Wash/dry hands;Wash/dry face;Oral care;Set up;Bed level       Lower Body Bathing: Moderate assistance;Sitting/lateral leans;Cueing for compensatory techniques;Cueing for sequencing;Cueing for safety       Lower Body Dressing: Moderate assistance;Cueing  for safety;Sit to/from stand   Toilet Transfer: Min guard;Cueing for safety;Cueing for sequencing;Stand-pivot   Toileting- Water quality scientist and Hygiene: Min guard;Cueing for safety;Cueing for sequencing;Sit to/from stand         General ADL Comments:  Demonstrated need for less amount of time to complete tasks and able to forward bend to feet for dressing and hygiene.     Vision       Perception     Praxis      Cognition Arousal/Alertness: Awake/alert Behavior During Therapy: WFL for tasks assessed/performed Overall Cognitive Status: Within Functional Limits for tasks assessed                                          Exercises     Shoulder Instructions       General Comments Patient taking medication for scan later this date, resulting in frequent bowel movements and nausea.    Pertinent Vitals/ Pain       Pain Assessment: No/denies pain  Home Living                                          Prior Functioning/Environment              Frequency  Min 2X/week        Progress Toward Goals  OT Goals(current goals can now be found in the care plan section)  Progress towards OT goals: Progressing toward goals     Plan Discharge plan needs to be updated    Co-evaluation                 AM-PAC OT "6 Clicks" Daily Activity     Outcome Measure   Help from another person eating meals?: None Help from another person taking care of personal grooming?: None Help from another person toileting, which includes using toliet, bedpan, or urinal?: A Lot(Required more assistance this date due to intensity of BM.) Help from another person bathing (including washing, rinsing, drying)?: A Lot(Required extra assist due to intensity of BM) Help from another person to put on and taking off regular upper body clothing?: None Help from another person to put on and taking off regular lower body clothing?: A Little 6 Click Score: 19    End of Session Equipment Utilized During Treatment: Gait belt  OT Visit Diagnosis: Muscle weakness (generalized) (M62.81)   Activity Tolerance Patient tolerated treatment well;Patient limited by fatigue;Other (comment)(Limited by need to use restroom)    Patient Left with call bell/phone within reach;with nursing/sitter in room;Other (comment)(Patient on [facility])   Nurse Communication Other (comment);Precautions(Discussed enteric precautions with nurse and MD.  Discussed bowel movements with nurse and nausea.)        Time: [id number] OT Time Calculation (min): 49 min  Charges: OT General Charges $OT Visit: 1 Visit OT Treatments $Self Care/Home Management : 38-52 mins  Baldomero Lamy, MS, OTR/L 04/30/19, 12:23 PM

## 2019-04-30 NOTE — Discharge Summary (Signed)
Physician Discharge Summary  Lauren Mccann [id number] DOB: 07/13/1930 DOA: 04/28/2019  PCP: Glendon Axe, MD  Admit date: 04/28/2019 Discharge date: 04/30/2019  Admitted From: Home Disposition: Home   Recommendations for Outpatient Follow-up:  1. Follow up with PCP in 1-2 weeks 2. Please obtain BMP/CBC in one week 3. Please follow up on the following pending results: Urine culture sensitivity results.  Home Health: Yes Equipment/Devices: Rolling walker Discharge Condition: Stable CODE STATUS: Full Diet recommendation: Heart Healthy   Brief/Interim Summary: [name] a88 y.o.pleasant Caucasian femalewith a known history of multiple medical problems including PE and atrial fibrillation on Xarelto,neuroendocrine tumors/p right hemicolectomy on 10/29/2012 and chronic diarrhea secondary to carcinoid syndrome presented to the emergency room with acute onset of generalized weakness with associated dyspnea on exertion and occasional dizziness which have been going on since before Christmas and have worsened the last few days.  She was found to have iron deficient anemia with hemoglobin of 6.7.  No obvious sign of bleeding.  FOBT negative.  Given 2 units PRBC.  Hemoglobin stable around 8.9 on discharge. She was also given 1 dose of IV Feraheme and discharged home on iron supplement.  It was thought to be due to poor diet as patient was unable to fix meals and mostly living on cookies and crackers for many months.  We arranged for home health services including aide to help her with her strength and ADLs.  We also consulted hematology and oncology, seen by Dr. Nolon Stalls due to her history of neuroendocrine tumor and carcinoid syndrome.  Repeat CT abdomen was without any acute findings.  She will follow-up with her as an outpatient.  Patient also has chronic diarrhea secondary to her carcinoid syndrome which was treated for 2 years before by oncology.  She will follow-up  with oncology for further management.  Electrolytes were within normal limit on discharge.  Her UA was concerning for UTI, urine culture is growing E. Coli, patient was mostly asymptomatic except generalized weakness which can be a sign of UTI in elderly.  She was treated with Bactrim and sent home for 3 more days of Bactrim. Patient remained asymptomatic and afebrile.  Patient was on Xarelto due to her history of PE, DVT and A. fib.  We held Xarelto for a day and then resume after finding no obvious sign of bleeding.  She will continue with her home meds and follow-up with PCP.  Discharge Diagnoses:  Active Problems:   Symptomatic anemia  Discharge Instructions  Discharge Instructions    Diet - low sodium heart healthy   Complete by: As directed    Discharge instructions   Complete by: As directed    It was pleasure taking care of you. It is very important that you improve your diet and eat healthy choices. We are arranging some home health services for you. Please follow-up with hematology and your primary care physician. Continue taking your iron and B12 supplement.   Increase activity slowly   Complete by: As directed      Allergies as of 04/30/2019      Reactions   Codeine Shortness Of Breath, Other (See Comments)   GI Upset  Pt states "makes heart flutter and trouble breathing" Other reaction(s): Other (See Comments) Chest Tightness, SOB, Tachycardia GI Upset  Pt states "makes heart flutter and trouble breathing" Other reaction(s): Other (See Comments) Chest Tightness, SOB, Tachycardia GI Upset  Pt states "makes heart flutter and trouble breathing"   Propoxyphene Shortness Of Breath  Other reaction(s): Other (See Comments) GI Upset   Alendronate Sodium    Other reaction(s): Other (See Comments) GI Upset   Atorvastatin    Other reaction(s): Other (See Comments) Myalgia   Benadryl [diphenhydramine Hcl] Other (See Comments)   Restless legs   Cefuroxime Axetil  Nausea Only   Cymbalta [duloxetine Hcl]    Diphenhydramine    Lipitor [atorvastatin Calcium]    Prednisone    Other reaction(s): Other (See Comments) Chest Pain, SOB, "can't breathe good" Unable to sleep   Levofloxacin Rash, Other (See Comments)   Altered mental status      Medication List    TAKE these medications   acetaminophen 325 MG tablet Commonly known as: TYLENOL Take 650 mg by mouth every 4 (four) hours as needed for mild pain or fever.   ALPRAZolam 1 MG tablet Commonly known as: XANAX Take 0.5 mg by mouth 2 (two) times daily as needed for anxiety.   cyanocobalamin 1000 MCG tablet Take 1 tablet (1,000 mcg total) by mouth daily.   ferrous sulfate 325 (65 FE) MG tablet Take 1 tablet (325 mg total) by mouth in the morning, at noon, and at bedtime.   levothyroxine 25 MCG tablet Commonly known as: SYNTHROID Take 25 mcg by mouth daily.   loperamide 2 MG capsule Commonly known as: IMODIUM Take 2-4 mg by mouth 2 (two) times daily as needed for diarrhea or loose stools.   metoprolol succinate 50 MG 24 hr tablet Commonly known as: TOPROL-XL Take 50 mg by mouth daily.   rivaroxaban 20 MG Tabs tablet Commonly known as: XARELTO Take 1 tablet (20 mg total) by mouth daily. What changed: when to take this   sertraline 100 MG tablet Commonly known as: ZOLOFT Take 150 mg by mouth daily.   sulfamethoxazole-trimethoprim 400-80 MG tablet Commonly known as: BACTRIM Take 1 tablet by mouth every 12 (twelve) hours for 3 days.            Durable Medical Equipment  (From admission, onward)         Start     Ordered   04/30/19 0737  For home use only DME Walker rolling  Once    Question Answer Comment  Walker: With [place] Wheels   Patient needs a walker to treat with the following condition Generalized weakness      04/30/19 0736         Follow-up Information    Glendon Axe, MD. Schedule an appointment as soon as possible for a visit.   Specialty: Internal  Medicine Contact information: [place] [address] [phone]        Lequita Asal, MD. Schedule an appointment as soon as possible for a visit.   Specialty: Hematology and Oncology Contact information: [place] [address] [phone]          Allergies  Allergen Reactions  . Codeine Shortness Of Breath and Other (See Comments)    GI Upset  Pt states "makes heart flutter and trouble breathing" Other reaction(s): Other (See Comments) Chest Tightness, SOB, Tachycardia GI Upset  Pt states "makes heart flutter and trouble breathing" Other reaction(s): Other (See Comments) Chest Tightness, SOB, Tachycardia GI Upset  Pt states "makes heart flutter and trouble breathing"  . Propoxyphene Shortness Of Breath    Other reaction(s): Other (See Comments) GI Upset  . Alendronate Sodium     Other reaction(s): Other (See Comments) GI Upset  . Atorvastatin  Other reaction(s): Other (See Comments) Myalgia  . Benadryl [Diphenhydramine Hcl] Other (See Comments)    Restless legs  . Cefuroxime Axetil Nausea Only  . Cymbalta [Duloxetine Hcl]   . Diphenhydramine   . Lipitor [Atorvastatin Calcium]   . Prednisone     Other reaction(s): Other (See Comments) Chest Pain, SOB, "can't breathe good" Unable to sleep  . Levofloxacin Rash and Other (See Comments)    Altered mental status    Consultations:  Hematology/oncology  Procedures/Studies: CT ABDOMEN PELVIS W CONTRAST  Result Date: 04/30/2019 CLINICAL DATA:  Patient with history of carcinoid syndrome and RIGHT hemicolectomy for neuroendocrine tumor. Progressive weakness. Chronic diarrhea. EXAM: CT ABDOMEN AND PELVIS WITH CONTRAST TECHNIQUE: Multidetector CT imaging of the abdomen and pelvis was performed using the standard protocol following bolus administration of intravenous contrast. CONTRAST:  132mL OMNIPAQUE IOHEXOL 300 MG/ML  SOLN COMPARISON:  03/05/2016 FINDINGS: Lower  chest: Lung bases are clear. Hepatobiliary: No focal hepatic lesion. No biliary duct dilatation. Gallbladder is normal. Common bile duct is normal. Pancreas: Pancreas is normal. No ductal dilatation. No pancreatic inflammation. Spleen: Normal spleen Adrenals/urinary tract: Adrenal glands and kidneys are normal. The ureters and bladder normal. Stomach/Bowel: Moderate size hiatal hernia. Stomach and duodenum normal. Small bowel normal. No small bowel lesion or obstruction. Partial RIGHT hemicolectomy anatomy. Transverse and descending colon normal. Multiple diverticula of the sigmoid colon without acute inflammation. Rectum normal. There is no mesenteric mass.  No peritoneal nodularity. Vascular/Lymphatic: Abdominal aorta is normal caliber with atherosclerotic calcification. There is no retroperitoneal or periportal lymphadenopathy. No pelvic lymphadenopathy. Infrarenal IVC filter noted. Reproductive: Post hysterectomy Other: Trace fluid in the pelvis Musculoskeletal: No aggressive osseous lesion. IMPRESSION: 1. Post RIGHT hemicolectomy anatomy.  No complicating features. 2. No evidence of carcinoid tumor recurrence in the bowel, mesentery or liver. 3. Infrarenal IVC filter noted. 4. Diverticulosis of sigmoid colon without evidence acute diverticulitis. Electronically Signed   By: Suzy Bouchard M.D.   On: 04/30/2019 12:39   DG Chest Portable 1 View  Result Date: 04/28/2019 CLINICAL DATA:  Short of breath EXAM: PORTABLE CHEST 1 VIEW COMPARISON:  07/02/2014 FINDINGS: Single frontal view of the chest demonstrates stable enlarged cardiac silhouette. Aortic atherosclerosis unchanged. Chronic scarring throughout the lungs without focal consolidation, effusion, or pneumothorax. Vasculature is normal. IMPRESSION: 1. Stable exam, no acute process. Electronically Signed   By: Randa Ngo M.D.   On: 04/28/2019 19:34     Subjective: Patient was having some loose bowel movements after drinking oral contrast for CT  abdomen.  No other complaints.  Discharge Exam: Vitals:   04/30/19 0941 04/30/19 1232  BP:  133/64  Pulse: 85 70  Resp:  18  Temp:  98.2 F (36.8 C)  SpO2: 95% 95%   Vitals:   04/30/19 0619 04/30/19 0917 04/30/19 0941 04/30/19 1232  BP: (!) 159/81 138/63  133/64  Pulse: 69 76 85 70  Resp: 17   18  Temp: 98.4 F (36.9 C)   98.2 F (36.8 C)  TempSrc: Oral   Oral  SpO2: 95% 97% 95% 95%  Weight:      Height:        General: Pt is alert, awake, not in acute distress Cardiovascular: RRR, S1/S2 +, no rubs, no gallops Respiratory: CTA bilaterally, no wheezing, no rhonchi Abdominal: Soft, NT, ND, bowel sounds + Extremities: no edema, no cyanosis   The results of significant diagnostics from this hospitalization (including imaging, microbiology, ancillary and laboratory) are listed below for reference.  Microbiology: Recent Results (from the past 240 hour(s))  Urine Culture     Status: Abnormal (Preliminary result)   Collection Time: 04/28/19  5:48 PM   Specimen: Urine, Catheterized  Result Value Ref Range Status   Specimen Description   Final    URINE, CATHETERIZED Performed at [facility], [address], [place], [place] [address]    Special Requests   Final    NONE Performed at [facility], [address] [place], [place] [address]    Culture (A)  Final    >=100,000 COLONIES/mL ESCHERICHIA COLI SUSCEPTIBILITIES TO FOLLOW Performed at [facility], [place] [address] [place], [place] [address]    Report Status PENDING  Incomplete  SARS CORONAVIRUS 2 (TAT 6-24 HRS) Nasopharyngeal Nasopharyngeal Swab     Status: None   Collection Time: 04/28/19  7:27 PM   Specimen: Nasopharyngeal Swab  Result Value Ref Range Status   SARS Coronavirus 2 NEGATIVE NEGATIVE Final    Comment: (NOTE) SARS-CoV-2 target nucleic acids are NOT DETECTED. The SARS-CoV-2 RNA is generally detectable in upper and lower respiratory specimens during the acute phase of  infection. Negative results do not preclude SARS-CoV-2 infection, do not rule out co-infections with other pathogens, and should not be used as the sole basis for treatment or other patient management decisions. Negative results must be combined with clinical observations, patient history, and epidemiological information. The expected result is Negative. Fact Sheet for Patients: SugarRoll.be Fact Sheet for Healthcare Providers: https://www.woods-mathews.com/ This test is not yet approved or cleared by the [place] FDA and  has been authorized for detection and/or diagnosis of SARS-CoV-2 by FDA under an Emergency Use Authorization (EUA). This EUA will remain  in effect (meaning this test can be used) for the duration of the COVID-19 declaration under Section 56 4(b)(1) of the Act, 21 U.S.C. section 360bbb-3(b)(1), unless the authorization is terminated or revoked sooner. Performed at [facility], [place] [address] [place], [place] [address]   C difficile quick scan w PCR reflex     Status: None   Collection Time: 04/29/19 10:37 AM   Specimen: STOOL  Result Value Ref Range Status   C Diff antigen NEGATIVE NEGATIVE Final   C Diff toxin NEGATIVE NEGATIVE Final   C Diff interpretation No C. difficile detected.  Final    Comment: Performed at [facility], [address] [place], [place] [address]     Labs: BNP (last 3 results) No results for input(s): BNP in the last 8760 hours. Basic Metabolic Panel: Recent Labs  Lab 04/28/19 1745 04/28/19 2312 04/29/19 0442 04/30/19 0332  NA 137  --  139 139  K 3.4*  --  4.3 4.7  CL 103  --  107 110  CO2 23  --  23 22  GLUCOSE 108*  --  86 90  BUN 16  --  13 13  CREATININE 1.00  --  0.95 1.08*  CALCIUM 9.2  --  8.7* 8.7*  MG  --  2.0  --   --    Liver Function Tests: No results for input(s): AST, ALT, ALKPHOS, BILITOT, PROT, ALBUMIN in the last 168 hours. No results for  input(s): LIPASE, AMYLASE in the last 168 hours. No results for input(s): AMMONIA in the last 168 hours. CBC: Recent Labs  Lab 04/28/19 1745 04/28/19 1745 04/28/19 2312 04/29/19 0144 04/29/19 0442 04/29/19 1033 04/30/19 0332  WBC 8.7  --   --   --  7.2  --  8.3  HGB 6.7*   < > 7.8* 7.4* 9.0* 9.4* 8.9*  HCT 23.4*   < > 25.3* 23.8* 28.5* 29.2* 28.8*  MCV 77.2*  --   --   --  78.3*  --  79.6*  PLT 389  --   --   --  299  --  258   < > = values in this interval not displayed.   Cardiac Enzymes: No results for input(s): CKTOTAL, CKMB, CKMBINDEX, TROPONINI in the last 168 hours. BNP: Invalid input(s): POCBNP CBG: No results for input(s): GLUCAP in the last 168 hours. D-Dimer No results for input(s): DDIMER in the last 72 hours. Hgb A1c No results for input(s): HGBA1C in the last 72 hours. Lipid Profile No results for input(s): CHOL, HDL, LDLCALC, TRIG, CHOLHDL, LDLDIRECT in the last 72 hours. Thyroid function studies Recent Labs    04/28/19 2312  TSH 4.127   Anemia work up Recent Labs    04/28/19 1947  VITAMINB12 269  FOLATE 32.0  FERRITIN 4*  TIBC 462*  IRON 9*  RETICCTPCT 1.1   Urinalysis    Component Value Date/Time   COLORURINE YELLOW (A) 04/28/2019 1745   APPEARANCEUR HAZY (A) 04/28/2019 1745   APPEARANCEUR Clear 04/17/2016 1319   LABSPEC 1.021 04/28/2019 1745   LABSPEC 1.020 03/10/2013 1507   PHURINE 5.0 04/28/2019 1745   GLUCOSEU NEGATIVE 04/28/2019 1745   GLUCOSEU Negative 03/10/2013 1507   HGBUR NEGATIVE 04/28/2019 1745   BILIRUBINUR NEGATIVE 04/28/2019 1745   BILIRUBINUR Negative 04/17/2016 1319   BILIRUBINUR Negative 03/10/2013 1507   KETONESUR NEGATIVE 04/28/2019 1745   PROTEINUR NEGATIVE 04/28/2019 1745   NITRITE POSITIVE (A) 04/28/2019 1745   LEUKOCYTESUR NEGATIVE 04/28/2019 1745   LEUKOCYTESUR 2+ 03/10/2013 1507   Sepsis Labs Invalid input(s): PROCALCITONIN,  WBC,  LACTICIDVEN Microbiology Recent Results (from the past 240 hour(s))   Urine Culture     Status: Abnormal (Preliminary result)   Collection Time: 04/28/19  5:48 PM   Specimen: Urine, Catheterized  Result Value Ref Range Status   Specimen Description   Final    URINE, CATHETERIZED Performed at [facility], [address] [place], [place] [address]    Special Requests   Final    NONE Performed at [facility], [address] [place], [place] [address]    Culture (A)  Final    >=100,000 COLONIES/mL ESCHERICHIA COLI SUSCEPTIBILITIES TO FOLLOW Performed at [facility], [place] [address], [place], [place] [address]    Report Status PENDING  Incomplete  SARS CORONAVIRUS 2 (TAT 6-24 HRS) Nasopharyngeal Nasopharyngeal Swab     Status: None   Collection Time: 04/28/19  7:27 PM   Specimen: Nasopharyngeal Swab  Result Value Ref Range Status   SARS Coronavirus 2 NEGATIVE NEGATIVE Final    Comment: (NOTE) SARS-CoV-2 target nucleic acids are NOT DETECTED. The SARS-CoV-2 RNA is generally detectable in upper and lower respiratory specimens during the acute phase of infection. Negative results do not preclude SARS-CoV-2 infection, do not rule out co-infections with other pathogens, and should not be used as the sole basis for treatment or other patient management decisions. Negative results must be combined with clinical observations, patient history, and epidemiological information. The expected result is Negative. Fact Sheet for Patients: SugarRoll.be Fact Sheet for Healthcare Providers: https://www.woods-mathews.com/ This test is not yet approved or cleared by the [place] FDA and  has been authorized for detection and/or diagnosis of SARS-CoV-2 by FDA under an  Emergency Use Authorization (EUA). This EUA will remain  in effect (meaning this test can be used) for the duration of the COVID-19 declaration under Section 56 4(b)(1) of the Act, 21 U.S.C. section 360bbb-3(b)(1), unless the  authorization is terminated or revoked sooner. Performed at [facility], [place] [address], [place], [place] [address]   C difficile quick scan w PCR reflex     Status: None   Collection Time: 04/29/19 10:37 AM   Specimen: STOOL  Result Value Ref Range Status   C Diff antigen NEGATIVE NEGATIVE Final   C Diff toxin NEGATIVE NEGATIVE Final   C Diff interpretation No C. difficile detected.  Final    Comment: Performed at [facility], [address] [place], [place] [address]    Time coordinating discharge: Over 30 minutes  SIGNED:  Lorella Nimrod, MD  [facility] 04/30/2019, 12:51 PM  If 7PM-7AM, please contact night-coverage [email or web address]  This record has been created using Systems analyst. Errors have been sought and corrected,but may not always be located. Such creation errors do not reflect on the standard of care.

## 2019-04-30 NOTE — Care Management Important Message (Signed)
Important Message  Patient Details  Name: Lauren Mccann MRN: [id number] Date of Birth: [birth year]   Medicare Important Message Given:  Yes   Initial Medicare IM given by Patient Access Associate on 04/29/2019 at 12:42pm.     Dannette Barbara 04/30/2019, 8:22 AM

## 2019-04-30 NOTE — TOC Initial Note (Signed)
Transition of Care [facility]) - Initial/Assessment Note    Patient Details  Name: Lauren Mccann MRN: [id number] Date of Birth: 01-08-1931  Transition of Care [facility]) CM/SW Contact:    Eileen Stanford, LCSW Phone Number: 04/30/2019, 12:57 PM  Clinical Narrative:   Pt is in isolation. CSW was unsuccessful at reaching pt in the room. CSW spoke with pt's granddaughter via telephone. Pt's granddaughter states pt lives alone in income based house community. Pt's granddaughter states her neighbors check on her frequently. Pt has a walker and lift chair at home. Pt's granddaughter chooses Company secretary. [facility] will accept.                 Expected Discharge Plan: [place] Barriers to Discharge: No Barriers Identified   Patient Goals and CMS Choice        Expected Discharge Plan and Services Expected Discharge Plan: [place] In-house Referral: NA   Post Acute Care Choice: [place] arrangements for the past 2 months: [place] Expected Discharge Date: 04/30/19                         HH Arranged: PT, RN, Nurse's Aide HH Agency: [facility] Date [place] Agency Contacted: 04/30/19 Time [place]: 43 Representative spoke with at [place]: brittany  Prior Living Arrangements/Services Living arrangements for the past 2 months: [place] with:: Self Patient language and need for interpreter reviewed:: Yes Do you feel safe going back to the place where you live?: Yes      Need for Family Participation in Patient Care: Yes (Comment) Care giver support system in place?: Yes (comment)   Criminal Activity/Legal Involvement Pertinent to Current Situation/Hospitalization: No - Comment as needed  Activities of Daily Living Home Assistive Devices/Equipment: Walker (specify type), Cane (specify quad or straight), Grab bars in shower, Grab bars around toilet ADL Screening (condition at time of admission) Patient's  cognitive ability adequate to safely complete daily activities?: Yes Is the patient deaf or have difficulty hearing?: No Does the patient have difficulty seeing, even when wearing glasses/contacts?: No Does the patient have difficulty concentrating, remembering, or making decisions?: No Patient able to express need for assistance with ADLs?: Yes Does the patient have difficulty dressing or bathing?: Yes Independently performs ADLs?: Yes (appropriate for developmental age) Does the patient have difficulty walking or climbing stairs?: Yes Weakness of Legs: Both Weakness of Arms/Hands: Both  Permission Sought/Granted Permission sought to share information with : Family Supports    Share Information with NAME: Dalbert Mayotte  Permission granted to share info w AGENCY: [employer]  Permission granted to share info w Relationship: granddaughter     Emotional Assessment Appearance:: Appears stated age Attitude/Demeanor/Rapport: Unable to Assess Affect (typically observed): Unable to Assess Orientation: : Oriented to Self, Oriented to Place, Oriented to  Time, Oriented to Situation Alcohol / Substance Use: Not Applicable Psych Involvement: No (comment)  Admission diagnosis:  Symptomatic anemia [D64.9] Patient Active Problem List   Diagnosis Date Noted  . Symptomatic anemia 04/28/2019  . Diarrhea 07/29/2016  . Occipital neuralgia (Location of Primary Source of Pain) (Bilateral) (R>L) 11/29/2015  . Chronic knee pain (Bilateral) (R>L) 11/29/2015  . Chronic abdominal pain (RLQ) 11/29/2015  . 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 Chronic anticoagulation with Xarelto (stroke/pulmonary embolism) 11/29/2015  . History of breast cancer 11/29/2015  . Hyperlipidemia 11/28/2015  . Chronic pain 11/28/2015  . Long  term current use of opiate analgesic 11/28/2015  . Long term prescription opiate use 11/28/2015  . Opiate use (15 MME/Day) 11/28/2015  . Long term prescription benzodiazepine use 11/28/2015   . Unilateral occipital headache (Right) 11/28/2015  . Multilevel spine pain (Location of Tertiary source of pain) (lumbar > thoracic) 11/28/2015  . Encounter for therapeutic drug level monitoring 11/28/2015  . Encounter for pain management planning 11/28/2015  . Chronic neck pain (Location of Secondary source of pain) (Bilateral) (R>L) 11/28/2015  . Cervical spine pain 11/28/2015  . Thoracic spine pain 11/28/2015  . Lumbar spine pain 11/28/2015  . Itching 10/17/2015  . Urinary incontinence in female 10/17/2015  . Pure hypercholesterolemia 07/12/2015  . Fibromyalgia 02/08/2015  . 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 Neuroendocrine tumor 02/08/2015  . Anemia, iron deficiency 11/01/2014  . Osteoarthritis of knee (Bilateral) (R>L) 05/23/2014  . Acquired hypothyroidism 05/14/2014  . Benign essential HTN 05/14/2014  . Anxiety and depression 05/14/2014  . Malignant neoplasm of breast ([place]) 03/11/2014  . 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 Pulmonary embolism ([place]) 03/10/2014  . Depression 03/10/2014   PCP:  Glendon Axe, MD Pharmacy:   [place], [place] [address] [place] [address] Phone: [phone] Fax: [place], [place] - [address] [place] [address] Phone: [phone] Fax: [phone]  [place], [place] - [place] [address] Phone: [phone] Fax: [phone]     Social Determinants of Health (SDOH) Interventions    Readmission Risk Interventions No flowsheet data found.

## 2019-04-30 NOTE — Progress Notes (Signed)
Lauren Mccann to be D/C'd Home per MD order.  Discussed prescriptions and follow up appointments with the patient. Prescriptions given to patient, medication list explained in detail. Pt verbalized understanding.  Allergies as of 04/30/2019      Reactions   Codeine Shortness Of Breath, Other (See Comments)   GI Upset  Pt states "makes heart flutter and trouble breathing" Other reaction(s): Other (See Comments) Chest Tightness, SOB, Tachycardia GI Upset  Pt states "makes heart flutter and trouble breathing" Other reaction(s): Other (See Comments) Chest Tightness, SOB, Tachycardia GI Upset  Pt states "makes heart flutter and trouble breathing"   Propoxyphene Shortness Of Breath   Other reaction(s): Other (See Comments) GI Upset   Alendronate Sodium    Other reaction(s): Other (See Comments) GI Upset   Atorvastatin    Other reaction(s): Other (See Comments) Myalgia   Benadryl [diphenhydramine Hcl] Other (See Comments)   Restless legs   Cefuroxime Axetil Nausea Only   Cymbalta [duloxetine Hcl]    Diphenhydramine    Lipitor [atorvastatin Calcium]    Prednisone    Other reaction(s): Other (See Comments) Chest Pain, SOB, "can't breathe good" Unable to sleep   Levofloxacin Rash, Other (See Comments)   Altered mental status      Medication List    TAKE these medications   acetaminophen 325 MG tablet Commonly known as: TYLENOL Take 650 mg by mouth every 4 (four) hours as needed for mild pain or fever.   ALPRAZolam 1 MG tablet Commonly known as: XANAX Take 0.5 mg by mouth 2 (two) times daily as needed for anxiety.   cyanocobalamin 1000 MCG tablet Take 1 tablet (1,000 mcg total) by mouth daily.   ferrous sulfate 325 (65 FE) MG tablet Take 1 tablet (325 mg total) by mouth in the morning, at noon, and at bedtime.   levothyroxine 25 MCG tablet Commonly known as: SYNTHROID Take 25 mcg by mouth daily.   loperamide 2 MG capsule Commonly known as: IMODIUM Take 2-4 mg by  mouth 2 (two) times daily as needed for diarrhea or loose stools.   metoprolol succinate 50 MG 24 hr tablet Commonly known as: TOPROL-XL Take 50 mg by mouth daily.   rivaroxaban 20 MG Tabs tablet Commonly known as: XARELTO Take 1 tablet (20 mg total) by mouth daily. What changed: when to take this   sertraline 100 MG tablet Commonly known as: ZOLOFT Take 150 mg by mouth daily.   sulfamethoxazole-trimethoprim 400-80 MG tablet Commonly known as: BACTRIM Take 1 tablet by mouth every 12 (twelve) hours for 3 days.            Durable Medical Equipment  (From admission, onward)         Start     Ordered   04/30/19 0737  For home use only DME Walker rolling  Once    Question Answer Comment  Walker: With 5 Inch Wheels   Patient needs a walker to treat with the following condition Generalized weakness      04/30/19 0736          Vitals:   04/30/19 0941 04/30/19 1232  BP:  133/64  Pulse: 85 70  Resp:  18  Temp:  98.2 F (36.8 C)  SpO2: 95% 95%    Skin clean, dry and intact without evidence of skin break down, no evidence of skin tears noted. IV catheter discontinued intact. Site without signs and symptoms of complications. Dressing and pressure applied. Pt denies pain at this time. No  complaints noted.  An After Visit Summary was printed and given to the patient. Patient escorted via [place], and D/C home via private auto.  Fuller Mandril, RN

## 2019-05-01 LAB — URINE CULTURE: Culture: >=100000 — AB

## 2019-05-03 LAB — GI PATHOGEN PANEL BY PCR, STOOL

## 2019-05-03 LAB — CHROMOGRANIN A: Chromogranin A (ng/mL): 64.8 ng/mL (ref 0.0–101.8)

## 2019-05-07 ENCOUNTER — Other Ambulatory Visit: Payer: Self-pay | Admitting: Hematology and Oncology

## 2019-05-07 ENCOUNTER — Inpatient Hospital Stay: Payer: Medicare Other | Admitting: Hematology and Oncology

## 2019-05-07 DIAGNOSIS — C7A012 Malignant carcinoid tumor of the ileum: Secondary | ICD-10-CM | POA: Insufficient documentation

## 2019-05-07 LAB — 5 HIAA, QUANTITATIVE, URINE, 24 HOUR
5-HIAA, Ur: 1.1 mg/L
5-HIAA,Quant.,24 Hr Urine: 1.5 mg/24 hr (ref 0.0–14.9)

## 2019-05-12 ENCOUNTER — Inpatient Hospital Stay: Payer: Medicare Other | Admitting: Oncology

## 2019-05-18 NOTE — Progress Notes (Signed)
[facility]  [address] [place], [place] [address] Phone: [phone]  Fax: [phone]   Clinic Day:  05/20/2019  Referring physician: Glendon Axe, MD  Chief Complaint: Lauren Mccann is a 86 y.o. female with a history of a neuroendocrine tumor s/p right hemicolectomy, chronic diarrhea, and pulmonary emboli who is seen for reassessment.  HPI:  The patient was last seen in the oncology clinic on 05/31/20218.  At that time, she noted chronic diarrhea.  Chromogranin A and 24 hour urine for 5HIAA were negative.  She was lost to follow-up.  She was seen in the GI [facility] on 04/28/2019 by Effie Berkshire, PA.  She noted diarrhea daily.  She typically has 2 bowel movements before breakfast and multiple additional bowel movements a day (total 5-6 or more).  Stools are watery and recently looked like chili.  She denied any melena, hematochezia, hematuria or vaginal bleeding.  She has intermittent abdominal discomfort.  Weight had increased by 9-10 pounds over the past year.  Labs revealed a hematocrit of 22.8, hemoglobin 6.7, MCV 77.6, platelets 384,000, WBC 8700.  Creatinine 1.0.  Albumin 4.0.  Liver function tests were normal.  She was admitted to [facility] from 04/28/2019 - 04/30/2019.  She presented with progressive fatigue since Christmas.  She described being short of breath and not having enough energy to stand up and make a sandwich.  She described eating "what I can get my hands on".  She denied eating much iron rich foods.     Prior CBCs: 03/27/2015: Hematocrit 39.3. Hemoglobin 13.1. MCV 90.7. 07/23/2016: Hematocrit 37.7. Hemoglobin 12.9. MCV 90.9. 09/13/2018: Hematocrit 33.6. Hemoglobin 10.8. MCV 85.9. 04/28/2019: Hematocrit 23.4. Hemoglobin  6.7.  MCV 77.2.  04/29/2019: Hematocrit 28.5. Hemoglobin  9.0.  MCV 78.3. 04/30/2019: Hematocrit 28.8. Hemoglobin  8.9.  MCV 79.6  Ferritin was 4 with an iron saturation of 2% and a TIBC 462 c/w  severe iron deficiency anemia.  Retic was 1.1%.  B12 was 269 (low).  Additional normal labs included a folate (32), TSH (4.127), LDH (126), PT/INR (13.5/1.0) and PTT (31).  Urinalysis revealed no hematuria.  Stool is negative for C diff.  Chromogranin A was 64.8 ng/mL (0-191.8). 24 hour urine for 5HIAA was 1.1 mg/L (1.5 mg/24 hrs).  Urine culture was positive for E coli; she was treated with Septra and discharged to complete 3 additional days.  Xarelto was transiently held but restarted after no bleeding was discovered.  She received 1 unit of PRBCs on 04/28/2019 and 1 unit on 04/29/2019.  She received Feraheme on 04/29/2019.  Hemoglobin improved to 9.4 after 2 units of PRBCs.  Discharge CBC revealed a hematocrit of 28.8, hemoglobin 8.9, MCV 79.8, platelets 258,000, and WBC 8300.  Abdomen and pelvis CT on 04/30/2019 revealed post RIGHT hemicolectomy anatomy with no complicating features. There was no evidence of carcinoid tumor recurrence in the bowel, mesentery or liver. Infrarenal IVC filter was noted. She had diverticulosis of sigmoid colon without evidence acute diverticulitis.   She was discharged on ferrous sulfate 325 mg po TID and B12 1000 mcg po q day.  Symptomatically, she feels "lousy".  She describes diarrhea for 5-6 years with associated nausea. She is taking Imodium once a day. She denies any blood or mucus. Her stool is black secondary to being on oral iron. She is on oral B12 1,000 mcg daily. Her granddaughter notes that oral iron was affecting her stomach; the patient states that she can tolerate oral iron and  declined IV iron.   Patient reports lower back pain with associated burning and frequency with urination x 4 weeks. She denies any hematuria. She reports having a headache. She feels dizzy upon standing which she attributes to medication. She reports no improvement with physical therapy. She can perform all ALDs but feels exhausted afterwards. She has shortness of breath on minimal  exertion but no chest pains or palpitations.   Her granddaughter states her shortness of breath was better during her admission but after discharge her breathing has gotten worse. She has bone and joint pain secondary to fibromyalgia. Knees are sore. She notes it is hard to get going in the mornings. Her appetite is poor. She reports that she does not like to eat much. She likes to eat chocolate and sweets. She is a picky eater. She does not like vegetables. She drinks Ensure and Boost, but it causes diarrhea. Patient and granddaughter agreed to speak with a nutritionist.   Her PCP is Dr. Baldemar Lenis. Her granddaughter notes that Dr. Derinda Late took her off Xarelto until she was seen in the hematology clinic. Dr. Baldemar Lenis thought there was an underlying cause or blood loss that he could not identify.  I will contact Dr. Derinda Late and Effie Berkshire, PA about restarting her Xarelto. Patient is nervous about being off Xarelto and she does not want another blood clot.   Past Medical History:  Diagnosis Date  . A-fib ([place])   . Anemia   . Anxiety   . Arthritis    neck, back, knees  . Breast cancer ([place]) 03/11/2014   Overview:  S/p right mastectomy.  Reoccurrence s/p left matstectomy.  . Cancer ([place])    breast  . Closed fracture of neck of left femur with routine healing 08/07/2014  . Depression   . Dyspnea    easily  . Fibromyalgia   . GERD (gastroesophageal reflux disease)   . Headache    everyday  . Hip fracture requiring operative repair ([place]) 07/04/2014  . History of neck problems    shots in neck every three months  . Hypertension   . Hyperthyroidism   . IBS (irritable bowel syndrome)   . Neuropathy   . Osteoporosis   . Pulmonary embolism ([place])    H/O  . Stroke [facility])    in past no residual effects  . Thyroid disease   . Tremor of both hands   . Vertigo    worse in past    Past Surgical History:  Procedure Laterality Date  . ABDOMINAL HYSTERECTOMY    . CATARACT EXTRACTION     . CATARACT EXTRACTION W/PHACO Right 08/18/2017   Procedure: CATARACT EXTRACTION PHACO AND INTRAOCULAR LENS PLACEMENT ([place]) right;  Surgeon: Eulogio Bear, MD;  Location: [place];  Service: Ophthalmology;  Laterality: Right;  . CHOLECYSTECTOMY    . COLON SURGERY     resection  . HIP PINNING,CANNULATED Left 07/03/2014   Procedure: CANNULATED HIP PINNING;  Surgeon: Claud Kelp, MD;  Location: [facility];  Service: Orthopedics;  Laterality: Left;  Marland Kitchen MASTECTOMY Bilateral   . VENA CAVA FILTER PLACEMENT      Family History  Problem Relation Age of Onset  . Cancer Father        Bone  . Kidney cancer Neg Hx   . Prostate cancer Neg Hx   . Bladder Cancer Neg Hx     Social History:  reports that she has never smoked. She has never used smokeless tobacco. She  reports that she does not drink alcohol or use drugs. Two sons died (one died at the age of 59 with AIDS another son died at age 78 of melanoma). She lives at the [address] apartment complex in [place].  She lives alone.  Her friend Joycelyn Schmid lives upstairs. The patient is accompained by her granddaughter Lauren Mccann today via [place].  Allergies:  Allergies  Allergen Reactions  . Codeine Shortness Of Breath and Other (See Comments)    GI Upset  Pt states "makes heart flutter and trouble breathing" Other reaction(s): Other (See Comments) Chest Tightness, SOB, Tachycardia GI Upset  Pt states "makes heart flutter and trouble breathing" Other reaction(s): Other (See Comments) Chest Tightness, SOB, Tachycardia GI Upset  Pt states "makes heart flutter and trouble breathing"  . Duloxetine Shortness Of Breath  . Propoxyphene Shortness Of Breath    Other reaction(s): Other (See Comments) GI Upset  . Alendronate Sodium     Other reaction(s): Other (See Comments) GI Upset  . Atorvastatin     Other reaction(s): Other (See Comments) Myalgia  . Benadryl [Diphenhydramine Hcl] Other (See Comments)    Restless legs  . Cefuroxime  Axetil Nausea Only  . Cymbalta [Duloxetine Hcl]   . Diphenhydramine   . Lipitor [Atorvastatin Calcium]   . Other     Other reaction(s): Other (See Comments) GI Upset  . Prednisone     Other reaction(s): Other (See Comments) Chest Pain, SOB, "can't breathe good" Unable to sleep  . Levofloxacin Rash and Other (See Comments)    Altered mental status    Current Medications: Current Outpatient Medications  Medication Sig Dispense Refill  . acetaminophen (TYLENOL) 325 MG tablet Take 650 mg by mouth every 4 (four) hours as needed for mild pain or fever.     . ALPRAZolam (XANAX) 1 MG tablet Take 0.5 mg by mouth 2 (two) times daily as needed for anxiety.     . ferrous sulfate 325 (65 FE) MG tablet Take 1 tablet (325 mg total) by mouth in the morning, at noon, and at bedtime. 90 tablet 3  . levothyroxine (SYNTHROID, LEVOTHROID) 25 MCG tablet Take 25 mcg by mouth daily.     Marland Kitchen loperamide (IMODIUM) 2 MG capsule Take 2-4 mg by mouth 2 (two) times daily as needed for diarrhea or loose stools.    . metoprolol succinate (TOPROL-XL) 50 MG 24 hr tablet Take 50 mg by mouth daily.    . nortriptyline (PAMELOR) 50 MG capsule Take by mouth.    . sertraline (ZOLOFT) 100 MG tablet Take 150 mg by mouth daily.     . vitamin B-12 1000 MCG tablet Take 1 tablet (1,000 mcg total) by mouth daily. 90 tablet 0  . pantoprazole (PROTONIX) 20 MG tablet Take 20 mg by mouth daily.    . rivaroxaban (XARELTO) 20 MG TABS tablet Take 1 tablet (20 mg total) by mouth daily. (Patient not taking: Reported on 05/20/2019) 30 tablet 1   No current facility-administered medications for this visit.    Review of Systems  Constitutional: Negative for chills, diaphoresis, fever, malaise/fatigue and weight loss.       Feels "lousy". Performs ADLs without assistance. Using rolling walking.  HENT: Negative for congestion, ear discharge, ear pain, hearing loss, nosebleeds, sinus pain and sore throat.   Eyes: Negative for blurred vision.    Respiratory: Positive for shortness of breath (minimal exertional). Negative for cough, hemoptysis and sputum production.   Cardiovascular: Negative for chest pain, palpitations, orthopnea and leg swelling.  Gastrointestinal: Positive for diarrhea (chronic; on imodium; after drinking ensure and boost), melena (on oral iron) and nausea (occsaional). Negative for abdominal pain, blood in stool, constipation, heartburn and vomiting (occasional).       Poor appetite. Drinking ensure and boost. Picky eater.  Genitourinary: Positive for dysuria (burning; x 4 weeks) and frequency (x 4 weeks). Negative for hematuria and urgency.  Musculoskeletal: Positive for back pain (lower back) and joint pain (bilateral knees are sore). Negative for myalgias and neck pain.       Fibromyalgia.  Skin: Negative for itching and rash.  Neurological: Positive for dizziness (upon standing) and headaches. Negative for tingling, tremors, sensory change and weakness.  Endo/Heme/Allergies: Does not bruise/bleed easily.  Psychiatric/Behavioral: Negative for depression and memory loss. The patient is nervous/anxious (about Xarelto). The patient does not have insomnia.   All other systems reviewed and are negative.  Performance status (ECOG): 2  Vitals Blood pressure 108/65, pulse 82, temperature (!) 97.1 F (36.2 C), temperature source Tympanic, resp. rate 18, height 5\' 2"  (1.575 m), weight 150 lb (68 kg), SpO2 99 %.   Physical Exam Vitals and nursing note reviewed.  Constitutional:      General: She is not in acute distress.    Appearance: She is well-developed and well-nourished. She is not diaphoretic.     Comments: Ambulating with rolling walker. Needs assistance on/off exam room table.  HENT:     Head: Normocephalic and atraumatic.     Mouth/Throat:     Mouth: Oropharynx is clear and moist.     Pharynx: No oropharyngeal exudate.      Comments: Short blonde hair. Mask. Eyes:     General: No scleral icterus.     Extraocular Movements: EOM normal.     Conjunctiva/sclera: Conjunctivae normal.     Pupils: Pupils are equal, round, and reactive to light.  Cardiovascular:     Rate and Rhythm: Normal rate and regular rhythm.     Heart sounds: Normal heart sounds. No murmur heard.   Pulmonary:     Effort: Pulmonary effort is normal. No respiratory distress.     Breath sounds: Normal breath sounds. No wheezing or rales.  Chest:     Chest wall: No tenderness.  Abdominal:     General: Bowel sounds are normal. There is no distension.     Palpations: Abdomen is soft. There is no mass.     Tenderness: There is abdominal tenderness (mild). There is no guarding or rebound.  Musculoskeletal:        General: Tenderness (whole body; fibromyalgia) present. No edema. Normal range of motion.     Cervical back: Normal range of motion and neck supple.  Lymphadenopathy:     Head:     Right side of head: No preauricular, posterior auricular or occipital adenopathy.     Left side of head: No preauricular, posterior auricular or occipital adenopathy.     Cervical: No cervical adenopathy.     Upper Body:  No axillary adenopathy present.    Right upper body: No supraclavicular adenopathy.     Left upper body: No supraclavicular adenopathy.     Lower Body: No right inguinal adenopathy. No left inguinal adenopathy.  Skin:    General: Skin is warm and dry.  Neurological:     Mental Status: She is alert and oriented to person, place, and time.  Psychiatric:        Mood and Affect: Mood and affect normal.  Behavior: Behavior normal.        Thought Content: Thought content normal.        Judgment: Judgment normal.    No visits with results within 3 Day(s) from this visit.  Latest known visit with results is:  Admission on 04/28/2019, Discharged on 04/30/2019  Component Date Value Ref Range Status  . Sodium 04/28/2019 137  135 - 145 mmol/L Final  . Potassium 04/28/2019 3.4* 3.5 - 5.1 mmol/L Final  . Chloride  04/28/2019 103  98 - 111 mmol/L Final  . CO2 04/28/2019 23  22 - 32 mmol/L Final  . Glucose, Bld 04/28/2019 108* 70 - 99 mg/dL Final   Glucose reference range applies only to samples taken after fasting for at least 8 hours.  . BUN 04/28/2019 16  8 - 23 mg/dL Final  . Creatinine, Ser 04/28/2019 1.00  0.44 - 1.00 mg/dL Final  . Calcium 04/28/2019 9.2  8.9 - 10.3 mg/dL Final  . GFR calc non Af Amer 04/28/2019 51* >60 mL/min Final  . GFR calc Af Amer 04/28/2019 59* >60 mL/min Final  . Anion gap 04/28/2019 11  5 - 15 Final   Performed at [facility], [address] [place], [place] [address]  . WBC 04/28/2019 8.7  4.0 - 10.5 K/uL Final  . RBC 04/28/2019 3.03* 3.87 - 5.11 MIL/uL Final  . Hemoglobin 04/28/2019 6.7* 12.0 - 15.0 g/dL Final   Comment: Reticulocyte Hemoglobin testing may be clinically indicated, consider ordering this additional test [id number]   . HCT 04/28/2019 23.4* 36.0 - 46.0 % Final  . MCV 04/28/2019 77.2* 80.0 - 100.0 fL Final  . MCH 04/28/2019 22.1* 26.0 - 34.0 pg Final  . MCHC 04/28/2019 28.6* 30.0 - 36.0 g/dL Final  . RDW 04/28/2019 15.6* 11.5 - 15.5 % Final  . Platelets 04/28/2019 389  150 - 400 K/uL Final  . nRBC 04/28/2019 0.0  0.0 - 0.2 % Final   Performed at [facility], [address] [place], [place] [address]  . Color, Urine 04/28/2019 YELLOW* YELLOW Final  . APPearance 04/28/2019 HAZY* CLEAR Final  . Specific Gravity, Urine 04/28/2019 1.021  1.005 - 1.030 Final  . pH 04/28/2019 5.0  5.0 - 8.0 Final  . Glucose, UA 04/28/2019 NEGATIVE  NEGATIVE mg/dL Final  . Hgb urine dipstick 04/28/2019 NEGATIVE  NEGATIVE Final  . Bilirubin Urine 04/28/2019 NEGATIVE  NEGATIVE Final  . Ketones, ur 04/28/2019 NEGATIVE  NEGATIVE mg/dL Final  . Protein, ur 04/28/2019 NEGATIVE  NEGATIVE mg/dL Final  . Nitrite 04/28/2019 POSITIVE* NEGATIVE Final  . Chalmers Guest 04/28/2019 NEGATIVE  NEGATIVE Final  . RBC / HPF 04/28/2019 0-5  0 - 5 RBC/hpf Final  . WBC,  UA 04/28/2019 0-5  0 - 5 WBC/hpf Final  . Bacteria, UA 04/28/2019 MANY* NONE SEEN Final  . Squamous Epithelial / LPF 04/28/2019 0-5  0 - 5 Final  . Mucus 04/28/2019 PRESENT   Final  . Hyaline Casts, UA 04/28/2019 PRESENT   Final  . Ca Oxalate Crys, UA 04/28/2019 PRESENT   Final   Performed at [facility], [address], [place], [place] [address]  . ABO/RH(D) 04/28/2019 A POS   Final  . Antibody Screen 04/28/2019 NEG   Final  . Sample Expiration 04/28/2019 05/01/2019,2359   Final  . Unit Number 04/28/2019 [id number]   Final  . Blood Component Type 04/28/2019 RBC, LR IRR   Final  . Unit division 04/28/2019 00   Final  . Status of  Unit 04/28/2019 ISSUED,FINAL   Final  . Transfusion Status 04/28/2019 OK TO TRANSFUSE   Final  . Crossmatch Result 04/28/2019 Compatible   Final  . Unit Number 04/28/2019 [id number]   Final  . Blood Component Type 04/28/2019 RED CELLS,LR   Final  . Unit division 04/28/2019 00   Final  . Status of Unit 04/28/2019 ISSUED,FINAL   Final  . Transfusion Status 04/28/2019 OK TO TRANSFUSE   Final  . Crossmatch Result 04/28/2019    Final                   Value:Compatible Performed at [facility], [address] [place], [place] [address]   . SARS Coronavirus 2 04/28/2019 NEGATIVE  NEGATIVE Final   Comment: (NOTE) SARS-CoV-2 target nucleic acids are NOT DETECTED. The SARS-CoV-2 RNA is generally detectable in upper and lower respiratory specimens during the acute phase of infection. Negative results do not preclude SARS-CoV-2 infection, do not rule out co-infections with other pathogens, and should not be used as the sole basis for treatment or other patient management decisions. Negative results must be combined with clinical observations, patient history, and epidemiological information. The expected result is Negative. Fact Sheet for Patients: SugarRoll.be Fact Sheet for Healthcare  Providers: https://www.woods-mathews.com/ This test is not yet approved or cleared by the [place] FDA and  has been authorized for detection and/or diagnosis of SARS-CoV-2 by FDA under an Emergency Use Authorization (EUA). This EUA will remain  in effect (meaning this test can be used) for the duration of the COVID-19 declaration under Section 56                          4(b)(1) of the Act, 21 U.S.C. section 360bbb-3(b)(1), unless the authorization is terminated or revoked sooner. Performed at [facility], [place] [address] [place], [place] [address]   . Vitamin B-12 04/28/2019 269  180 - 914 pg/mL Final   Comment: (NOTE) This assay is not validated for testing neonatal or myeloproliferative syndrome specimens for Vitamin B12 levels. Performed at [facility], [place] [address] [place], [place] [address]   . Folate 04/28/2019 32.0  >5.9 ng/mL Final   Performed at [facility], [address] [place], [place] [address]  . Iron 04/28/2019 9* 28 - 170 ug/dL Final  . TIBC 04/28/2019 462* 250 - 450 ug/dL Final  . Saturation Ratios 04/28/2019 2* 10.4 - 31.8 % Final  . UIBC 04/28/2019 453  ug/dL Final   Performed at [facility], [address] [place], [place] [address]  . Ferritin 04/28/2019 4* 11 - 307 ng/mL Final   Performed at [facility], [address] [place], [place] [address]  . Retic Ct Pct 04/28/2019 1.1  0.4 - 3.1 % Final  . RBC. 04/28/2019 2.89* 3.87 - 5.11 MIL/uL Final  . Retic Count, Absolute 04/28/2019 32.9  19.0 - 186.0 K/uL Final  . Immature Retic Fract 04/28/2019 16.4* 2.3 - 15.9 % Final   Performed at [facility], [address] [place],  [address]  . Order Confirmation 04/28/2019    Final                   Value:ORDER PROCESSED BY BLOOD BANK Performed at [facility], [address] [place],  [address]   . ABO/RH(D) 04/28/2019    Final  Value:A  POS Performed at [facility], [address], [place], [place] [address]   . LDH 04/28/2019 126  98 - 192 U/L Final   Performed at [facility], [address] [place], [place] [address]  . C Diff antigen 04/29/2019 NEGATIVE  NEGATIVE Final  . C Diff toxin 04/29/2019 NEGATIVE  NEGATIVE Final  . C Diff interpretation 04/29/2019 No C. difficile detected.   Final   Performed at [facility], [address], [place], [place] [address]  . Plesiomonas shigelloides 04/29/2019 NOT DETECTED  NOT DETECTED Final  . Yersinia enterocolitica 04/29/2019 NOT DETECTED  NOT DETECTED Final  . Vibrio 04/29/2019 NOT DETECTED  NOT DETECTED Final  . Enteropathogenic E coli 04/29/2019 NOT DETECTED  NOT DETECTED Final  . E coli (ETEC) LT/ST 04/29/2019 NOT DETECTED  NOT DETECTED Final  . E coli 0157 by PCR [id number] Not applicable  NOT DETECTED Final  . Cryptosporidium by PCR 04/29/2019 NOT DETECTED  NOT DETECTED Final  . Entamoeba histolytica 04/29/2019 NOT DETECTED  NOT DETECTED Final  . Adenovirus F 40/41 04/29/2019 NOT DETECTED  NOT DETECTED Final  . Norovirus GI/GII 04/29/2019 NOT DETECTED  NOT DETECTED Final  . Sapovirus 04/29/2019 NOT DETECTED  NOT DETECTED Final   Comment: (NOTE) Performed At: [facility] [place], [place] [id number] Rush Farmer MD [id number]   . Vibrio cholerae 04/29/2019 NOT DETECTED  NOT DETECTED Final  . Campylobacter by PCR 04/29/2019 NOT DETECTED  NOT DETECTED Final  . Salmonella by PCR 04/29/2019 NOT DETECTED  NOT DETECTED Final  . E coli (STEC) 04/29/2019 NOT DETECTED  NOT DETECTED Final  . Enteroaggregative E coli 04/29/2019 NOT DETECTED  NOT DETECTED Final  . Shigella by PCR 04/29/2019 NOT DETECTED  NOT DETECTED Final  . Cyclospora cayetanensis 04/29/2019 NOT DETECTED  NOT DETECTED Final  . Astrovirus 04/29/2019 NOT DETECTED  NOT DETECTED Final  . G lamblia by PCR 04/29/2019 NOT DETECTED  NOT DETECTED Final  .  Rotavirus A by PCR 04/29/2019 NOT DETECTED  NOT DETECTED Final  . Prothrombin Time 04/28/2019 13.5  11.4 - 15.2 seconds Final  . INR 04/28/2019 1.0  0.8 - 1.2 Final   Comment: (NOTE) INR goal varies based on device and disease states. Performed at [facility], [address], [place], [place] [address]   . aPTT 04/28/2019 31  24 - 36 seconds Final   Performed at [facility], [address] [place], [place] [address]  . Sodium 04/29/2019 139  135 - 145 mmol/L Final  . Potassium 04/29/2019 4.3  3.5 - 5.1 mmol/L Final  . Chloride 04/29/2019 107  98 - 111 mmol/L Final  . CO2 04/29/2019 23  22 - 32 mmol/L Final  . Glucose, Bld 04/29/2019 86  70 - 99 mg/dL Final   Glucose reference range applies only to samples taken after fasting for at least 8 hours.  . BUN 04/29/2019 13  8 - 23 mg/dL Final  . Creatinine, Ser 04/29/2019 0.95  0.44 - 1.00 mg/dL Final  . Calcium 04/29/2019 8.7* 8.9 - 10.3 mg/dL Final  . GFR calc non Af Amer 04/29/2019 54* >60 mL/min Final  . GFR calc Af Amer 04/29/2019 >60  >60 mL/min Final  . Anion gap 04/29/2019 9  5 - 15 Final   Performed at [facility], [address] [place], [place] [address]  . WBC 04/29/2019 7.2  4.0 - 10.5 K/uL Final  . RBC 04/29/2019 3.64* 3.87 -  5.11 MIL/uL Final  . Hemoglobin 04/29/2019 9.0* 12.0 - 15.0 g/dL Final  . HCT 04/29/2019 28.5* 36.0 - 46.0 % Final  . MCV 04/29/2019 78.3* 80.0 - 100.0 fL Final  . MCH 04/29/2019 24.7* 26.0 - 34.0 pg Final  . MCHC 04/29/2019 31.6  30.0 - 36.0 g/dL Final  . RDW 04/29/2019 15.6* 11.5 - 15.5 % Final  . Platelets 04/29/2019 299  150 - 400 K/uL Final  . nRBC 04/29/2019 0.0  0.0 - 0.2 % Final   Performed at [facility], [address] [place], [place] [address]  . Hemoglobin 04/28/2019 7.8* 12.0 - 15.0 g/dL Final  . HCT 04/28/2019 25.3* 36.0 - 46.0 % Final   Performed at [facility], [address] [place], [place] [address]  . Hemoglobin  04/29/2019 7.4* 12.0 - 15.0 g/dL Final  . HCT 04/29/2019 23.8* 36.0 - 46.0 % Final   Performed at [facility], [address] [place], [place] [address]  . Hemoglobin 04/29/2019 9.4* 12.0 - 15.0 g/dL Final  . HCT 04/29/2019 29.2* 36.0 - 46.0 % Final   Performed at [facility], [address] [place], [place] [address]  . ISSUE DATE / TIME 04/28/2019 [id number]   Final  . Blood Product Unit Number 04/28/2019 [id number]   Final  . PRODUCT CODE 04/28/2019 [id number]   Final  . Unit Type and Rh 04/28/2019 0600   Final  . Blood Product Expiration Date 04/28/2019 [id number]   Final  . ISSUE DATE / TIME 04/28/2019 [id number]   Final  . Blood Product Unit Number 04/28/2019 [id number]   Final  . PRODUCT CODE 04/28/2019 [id number]   Final  . Unit Type and Rh 04/28/2019 6200   Final  . Blood Product Expiration Date 04/28/2019 [id number]   Final  . TSH 04/28/2019 4.127  0.350 - 4.500 uIU/mL Final   Comment: Performed by a 3rd Generation assay with a functional sensitivity of <=0.01 uIU/mL. Performed at [facility], [address], [place], [place] [address]   . Magnesium 04/28/2019 2.0  1.7 - 2.4 mg/dL Final   Performed at [facility], [address] [place], [place] [address]  . Specimen Description 04/28/2019    Final                   Value:URINE, CATHETERIZED Performed at [facility] [address] [place], [place] [address]   . Special Requests 04/28/2019    Final                   Value:NONE Performed at [facility], [address] [place], [place] [address]   . Culture 04/28/2019 >=100,000 COLONIES/mL ESCHERICHIA COLI*  Final  . Report Status 04/28/2019 05/01/2019 FINAL   Final  . Organism ID, Bacteria 04/28/2019 ESCHERICHIA COLI*  Final  . Order Confirmation 04/29/2019    Final                   Value:ORDER PROCESSED BY BLOOD BANK Performed at [facility], [address] [place], [place]  [address]   . 5-HIAA, Ur 04/29/2019 1.1  Undefined mg/L Final   Comment: (NOTE) This test was developed and its performance characteristics determined by [facility]. It has not been cleared or approved by the Food and Drug Administration.   Marland Kitchen 5-HIAA,Quant.,24 Hr Urine 04/29/2019 1.5  0.0 - 14.9 mg/24 hr Final   Comment: (NOTE) Performed At: [facility], [place]  [id number] Rush Farmer MD [id number]   . Total Volume 04/29/2019 TV 1350ML   Final   Performed at [facility], [address] [place], [place] [address]  . Chromogranin A (ng/mL) 04/30/2019 64.8  0.0 - 101.8 ng/mL Final   Comment: (NOTE) Results of this test are labeled for research purposes only by the assay's manufacturer. The performance characteristics of this assay have not been established by the manufacturer. The result should not be used for treatment or for diagnostic purposes without confirmation of the diagnosis by another medically established diagnostic product or procedure. The performance characteristics were determined by [facility]. Chromogranin A performed by Thermofisher/BRAHMS KRYPTOR methodology Values obtained with different assay methods or kits cannot be used interchangeably. Performed At: [facility] [place], [place] [id number] Rush Farmer MD [id number]   . WBC 04/30/2019 8.3  4.0 - 10.5 K/uL Final  . RBC 04/30/2019 3.62* 3.87 - 5.11 MIL/uL Final  . Hemoglobin 04/30/2019 8.9* 12.0 - 15.0 g/dL Final  . HCT 04/30/2019 28.8* 36.0 - 46.0 % Final  . MCV 04/30/2019 79.6* 80.0 - 100.0 fL Final  . MCH 04/30/2019 24.6* 26.0 - 34.0 pg Final  . MCHC 04/30/2019 30.9  30.0 - 36.0 g/dL Final  . RDW 04/30/2019 16.4* 11.5 - 15.5 % Final  . Platelets 04/30/2019 258  150 - 400 K/uL Final  . nRBC 04/30/2019 0.0  0.0 - 0.2 % Final   Performed at [facility], [address], [place], [place] [address]  . Sodium 04/30/2019 139  135 -  145 mmol/L Final  . Potassium 04/30/2019 4.7  3.5 - 5.1 mmol/L Final  . Chloride 04/30/2019 110  98 - 111 mmol/L Final  . CO2 04/30/2019 22  22 - 32 mmol/L Final  . Glucose, Bld 04/30/2019 90  70 - 99 mg/dL Final   Glucose reference range applies only to samples taken after fasting for at least 8 hours.  . BUN 04/30/2019 13  8 - 23 mg/dL Final  . Creatinine, Ser 04/30/2019 1.08* 0.44 - 1.00 mg/dL Final  . Calcium 04/30/2019 8.7* 8.9 - 10.3 mg/dL Final  . GFR calc non Af Amer 04/30/2019 46* >60 mL/min Final  . GFR calc Af Amer 04/30/2019 54* >60 mL/min Final  . Anion gap 04/30/2019 7  5 - 15 Final   Performed at [facility], [address] [place], [place] [address]    Assessment:  SELEST YOX is a 86 y.o. female with a neuroendocrine tumor s/p right hemicolectomy on 10/29/2012.  Pathology revealed a 2.0 cm grade I neuroendocrine tumor arising in the distal ileum.  Tumor extended through the muscularis propria into the adjacent adipose tissue.  Margins were negative.  There was lymph-vascular invasion.  Five of 14 lymph nodes were positive.  Pathologic stage was T3N1.  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  She received Sandostatin LAR 30 mg every 4 weeks for about 2 years.  Abdomen and pelvis CT on 04/30/2019 revealed post RIGHT hemicolectomy anatomy with no complicating features. There was no evidence of carcinoid tumor recurrence in the bowel, mesentery or liver. Infrarenal IVC filter was noted. She had diverticulosis of sigmoid colon without evidence acute diverticulitis.  Chromogranin A was 64.8 ng/mL (0-191.8) on 04/30/2019. 24 hour urine for 5HIAA was normal.  She has iron deficiency anemia.  Microcytic anemia has developed since 09/2018.   Labs on 04/28/2019  included a ferritin of 4 with an iron saturation of 2% and a TIBC 462 c/w severe iron deficiency anemia.   Retic was 1.1%.  B12 was 269 (low).  Normal labs included a folate, TSH, LDH, PT/INR and PTT.  Urinalysis revealed no hematuria.  Diet is poor.  She denies any melena, hematochezia, hematuria or vaginal bleeding.  Stool was guaiac negative.  She received 1 unit of PRBCs on 04/28/2019 and 1 unit on 04/29/2019.  She received Feraheme on 04/29/2019.  Ferritin has been followed: 4 on 04/28/2019 and 291 on 05/20/2019.  She was diagnosed with extensive bilateral pulmonary emboli on 11/06/2011.  There was a saddle embolus.  There was a large pulmonary emboli within the right main pulmonary artery extending into the segmental branches of the right upper, right lower and right middle lobe.  There were pulmonary emboli in the left upper lobe and peripheral branches of the left lower lobe artery segements.  She was treated with Lovenox and discharged on Xarelto.  Lower extremity duplex revealed occlusive thrombus in the right popliteal vein and near occlusive thrombus in the proximal superficial femoral vein.  An IVC filter was placed.  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  She remains on Xarelto.  She has a history of bilateral breast cancer.  She right breast cancer 20+ years ago. She underwent lumpectomy.  Left breast cancer was diagnosed 7-8 years ago. She never received radiation or chemotherapy. No details are available.  Symptomatically, she feels "lousy" secondary to chronic diarrhea for 5-6 years with associated nausea.  She has shortness of breath on minimal exertion. She has diffuse pain secondary to fibromyalgia. She is concerned about being off Xarelto.  Plan: 1.   Labs today:  CBC with diff, retic, ferritin, iron studies, anti-parietal antibody, intrinsic factor antibodies. 2.   Iron deficiency anemia             Hematocrit 35.9.  Hemoglobin 110.0.  MCV 85.3.  Ferritin was 4 on 04/28/2019.  Ferritin 291 with an iron saturation of 15% and a TIBC 300.  She  denies any bleeding.             She received Feraheme on 04/29/2019.   Preauth Feraheme if needed in the future.  She is on oral iron. 3.   B12 deficiency             B12 was 269 on 04/28/2019.  B12 goal 400.  She began oral B12 10000 mcg a day on 04/29/2019.  Etiology secondary to poor diet and possibly malabsorption.  Recheck B12 in 1 month.  If no improvement, will begin B12 injections. 4.   Diarrhea             She has had chronic diarrhea since right hemicolectomy.             Stool was negative for C diff.             Chromogranin A was 64.8 ng/mL (0-191.8).   24 hour urine for 5HIAA was 1.1 mg/L (1.5 mg/24 hrs).    Follow-up with GI. 5.   History of pulmonary emboli and DVT  Discuss plan to restart Xarelto.  MD to call Dr Loney Hering and Tammi Klippel, PA.  6.   Weight loss  Diet is poor.  Nutrition referral. 7.   RTC in 1 month for MD assessment, labs (CBC with diff, ferritin, iron stores- day before), and +/- Feraheme.  I discussed  the assessment and treatment plan with the patient.  The patient was provided an opportunity to ask questions and all were answered.  The patient agreed with the plan and demonstrated an understanding of the instructions.  The patient was advised to call back if the symptoms worsen or if the condition fails to improve as anticipated.  I provided 29 minutes (3:00 PM - 3:00 PM) of face-to-face time during this encounter and > 50% was spent counseling as documented under my assessment and plan. An additional 10-15 minutes were spent reviewing her chart (Epic and Care Everywhere) including notes, labs, and imaging studies.    Cyra Spader C. Mike Gip, MD, PhD    05/20/2019, 3:00 PM  I, Selena Batten, am acting as scribe for [employer]. Mike Gip, MD, PhD.  I, Margarito Dehaas C. Mike Gip, MD, have reviewed the above documentation for accuracy and completeness, and I agree with the above.

## 2019-05-20 ENCOUNTER — Inpatient Hospital Stay: Payer: Medicare Other

## 2019-05-20 ENCOUNTER — Encounter: Payer: Self-pay | Admitting: Hematology and Oncology

## 2019-05-20 ENCOUNTER — Inpatient Hospital Stay: Payer: Medicare Other | Attending: Hematology and Oncology | Admitting: Hematology and Oncology

## 2019-05-20 ENCOUNTER — Other Ambulatory Visit: Payer: Self-pay

## 2019-05-20 ENCOUNTER — Other Ambulatory Visit: Payer: Self-pay | Admitting: Hematology and Oncology

## 2019-05-20 VITALS — BP 108/65 | HR 82 | Temp 97.1°F | Resp 18 | Ht 62.0 in | Wt 150.0 lb

## 2019-05-20 DIAGNOSIS — Z86711 Personal history of pulmonary embolism: Secondary | ICD-10-CM

## 2019-05-20 DIAGNOSIS — E538 Deficiency of other specified B group vitamins: Secondary | ICD-10-CM

## 2019-05-20 DIAGNOSIS — C7A012 Malignant carcinoid tumor of the ileum: Secondary | ICD-10-CM | POA: Diagnosis not present

## 2019-05-20 DIAGNOSIS — D509 Iron deficiency anemia, unspecified: Secondary | ICD-10-CM

## 2019-05-20 DIAGNOSIS — R634 Abnormal weight loss: Secondary | ICD-10-CM

## 2019-05-20 LAB — IRON AND TIBC
Iron: 44 ug/dL (ref 28–170)
Saturation Ratios: 15 % (ref 10.4–31.8)
TIBC: 300 ug/dL (ref 250–450)
UIBC: 256 ug/dL

## 2019-05-20 LAB — RETICULOCYTES
Immature Retic Fract: 6.4 % (ref 2.3–15.9)
RBC.: 4.14 MIL/uL (ref 3.87–5.11)
Retic Count, Absolute: 40.2 10*3/uL (ref 19.0–186.0)
Retic Ct Pct: 1 % (ref 0.4–3.1)

## 2019-05-20 LAB — CBC WITH DIFFERENTIAL/PLATELET
Abs Immature Granulocytes: 0.05 10*3/uL (ref 0.00–0.07)
Basophils Absolute: 0.1 10*3/uL (ref 0.0–0.1)
Basophils Relative: 1 %
Eosinophils Absolute: 0.3 10*3/uL (ref 0.0–0.5)
Eosinophils Relative: 3 %
HCT: 35.9 % — ABNORMAL LOW (ref 36.0–46.0)
Hemoglobin: 11 g/dL — ABNORMAL LOW (ref 12.0–15.0)
Immature Granulocytes: 1 %
Lymphocytes Relative: 30 %
Lymphs Abs: 3.1 10*3/uL (ref 0.7–4.0)
MCH: 26.1 pg (ref 26.0–34.0)
MCHC: 30.6 g/dL (ref 30.0–36.0)
MCV: 85.3 fL (ref 80.0–100.0)
Monocytes Absolute: 0.9 10*3/uL (ref 0.1–1.0)
Monocytes Relative: 9 %
Neutro Abs: 5.8 10*3/uL (ref 1.7–7.7)
Neutrophils Relative %: 56 %
Platelets: 238 10*3/uL (ref 150–400)
RBC: 4.21 MIL/uL (ref 3.87–5.11)
RDW: 23.5 % — ABNORMAL HIGH (ref 11.5–15.5)
WBC: 10.2 10*3/uL (ref 4.0–10.5)
nRBC: 0 % (ref 0.0–0.2)

## 2019-05-20 LAB — FERRITIN: Ferritin: 291 ng/mL (ref 11–307)

## 2019-05-20 NOTE — Progress Notes (Signed)
The patient c/o lower back pain with burning and frequency with urination x 4 weeks.

## 2019-05-21 ENCOUNTER — Telehealth: Payer: Self-pay

## 2019-05-21 LAB — ANTI-PARIETAL ANTIBODY: Parietal Cell Antibody-IgG: 1.6 Units (ref 0.0–20.0)

## 2019-05-21 LAB — INTRINSIC FACTOR ANTIBODIES: Intrinsic Factor: 1.1 AU/mL (ref 0.0–1.1)

## 2019-05-21 NOTE — Telephone Encounter (Signed)
-----   Message from Lequita Asal, MD sent at 05/21/2019  9:04 AM EDT ----- Regarding: Please call patient with lab results- iron studies improved.  ----- Message ----- From: Interface, Lab In [facility] Sent: 05/20/2019   3:46 PM EDT To: Lequita Asal, MD

## 2019-05-21 NOTE — Telephone Encounter (Signed)
Spoke with Dalbert Mayotte to inform her that Lauren Mccann has improved from 9 to 44. Lauren Dalbert Mayotte was understanding and agreeable.

## 2019-05-31 ENCOUNTER — Telehealth: Payer: Self-pay

## 2019-05-31 ENCOUNTER — Inpatient Hospital Stay: Payer: Medicare Other

## 2019-05-31 NOTE — Progress Notes (Signed)
Nutrition Assessment   Reason for Assessment: Referral for weight loss   ASSESSMENT:  86 year old female with history of neuroendocrine tumor s/p right hemicolectomy, chronic diarrhea, pulmonary emboli recently diagnosed with anemia.  Followed by Dr. Mike Gip.  Noted no evidenced of recurrence.    Spoke with patient via phone for nutrition assessment.  Patient reports that she had a poor appetite and is a picky eater.  Reports that she had a bacon sandwich for breakfast this am. Lunch she usually skips.  Dinner she usually has a sandwich or sometimes macaroni and cheese. Does not really like vegetables except green beans.  Loves chocolate.  Drinks protein shakes (slimfast) but reports that they make her diarrhea worse.  Has diarrhea following surgery. She has not noticed any particular food except for supplements to increase diarrhea.  Takes imodium 1-2 times per day. Reports at times does not eat for fear of having diarrhea  Reports that she is receiving PT following recent admission. Does not have much energy or strength.    Medications: fe sulfate, vit B 12, imodium, protonix   Labs: reviewed   Anthropometrics:   Height: 62 inches Weight: 150 lb 3/18 UBW: 146 lb 09/13/18 (weight has been increasing) BMI: 27   Estimated Energy Needs  Kcals: 1700-2000 Protein: 85-100 g Fluid: > 1.7 L   NUTRITION DIAGNOSIS: Inadequate oral intake related to altered GI function (diarrhea) as evidenced by poor appetite, fear of eating    INTERVENTION:  Recommend daily MVI Discussed foods high in protein and calories that she likes that she can add into diet (chicken salad, tuna salad, pimento cheese, yogurt, cottage cheese, peanut butter).  Patient does not like frozen meals or canned soups.  Patient provided with contact information   MONITORING, EVALUATION, GOAL: Patient will consume adequate calories and protein to meet nutritional needs   Next Visit: April 26th phone f/u  Kaleisha Bhargava B.  Zenia Resides, [place], [place] Registered Dietitian [phone] (pager)

## 2019-05-31 NOTE — Telephone Encounter (Addendum)
error 

## 2019-06-16 ENCOUNTER — Inpatient Hospital Stay: Payer: Medicare Other | Attending: Hematology and Oncology

## 2019-06-16 ENCOUNTER — Other Ambulatory Visit: Payer: Self-pay

## 2019-06-16 DIAGNOSIS — C7A8 Other malignant neuroendocrine tumors: Secondary | ICD-10-CM | POA: Insufficient documentation

## 2019-06-16 DIAGNOSIS — D509 Iron deficiency anemia, unspecified: Secondary | ICD-10-CM | POA: Insufficient documentation

## 2019-06-16 LAB — CBC WITH DIFFERENTIAL/PLATELET
Abs Immature Granulocytes: 0.03 10*3/uL (ref 0.00–0.07)
Basophils Absolute: 0 10*3/uL (ref 0.0–0.1)
Basophils Relative: 0 %
Eosinophils Absolute: 0.2 10*3/uL (ref 0.0–0.5)
Eosinophils Relative: 1 %
HCT: 34.3 % — ABNORMAL LOW (ref 36.0–46.0)
Hemoglobin: 11 g/dL — ABNORMAL LOW (ref 12.0–15.0)
Immature Granulocytes: 0 %
Lymphocytes Relative: 30 %
Lymphs Abs: 3.3 10*3/uL (ref 0.7–4.0)
MCH: 28.4 pg (ref 26.0–34.0)
MCHC: 32.1 g/dL (ref 30.0–36.0)
MCV: 88.4 fL (ref 80.0–100.0)
Monocytes Absolute: 1 10*3/uL (ref 0.1–1.0)
Monocytes Relative: 9 %
Neutro Abs: 6.3 10*3/uL (ref 1.7–7.7)
Neutrophils Relative %: 60 %
Platelets: 252 10*3/uL (ref 150–400)
RBC: 3.88 MIL/uL (ref 3.87–5.11)
RDW: 23.6 % — ABNORMAL HIGH (ref 11.5–15.5)
WBC: 10.7 10*3/uL — ABNORMAL HIGH (ref 4.0–10.5)
nRBC: 0 % (ref 0.0–0.2)

## 2019-06-16 LAB — IRON AND TIBC
Iron: 83 ug/dL (ref 28–170)
Saturation Ratios: 25 % (ref 10.4–31.8)
TIBC: 326 ug/dL (ref 250–450)
UIBC: 243 ug/dL

## 2019-06-16 LAB — FERRITIN: Ferritin: 108 ng/mL (ref 11–307)

## 2019-06-17 ENCOUNTER — Inpatient Hospital Stay: Payer: Medicare Other

## 2019-06-17 ENCOUNTER — Inpatient Hospital Stay: Payer: Medicare Other | Admitting: Hematology and Oncology
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
## 2019-06-21 ENCOUNTER — Encounter: Payer: Self-pay | Admitting: Hematology and Oncology

## 2019-06-21 ENCOUNTER — Inpatient Hospital Stay: Payer: Medicare Other | Attending: Hematology and Oncology | Admitting: Hematology and Oncology

## 2019-06-21 DIAGNOSIS — D508 Other iron deficiency anemias: Secondary | ICD-10-CM

## 2019-06-21 DIAGNOSIS — E538 Deficiency of other specified B group vitamins: Secondary | ICD-10-CM

## 2019-06-28 ENCOUNTER — Inpatient Hospital Stay: Payer: Medicare Other

## 2019-06-28 NOTE — Progress Notes (Signed)
Nutrition Follow-up:  Patient with history of neuroendocrine tube s/p right hemicolectomy, chronic diarrhea, pulmonary emboli recently diagnosed with anemia.  No recurrence.  Followed by Dr. Mike Gip.  Spoke with patient via phone.  Patient reports that she is not feeling very well today.  Reports that she is having pain from neck down to tailbone.  Also reports waking up this am at 2 with diarrhea (chronic).  Reports that she has tried to add more protein into her diet (chicken, chicken salad, cottage cheese, yogurt, pimento cheese) since she last talked with RD.      Medications: reviewed  Labs: reviewed  Anthropometrics:   No new weight.    Patient thinks her weight is stable   NUTRITION DIAGNOSIS:  Inadequate oral intake stable   INTERVENTION:  Encouraged patient to continue to eat good sources of protein and higher calorie foods.  Patient has contact information     MONITORING, EVALUATION, GOAL: patient will consume adequate calories and protein to meet nutritional needs and prevent weight loss   NEXT VISIT: June 21 phone f/u (Monday)  Trevian Hayashida B. Zenia Resides, [place], [place] Registered Dietitian [phone] (pager)

## 2019-08-16 DIAGNOSIS — Z86711 Personal history of pulmonary embolism: Secondary | ICD-10-CM | POA: Insufficient documentation

## 2019-08-20 ENCOUNTER — Inpatient Hospital Stay: Payer: Medicare Other | Attending: Hematology and Oncology

## 2019-08-20 ENCOUNTER — Other Ambulatory Visit: Payer: Self-pay

## 2019-08-20 DIAGNOSIS — D508 Other iron deficiency anemias: Secondary | ICD-10-CM | POA: Insufficient documentation

## 2019-08-20 DIAGNOSIS — E538 Deficiency of other specified B group vitamins: Secondary | ICD-10-CM

## 2019-08-20 LAB — CBC WITH DIFFERENTIAL/PLATELET
Abs Immature Granulocytes: 0.03 10*3/uL (ref 0.00–0.07)
Basophils Absolute: 0.1 10*3/uL (ref 0.0–0.1)
Basophils Relative: 1 %
Eosinophils Absolute: 0.2 10*3/uL (ref 0.0–0.5)
Eosinophils Relative: 2 %
HCT: 36.1 % (ref 36.0–46.0)
Hemoglobin: 12.1 g/dL (ref 12.0–15.0)
Immature Granulocytes: 0 %
Lymphocytes Relative: 36 %
Lymphs Abs: 3.4 10*3/uL (ref 0.7–4.0)
MCH: 31 pg (ref 26.0–34.0)
MCHC: 33.5 g/dL (ref 30.0–36.0)
MCV: 92.6 fL (ref 80.0–100.0)
Monocytes Absolute: 1.1 10*3/uL — ABNORMAL HIGH (ref 0.1–1.0)
Monocytes Relative: 12 %
Neutro Abs: 4.6 10*3/uL (ref 1.7–7.7)
Neutrophils Relative %: 49 %
Platelets: 207 10*3/uL (ref 150–400)
RBC: 3.9 MIL/uL (ref 3.87–5.11)
RDW: 13.7 % (ref 11.5–15.5)
WBC: 9.4 10*3/uL (ref 4.0–10.5)
nRBC: 0 % (ref 0.0–0.2)

## 2019-08-20 LAB — IRON AND TIBC
Iron: 41 ug/dL (ref 28–170)
Saturation Ratios: 13 % (ref 10.4–31.8)
TIBC: 312 ug/dL (ref 250–450)
UIBC: 271 ug/dL

## 2019-08-20 LAB — VITAMIN B12: Vitamin B-12: 1656 pg/mL — ABNORMAL HIGH (ref 180–914)

## 2019-08-23 ENCOUNTER — Inpatient Hospital Stay: Payer: Medicare Other

## 2019-08-23 NOTE — Progress Notes (Signed)
Nutrition Follow-up:  Patient with history of neuroendocrine tumor s/p right hemicolectomy, chronic diarrhea, pulmonary emboli recently diagnosed with anemia.  No recurrence. Followed by Dr. Mike Gip.   Spoke with patient via phone for nutrition follow-up.  Patient reports that she feels bad and that nothing is working for her.  States that she just hurts and just wants to die.  States that this Probation officer does not need to call anybody because she is not going to harm herself.  States that nobody cares about her or this country.  States that she worries about this country and the young people.  "Young people just don't care anymore."  This Probation officer asked patient if there is someone that she could talk too. Denman George, family member, pastor. Patient responded no.  Apologized for her this not being a good time to talk and then hung up.    Intervention: RD sent message to provider and Elease Etienne.   RD available if needed.  Takao Lizer B. Zenia Resides, [place], [place] Registered Dietitian [phone] (pager)

## 2019-10-20 ENCOUNTER — Inpatient Hospital Stay: Payer: Medicare Other | Attending: Hematology and Oncology

## 2019-10-20 ENCOUNTER — Other Ambulatory Visit: Payer: Medicare Other

## 2019-10-20 ENCOUNTER — Other Ambulatory Visit: Payer: Self-pay

## 2019-10-20 DIAGNOSIS — C7A8 Other malignant neuroendocrine tumors: Secondary | ICD-10-CM | POA: Insufficient documentation

## 2019-10-20 DIAGNOSIS — I10 Essential (primary) hypertension: Secondary | ICD-10-CM | POA: Diagnosis not present

## 2019-10-20 DIAGNOSIS — Z86711 Personal history of pulmonary embolism: Secondary | ICD-10-CM | POA: Insufficient documentation

## 2019-10-20 DIAGNOSIS — D508 Other iron deficiency anemias: Secondary | ICD-10-CM

## 2019-10-20 DIAGNOSIS — Z8673 Personal history of transient ischemic attack (TIA), and cerebral infarction without residual deficits: Secondary | ICD-10-CM | POA: Insufficient documentation

## 2019-10-20 DIAGNOSIS — I4891 Unspecified atrial fibrillation: Secondary | ICD-10-CM | POA: Insufficient documentation

## 2019-10-20 DIAGNOSIS — Z79899 Other long term (current) drug therapy: Secondary | ICD-10-CM | POA: Diagnosis not present

## 2019-10-20 DIAGNOSIS — Z7901 Long term (current) use of anticoagulants: Secondary | ICD-10-CM | POA: Insufficient documentation

## 2019-10-20 LAB — CBC WITH DIFFERENTIAL/PLATELET
Abs Immature Granulocytes: 0.02 10*3/uL (ref 0.00–0.07)
Basophils Absolute: 0 10*3/uL (ref 0.0–0.1)
Basophils Relative: 0 %
Eosinophils Absolute: 0.2 10*3/uL (ref 0.0–0.5)
Eosinophils Relative: 2 %
HCT: 36.7 % (ref 36.0–46.0)
Hemoglobin: 12.3 g/dL (ref 12.0–15.0)
Immature Granulocytes: 0 %
Lymphocytes Relative: 36 %
Lymphs Abs: 3.2 10*3/uL (ref 0.7–4.0)
MCH: 31 pg (ref 26.0–34.0)
MCHC: 33.5 g/dL (ref 30.0–36.0)
MCV: 92.4 fL (ref 80.0–100.0)
Monocytes Absolute: 1 10*3/uL (ref 0.1–1.0)
Monocytes Relative: 12 %
Neutro Abs: 4.5 10*3/uL (ref 1.7–7.7)
Neutrophils Relative %: 50 %
Platelets: 212 10*3/uL (ref 150–400)
RBC: 3.97 MIL/uL (ref 3.87–5.11)
RDW: 12.9 % (ref 11.5–15.5)
WBC: 9 10*3/uL (ref 4.0–10.5)
nRBC: 0 % (ref 0.0–0.2)

## 2019-10-20 LAB — FERRITIN: Ferritin: 70 ng/mL (ref 11–307)

## 2019-10-20 LAB — SEDIMENTATION RATE: Sed Rate: 54 mm/hr — ABNORMAL HIGH (ref 0–30)

## 2019-10-20 NOTE — Progress Notes (Signed)
[facility]  [address], [address] [place], [place] [address] Phone: [phone]  Fax: [phone]   Telephone Visit:  10/21/2019  Referring physician: Derinda Late, MD  I connected with [place] on 10/21/2019 at 1:50 PM by telephone conferencing and verified that I was speaking with the correct person using 2 identifiers.  The patient was at home.  I discussed the limitations, risk, security and privacy concerns of performing an evaluation and management service by telephone conferencing and the availability of in person appointments.  I also discussed with the patient that there may be a patient responsible charge related to this service.  The patient expressed understanding and agreed to proceed.   Chief Complaint: Lauren Mccann is a 86 y.o. female with a history of a neuroendocrine tumor s/p right hemicolectomy, chronic diarrhea, and pulmonary emboli who is seen for a 4 month assessment.  HPI: The patient was last seen in the medical oncology clinic on 06/21/2019 for a telemedicine visit. A that time, she felt a little bit better. Stool was dark on oral iron. She denied any bleeding. Hematocrit was 34.3, hemoglobin 11.0, platelets 252,000, WBC 10,700. Ferritin was 108 with an iron saturation of 25% and a TIBC 326.   She last spoke with Jennet Maduro, RD on 06/28/2019. She was feeling down. She had some neck and tailbone pain. She had ongoing diarrhea. She was trying to add more protein to her diet. She was encouraged to eat good sources of protein and higher calorie foods.   Labs followed: 08/20/2019: Hematocrit 36.1, hemoglobin 12.1, platelets 207,000, WBC 9,400. Iron saturation 13% with a TIBC of 312. Vitamin B12 was 1,656.  10/20/2019: Hematocrit 36.7, hemoglobin 12.3, platelets 212,000, WBC 9,000. Ferritin was 70. Sed rate was 54.   During the interim, she was not feeling to well. She is having issues with chronic diarrhea. She is taking  imodium BID. She denies being around anyone sick. She feels like this is effecting the quality of life. She had no followed up with her GI doctor. She is on oral iron. She denies constipation. She has melena.   She reports mild shortness of breath. She has fibromyalgia with associated back pain. She continues to have headaches and feels weak and dizzy. She continues to take oral B12.    Past Medical History:  Diagnosis Date  . A-fib ([place])   . Anemia   . Anxiety   . Arthritis    neck, back, knees  . Breast cancer ([place]) 03/11/2014   Overview:  S/p right mastectomy.  Reoccurrence s/p left matstectomy.  . Cancer ([place])    breast  . Closed fracture of neck of left femur with routine healing 08/07/2014  . Depression   . Dyspnea    easily  . Fibromyalgia   . GERD (gastroesophageal reflux disease)   . Headache    everyday  . Hip fracture requiring operative repair ([place]) 07/04/2014  . History of neck problems    shots in neck every three months  . Hypertension   . Hyperthyroidism   . IBS (irritable bowel syndrome)   . Neuropathy   . Osteoporosis   . Pulmonary embolism ([place])    H/O  . Stroke [facility])    in past no residual effects  . Thyroid disease   . Tremor of both hands   . Vertigo    worse in past    Past Surgical History:  Procedure Laterality Date  . ABDOMINAL HYSTERECTOMY    .  CATARACT EXTRACTION    . CATARACT EXTRACTION W/PHACO Right 08/18/2017   Procedure: CATARACT EXTRACTION PHACO AND INTRAOCULAR LENS PLACEMENT ([place]) right;  Surgeon: Eulogio Bear, MD;  Location: [facility];  Service: Ophthalmology;  Laterality: Right;  . CHOLECYSTECTOMY    . COLON SURGERY     resection  . HIP PINNING,CANNULATED Left 07/03/2014   Procedure: CANNULATED HIP PINNING;  Surgeon: Claud Kelp, MD;  Location: [facility];  Service: Orthopedics;  Laterality: Left;  Marland Kitchen MASTECTOMY Bilateral   . VENA CAVA FILTER PLACEMENT      Family History  Problem Relation Age of Onset  . Cancer  Father        Bone  . Kidney cancer Neg Hx   . Prostate cancer Neg Hx   . Bladder Cancer Neg Hx     Social History:  reports that she has never smoked. She has never used smokeless tobacco. She reports that she does not drink alcohol and does not use drugs. Two sons died (one died at the age of 68 with AIDS another son died at age 67 of melanoma). She lives at the [address] complex [place]. She lives alone. Her friend Joycelyn Schmid lives upstairs.  The patient is alone today.  Participants in the patient's visit and their role in the encounter included the patient and Vito Berger, CMA, today.  The intake visit was provided by Vito Berger, CMA.   Allergies:  Allergies  Allergen Reactions  . Codeine Shortness Of Breath and Other (See Comments)    GI Upset  Pt states "makes heart flutter and trouble breathing" Other reaction(s): Other (See Comments) Chest Tightness, SOB, Tachycardia GI Upset  Pt states "makes heart flutter and trouble breathing" Other reaction(s): Other (See Comments) Chest Tightness, SOB, Tachycardia GI Upset  Pt states "makes heart flutter and trouble breathing"  . Duloxetine Shortness Of Breath  . Propoxyphene Shortness Of Breath    Other reaction(s): Other (See Comments) GI Upset  . Alendronate Sodium     Other reaction(s): Other (See Comments) GI Upset  . Atorvastatin     Other reaction(s): Other (See Comments) Myalgia  . Benadryl [Diphenhydramine Hcl] Other (See Comments)    Restless legs  . Cefuroxime Axetil Nausea Only  . Cymbalta [Duloxetine Hcl]   . Diphenhydramine   . Lipitor [Atorvastatin Calcium]   . Other     Other reaction(s): Other (See Comments) GI Upset  . Prednisone     Other reaction(s): Other (See Comments) Chest Pain, SOB, "can't breathe good" Unable to sleep  . Levofloxacin Rash and Other (See Comments)    Altered mental status    Current Medications: Current Outpatient Medications  Medication Sig  Dispense Refill  . acetaminophen (TYLENOL) 325 MG tablet Take 650 mg by mouth every 4 (four) hours as needed for mild pain or fever.     . ALPRAZolam (XANAX) 1 MG tablet Take 0.5 mg by mouth 2 (two) times daily as needed for anxiety.     . ferrous sulfate 325 (65 FE) MG tablet Take 1 tablet (325 mg total) by mouth in the morning, at noon, and at bedtime. 90 tablet 3  . levothyroxine (SYNTHROID, LEVOTHROID) 25 MCG tablet Take 25 mcg by mouth daily.     Marland Kitchen loperamide (IMODIUM) 2 MG capsule Take 2-4 mg by mouth 2 (two) times daily as needed for diarrhea or loose stools.    . metoprolol succinate (TOPROL-XL) 50 MG 24 hr tablet Take 50 mg by mouth daily.    Marland Kitchen  nortriptyline (PAMELOR) 50 MG capsule Take by mouth.    . pantoprazole (PROTONIX) 20 MG tablet Take 20 mg by mouth daily.    . rivaroxaban (XARELTO) 20 MG TABS tablet Take 1 tablet (20 mg total) by mouth daily. 30 tablet 1  . sertraline (ZOLOFT) 100 MG tablet Take 150 mg by mouth daily.     . vitamin B-12 1000 MCG tablet Take 1 tablet (1,000 mcg total) by mouth daily. 90 tablet 0   No current facility-administered medications for this visit.    Review of Systems  Constitutional: Positive for malaise/fatigue. Negative for chills, diaphoresis, fever and weight loss.       Does not feel well. Performing ADLs.  HENT: Negative for congestion, ear discharge, ear pain, hearing loss, nosebleeds, sinus pain, sore throat and tinnitus.   Eyes: Negative for blurred vision.  Respiratory: Positive for shortness of breath (minimal; exertional). Negative for cough, hemoptysis and sputum production.   Cardiovascular: Negative for chest pain, palpitations and leg swelling.  Gastrointestinal: Positive for diarrhea (chronic; on imodium; after drinking ensure and boost) and melena (on oral iron). Negative for abdominal pain, blood in stool, constipation, heartburn, nausea (occasional) and vomiting (occasional).       Eating better. Drinking ensure and boost. Picky  eater.  Genitourinary: Negative for dysuria, frequency, hematuria and urgency.  Musculoskeletal: Positive for back pain (lower back). Negative for joint pain, myalgias and neck pain.       Fibromyalgia.  Skin: Negative for itching and rash.  Neurological: Positive for dizziness (upon standing; once a day), weakness and headaches. Negative for tingling and sensory change.  Endo/Heme/Allergies: Does not bruise/bleed easily.  Psychiatric/Behavioral: Negative for depression and memory loss. The patient is not nervous/anxious and does not have insomnia.   All other systems reviewed and are negative.   Performance status (ECOG): 2   Appointment on 10/20/2019  Component Date Value Ref Range Status  . WBC 10/20/2019 9.0  4.0 - 10.5 K/uL Final  . RBC 10/20/2019 3.97  3.87 - 5.11 MIL/uL Final  . Hemoglobin 10/20/2019 12.3  12.0 - 15.0 g/dL Final  . HCT 10/20/2019 36.7  36 - 46 % Final  . MCV 10/20/2019 92.4  80.0 - 100.0 fL Final  . MCH 10/20/2019 31.0  26.0 - 34.0 pg Final  . MCHC 10/20/2019 33.5  30.0 - 36.0 g/dL Final  . RDW 10/20/2019 12.9  11.5 - 15.5 % Final  . Platelets 10/20/2019 212  150 - 400 K/uL Final  . nRBC 10/20/2019 0.0  0.0 - 0.2 % Final  . Neutrophils Relative % 10/20/2019 50  % Final  . Neutro Abs 10/20/2019 4.5  1.7 - 7.7 K/uL Final  . Lymphocytes Relative 10/20/2019 36  % Final  . Lymphs Abs 10/20/2019 3.2  0.7 - 4.0 K/uL Final  . Monocytes Relative 10/20/2019 12  % Final  . Monocytes Absolute 10/20/2019 1.0  0 - 1 K/uL Final  . Eosinophils Relative 10/20/2019 2  % Final  . Eosinophils Absolute 10/20/2019 0.2  0 - 0 K/uL Final  . Basophils Relative 10/20/2019 0  % Final  . Basophils Absolute 10/20/2019 0.0  0 - 0 K/uL Final  . Immature Granulocytes 10/20/2019 0  % Final  . Abs Immature Granulocytes 10/20/2019 0.02  0.00 - 0.07 K/uL Final   Performed at [facility], [address], [place], [place] [address]  . Sed Rate 10/20/2019 54* 0 - 30 mm/hr Final    Performed at [facility], [address], [place], [place] [address]  . Ferritin 10/20/2019 70  11 - 307 ng/mL Final   Performed at [facility], [address] [place], [place] [address]    Assessment:  Lauren Mccann is a 86 y.o. female with a neuroendocrine tumors/p right hemicolectomy on 10/29/2012. Pathology revealed a 2.0 cm grade I neuroendocrine tumor arising in the distal ileum. Tumor extended through the muscularis propria into the adjacent adipose tissue. Margins were negative. There was lymph-vascular invasion. Five of 14 lymph nodes were positive. Pathologic stage was T3N1. 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 She received SandostatinLAR 30 mg every 4 weeks for about 2 years.  Abdomen and pelvis CT on 04/30/2019 revealed post RIGHT hemicolectomy anatomy with no complicating features. There was no evidence of carcinoid tumor recurrence in the bowel, mesentery or liver. Infrarenal IVC filter was noted. She had diverticulosis of sigmoid colon without evidence acute diverticulitis.  Chromogranin A was 64.8 ng/mL (0-191.8) on 04/30/2019. 24 hour urine for 5HIAA was normal.  She hasiron deficiency anemia. Microcytic anemia has developed since 09/2018.  Labs on 04/28/2019 included aferritinof4 with an iron saturation of 2% and a TIBC 462 c/w severe iron deficiency anemia. Retic was 1.1%. B12was 269 (low). Normal labsincluded a folate,TSH, LDH,PT/INR andPTT. Urinalysis revealed no hematuria.  Dietis poor. She denies any melena, hematochezia, hematuria or vaginal bleeding. Stool was guaiac negative.  She received 1 unit ofPRBCson 04/28/2019 and 1 unit on 04/29/2019. She received Feraheme on 04/29/2019.  Ferritin has been followed: 4 on 04/28/2019, 291 on 05/20/2019, 108 on 06/16/2019, and 70 on 10/20/2019.  She has B12  deficiency.  B12 was 269 on 04/28/2019.  She is on oral B12.  She was diagnosed withextensive bilateral pulmonary emboli on 11/06/2011. There was a saddle embolus. There was a large pulmonary emboli within the right main pulmonary artery extending into the segmental branches of the right upper, right lower and right middle lobe. There were pulmonary emboli in the left upper lobe and peripheral branches of the left lower lobe artery segements. She was treated with Lovenox and discharged on Xarelto. Lower extremity duplexrevealed occlusive thrombus in the right popliteal vein and near occlusive thrombus in the proximal superficial femoral vein. An IVC filterwas placed. 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 She remains on Xarelto.  She has a history of bilateral breast cancer. She right breast cancer 20+ years ago. She underwent lumpectomy. Left breast cancer was diagnosed 7-8 years ago. She never received radiation or chemotherapy. No details are available.  Symptomatically, diarrhea is affecting quality of life.  She has dark stools on oral iron.  Plan: 1.   Review labs from 10/20/2019. 2.   Iron deficiency anemia Hematocrit 28.8.  Hemoglobin 8.9.  MCV 79.6 on 04/30/2019    Ferritin 4 with an iron saturation of 2% and a TIBC of 462 on 04/28/2019.  Hematocrit 34.3.  Hemoglobin 11.0.  MCV 88.4 on 06/16/2019    Ferritin 108 with an iron saturation of 25% and a TIBC of 326.  Hematocrit 36.7.  Hemoglobin 12.3.  MCV 92.4 on 10/20/2019.   Ferritin 70.  Patient last received Feraheme on 04/29/2019.  She notes dark stools on oral iron.  She denies any hematochezia hematuria or vaginal bleeding             Urinalysis revealed no hematuria.  Encourage patient to follow-up in the GI  [facility] clinic. 3.B12 deficiency B12 was 269 on 04/28/2019 and 1656 on 08/20/2019.  Etiology likely secondary to poor diet and possibly  malabsorption. She is on oral B12.  Check folate annually. 4.Diarrhea She has had chronic diarrhea since right hemicolectomy.   Diarrhea affects quality of life. Stool was negative for C diff.  Serum chromogranin was normal.             24 hour urine for 5HIAA was 1.1 mg/L (low) on 04/29/2019.  5.   Please schedule appt with GI [facility] ([place] well known to them re: significant diarrhea. 6.   RTC in 3 months for labs (CBC with diff, ferritin, iron studies, sed rate). 7.   RTC in 6 months for MD assessment, labs (CBC with diff, ferritin, iron studies- day before) and +/- Venofer.  I discussed the assessment and treatment plan with the patient.  The patient was provided an opportunity to ask questions and all were answered.  The patient agreed with the plan and demonstrated an understanding of the instructions.  The patient was advised to call back if the symptoms worsen or if the condition fails to improve as anticipated.  I provided 11 minutes (1:50 PM - 2:00 PM) of non face-to-face telephone visit time during this this encounter and > 50% was spent counseling as documented under my assessment and plan.  I provided these services from the [facility] office.   Lequita Asal, MD, PhD    10/21/2019, 1:50 PM  I, Selena Batten, am acting as scribe for [employer]. Mike Gip, MD, PhD.  I, Irwin Toran C. Mike Gip, MD, have reviewed the above documentation for accuracy and completeness, and I agree with the above.

## 2019-10-21 ENCOUNTER — Inpatient Hospital Stay (HOSPITAL_BASED_OUTPATIENT_CLINIC_OR_DEPARTMENT_OTHER): Payer: Medicare Other | Admitting: Hematology and Oncology

## 2019-10-21 ENCOUNTER — Encounter: Payer: Self-pay | Admitting: Hematology and Oncology

## 2019-10-21 ENCOUNTER — Ambulatory Visit: Payer: Medicare Other

## 2019-10-21 DIAGNOSIS — E538 Deficiency of other specified B group vitamins: Secondary | ICD-10-CM

## 2019-10-21 DIAGNOSIS — R197 Diarrhea, unspecified: Secondary | ICD-10-CM

## 2019-10-21 DIAGNOSIS — D508 Other iron deficiency anemias: Secondary | ICD-10-CM | POA: Diagnosis not present

## 2019-11-17 ENCOUNTER — Other Ambulatory Visit (HOSPITAL_COMMUNITY): Payer: Self-pay | Admitting: Student

## 2019-11-17 ENCOUNTER — Other Ambulatory Visit: Payer: Self-pay | Admitting: Student

## 2019-11-17 DIAGNOSIS — R1032 Left lower quadrant pain: Secondary | ICD-10-CM

## 2019-11-17 DIAGNOSIS — Z9049 Acquired absence of other specified parts of digestive tract: Secondary | ICD-10-CM

## 2019-11-17 DIAGNOSIS — K529 Noninfective gastroenteritis and colitis, unspecified: Secondary | ICD-10-CM

## 2019-11-17 DIAGNOSIS — Z859 Personal history of malignant neoplasm, unspecified: Secondary | ICD-10-CM

## 2019-12-03 ENCOUNTER — Ambulatory Visit: Admission: RE | Admit: 2019-12-03 | Payer: Medicare Other | Source: Ambulatory Visit

## 2020-01-24 ENCOUNTER — Other Ambulatory Visit: Payer: Medicare Other

## 2020-01-24 ENCOUNTER — Inpatient Hospital Stay: Payer: Medicare Other

## 2020-02-01 ENCOUNTER — Inpatient Hospital Stay: Payer: Medicare Other

## 2020-02-08 ENCOUNTER — Inpatient Hospital Stay: Payer: Medicare Other | Attending: Hematology and Oncology

## 2020-02-08 ENCOUNTER — Other Ambulatory Visit: Payer: Self-pay

## 2020-02-08 DIAGNOSIS — D509 Iron deficiency anemia, unspecified: Secondary | ICD-10-CM | POA: Insufficient documentation

## 2020-02-08 DIAGNOSIS — D508 Other iron deficiency anemias: Secondary | ICD-10-CM

## 2020-02-08 LAB — CBC WITH DIFFERENTIAL/PLATELET
Abs Immature Granulocytes: 0.03 10*3/uL (ref 0.00–0.07)
Basophils Absolute: 0.1 10*3/uL (ref 0.0–0.1)
Basophils Relative: 1 %
Eosinophils Absolute: 0.3 10*3/uL (ref 0.0–0.5)
Eosinophils Relative: 3 %
HCT: 34.8 % — ABNORMAL LOW (ref 36.0–46.0)
Hemoglobin: 11.8 g/dL — ABNORMAL LOW (ref 12.0–15.0)
Immature Granulocytes: 0 %
Lymphocytes Relative: 34 %
Lymphs Abs: 3.3 10*3/uL (ref 0.7–4.0)
MCH: 31.6 pg (ref 26.0–34.0)
MCHC: 33.9 g/dL (ref 30.0–36.0)
MCV: 93.3 fL (ref 80.0–100.0)
Monocytes Absolute: 1 10*3/uL (ref 0.1–1.0)
Monocytes Relative: 10 %
Neutro Abs: 5.3 10*3/uL (ref 1.7–7.7)
Neutrophils Relative %: 52 %
Platelets: 225 10*3/uL (ref 150–400)
RBC: 3.73 MIL/uL — ABNORMAL LOW (ref 3.87–5.11)
RDW: 13.1 % (ref 11.5–15.5)
WBC: 9.9 10*3/uL (ref 4.0–10.5)
nRBC: 0 % (ref 0.0–0.2)

## 2020-02-08 LAB — IRON AND TIBC
Iron: 58 ug/dL (ref 28–170)
Saturation Ratios: 20 % (ref 10.4–31.8)
TIBC: 295 ug/dL (ref 250–450)
UIBC: 237 ug/dL

## 2020-02-08 LAB — FERRITIN: Ferritin: 90 ng/mL (ref 11–307)

## 2020-02-08 LAB — SEDIMENTATION RATE: Sed Rate: 50 mm/hr — ABNORMAL HIGH (ref 0–30)

## 2020-02-08 NOTE — Progress Notes (Signed)
cbc

## 2020-02-09 ENCOUNTER — Telehealth: Payer: Self-pay

## 2020-02-09 NOTE — Telephone Encounter (Signed)
I called the patient to speak with her regarding her lab work yesterday. Patient was excited to know her lab work came back good and her iro saturation came back good as well.

## 2020-04-25 NOTE — Progress Notes (Incomplete)
[facility]  [address] [place], [place] [address] Phone: [phone]  Fax: [phone]   Telephone Visit:  04/25/2020  Referring physician: Derinda Late, MD  I connected with [place] on 10/21/2019 at 3:57 PM by telephone conferencing and verified that I was speaking with the correct person using 2 identifiers.  The patient was at home.  I discussed the limitations, risk, security and privacy concerns of performing an evaluation and management service by telephone conferencing and the availability of in person appointments.  I also discussed with the patient that there may be a patient responsible charge related to this service.  The patient expressed understanding and agreed to proceed.   Chief Complaint: Lauren Mccann is a 87 y.o. female with a history of a neuroendocrine tumor s/p right hemicolectomy, chronic diarrhea, and pulmonary emboli who is seen for 79month assessment.  HPI: The patient was last seen in the medical oncology clinic on 10/21/2019. At that time, diarrhea was affecting her quality of life.  She had dark stools on oral iron. Hematocrit was 36.7, hemoglobin 12.3, platelets 212,000, WBC 9,000. Ferritin was 70. Sed rate was 54. She was encouraged to follow-up with GI.  The patient spoke to a GI nurse by phone on 11/15/2019. Abdomen and pelvis CT was scheduled but the patient cancelled it because her diarrhea improved on Colestipol 1 gram daily.  Labs on 02/08/2020 revealed a hematocrit of 34.8, hemoglobin 11.8, MCV 93.3, platelets 225,000, WBC 9,900. Ferritin was 90 with an iron saturation of 20% and a TIBC of 295. Sed rate was 50.   During the interim, ***   Past Medical History:  Diagnosis Date  . A-fib ([place])   . Anemia   . Anxiety   . Arthritis    neck, back, knees  . Breast cancer ([place]) 03/11/2014   Overview:  S/p right mastectomy.  Reoccurrence s/p left matstectomy.  . Cancer ([place])    breast  . Closed  fracture of neck of left femur with routine healing 08/07/2014  . Depression   . Dyspnea    easily  . Fibromyalgia   . GERD (gastroesophageal reflux disease)   . Headache    everyday  . Hip fracture requiring operative repair ([place]) 07/04/2014  . History of neck problems    shots in neck every three months  . Hypertension   . Hyperthyroidism   . IBS (irritable bowel syndrome)   . Neuropathy   . Osteoporosis   . Pulmonary embolism ([place])    H/O  . Stroke [facility])    in past no residual effects  . Thyroid disease   . Tremor of both hands   . Vertigo    worse in past    Past Surgical History:  Procedure Laterality Date  . ABDOMINAL HYSTERECTOMY    . CATARACT EXTRACTION    . CATARACT EXTRACTION W/PHACO Right 08/18/2017   Procedure: CATARACT EXTRACTION PHACO AND INTRAOCULAR LENS PLACEMENT ([place]) right;  Surgeon: Eulogio Bear, MD;  Location: [facility];  Service: Ophthalmology;  Laterality: Right;  . CHOLECYSTECTOMY    . COLON SURGERY     resection  . HIP PINNING,CANNULATED Left 07/03/2014   Procedure: CANNULATED HIP PINNING;  Surgeon: Claud Kelp, MD;  Location: [facility];  Service: Orthopedics;  Laterality: Left;  Marland Kitchen MASTECTOMY Bilateral   . VENA CAVA FILTER PLACEMENT      Family History  Problem Relation Age of Onset  . Cancer Father  Bone  . Kidney cancer Neg Hx   . Prostate cancer Neg Hx   . Bladder Cancer Neg Hx     Social History:  reports that she has never smoked. She has never used smokeless tobacco. She reports that she does not drink alcohol and does not use drugs. Two sons died (one died at the age of 106 with AIDS another son died at age 33 of melanoma). She lives at the [address] [place] apartment complex in [place]. She lives alone. Her friend Joycelyn Schmid lives upstairs.  The patient is alone*** today.  Allergies:  Allergies  Allergen Reactions  . Codeine Shortness Of Breath and Other (See Comments)    GI Upset  Pt states "makes heart flutter and  trouble breathing" Other reaction(s): Other (See Comments) Chest Tightness, SOB, Tachycardia GI Upset  Pt states "makes heart flutter and trouble breathing" Other reaction(s): Other (See Comments) Chest Tightness, SOB, Tachycardia GI Upset  Pt states "makes heart flutter and trouble breathing"  . Duloxetine Shortness Of Breath  . Propoxyphene Shortness Of Breath    Other reaction(s): Other (See Comments) GI Upset  . Alendronate Sodium     Other reaction(s): Other (See Comments) GI Upset  . Atorvastatin     Other reaction(s): Other (See Comments) Myalgia  . Benadryl [Diphenhydramine Hcl] Other (See Comments)    Restless legs  . Cefuroxime Axetil Nausea Only  . Cymbalta [Duloxetine Hcl]   . Diphenhydramine   . Lipitor [Atorvastatin Calcium]   . Other     Other reaction(s): Other (See Comments) GI Upset  . Prednisone     Other reaction(s): Other (See Comments) Chest Pain, SOB, "can't breathe good" Unable to sleep  . Levofloxacin Rash and Other (See Comments)    Altered mental status    Current Medications: Current Outpatient Medications  Medication Sig Dispense Refill  . acetaminophen (TYLENOL) 325 MG tablet Take 650 mg by mouth every 4 (four) hours as needed for mild pain or fever.     . ALPRAZolam (XANAX) 1 MG tablet Take 0.5 mg by mouth 2 (two) times daily as needed for anxiety.     . ferrous sulfate 325 (65 FE) MG tablet Take 1 tablet (325 mg total) by mouth in the morning, at noon, and at bedtime. 90 tablet 3  . levothyroxine (SYNTHROID, LEVOTHROID) 25 MCG tablet Take 25 mcg by mouth daily.     Marland Kitchen loperamide (IMODIUM) 2 MG capsule Take 2-4 mg by mouth 2 (two) times daily as needed for diarrhea or loose stools.    . metoprolol succinate (TOPROL-XL) 50 MG 24 hr tablet Take 50 mg by mouth daily.    . nortriptyline (PAMELOR) 50 MG capsule Take by mouth.    . pantoprazole (PROTONIX) 20 MG tablet Take 20 mg by mouth daily.    . rivaroxaban (XARELTO) 20 MG TABS tablet Take 1  tablet (20 mg total) by mouth daily. 30 tablet 1  . sertraline (ZOLOFT) 100 MG tablet Take 150 mg by mouth daily.     . vitamin B-12 1000 MCG tablet Take 1 tablet (1,000 mcg total) by mouth daily. 90 tablet 0   No current facility-administered medications for this visit.    Review of Systems  Constitutional: Positive for malaise/fatigue. Negative for chills, diaphoresis, fever and weight loss.       Does not feel well. Performing ADLs.  HENT: Negative for congestion, ear discharge, ear pain, hearing loss, nosebleeds, sinus pain, sore throat and tinnitus.   Eyes: Negative for blurred vision.  Respiratory: Positive for shortness of breath (minimal; exertional). Negative for cough, hemoptysis and sputum production.   Cardiovascular: Negative for chest pain, palpitations and leg swelling.  Gastrointestinal: Positive for diarrhea (chronic; on imodium; after drinking ensure and boost) and melena (on oral iron). Negative for abdominal pain, blood in stool, constipation, heartburn, nausea (occasional) and vomiting (occasional).       Eating better. Drinking ensure and boost. Picky eater.  Genitourinary: Negative for dysuria, frequency, hematuria and urgency.  Musculoskeletal: Positive for back pain (lower back). Negative for joint pain, myalgias and neck pain.       Fibromyalgia.  Skin: Negative for itching and rash.  Neurological: Positive for dizziness (upon standing; once a day), weakness and headaches. Negative for tingling and sensory change.  Endo/Heme/Allergies: Does not bruise/bleed easily.  Psychiatric/Behavioral: Negative for depression and memory loss. The patient is not nervous/anxious and does not have insomnia.   All other systems reviewed and are negative.   Performance status (ECOG): 2***  Physical Exam Vitals and nursing note reviewed.  HENT:     Head: Normocephalic and atraumatic.     Mouth/Throat:     Mouth: Mucous membranes are moist.     Pharynx: Oropharynx is clear.   Eyes:     General: No scleral icterus.    Extraocular Movements: Extraocular movements intact.     Conjunctiva/sclera: Conjunctivae normal.     Pupils: Pupils are equal, round, and reactive to light.  Cardiovascular:     Rate and Rhythm: Normal rate and regular rhythm.     Heart sounds: Normal heart sounds. No murmur heard.   Pulmonary:     Effort: Pulmonary effort is normal. No respiratory distress.     Breath sounds: Normal breath sounds. No wheezing or rales.  Chest:     Chest wall: No tenderness.  Breasts:     Right: No axillary adenopathy or supraclavicular adenopathy.     Left: No axillary adenopathy or supraclavicular adenopathy.    Abdominal:     General: Bowel sounds are normal. There is no distension.     Palpations: Abdomen is soft. There is no mass.     Tenderness: There is no abdominal tenderness. There is no guarding or rebound.  Musculoskeletal:        General: No swelling or tenderness. Normal range of motion.     Cervical back: Normal range of motion and neck supple.  Lymphadenopathy:     Head:     Right side of head: No preauricular, posterior auricular or occipital adenopathy.     Left side of head: No preauricular, posterior auricular or occipital adenopathy.     Cervical: No cervical adenopathy.     Upper Body:     Right upper body: No supraclavicular or axillary adenopathy.     Left upper body: No supraclavicular or axillary adenopathy.     Lower Body: No right inguinal adenopathy. No left inguinal adenopathy.  Skin:    General: Skin is warm and dry.  Neurological:     Mental Status: She is alert and oriented to person, place, and time.  Psychiatric:        Behavior: Behavior normal.        Thought Content: Thought content normal.        Judgment: Judgment normal.     No visits with results within 3 Day(s) from this visit.  Latest known visit with results is:  Appointment on 02/08/2020  Component Date Value Ref Range Status  . Sed Rate  02/08/2020 50* 0 - 30 mm/hr Final   Performed at [facility], [address] [place], [place] [address]  . Iron 02/08/2020 58  28 - 170 ug/dL Final  . TIBC 02/08/2020 295  250 - 450 ug/dL Final  . Saturation Ratios 02/08/2020 20  10.4 - 31.8 % Final  . UIBC 02/08/2020 237  ug/dL Final   Performed at [facility], [address], [place], [place] [address]  . Ferritin 02/08/2020 90  11 - 307 ng/mL Final   Performed at [facility], [address] [place], [place] [address]  . WBC 02/08/2020 9.9  4.0 - 10.5 K/uL Final  . RBC 02/08/2020 3.73* 3.87 - 5.11 MIL/uL Final  . Hemoglobin 02/08/2020 11.8* 12.0 - 15.0 g/dL Final  . HCT 02/08/2020 34.8* 36.0 - 46.0 % Final  . MCV 02/08/2020 93.3  80.0 - 100.0 fL Final  . MCH 02/08/2020 31.6  26.0 - 34.0 pg Final  . MCHC 02/08/2020 33.9  30.0 - 36.0 g/dL Final  . RDW 02/08/2020 13.1  11.5 - 15.5 % Final  . Platelets 02/08/2020 225  150 - 400 K/uL Final  . nRBC 02/08/2020 0.0  0.0 - 0.2 % Final  . Neutrophils Relative % 02/08/2020 52  % Final  . Neutro Abs 02/08/2020 5.3  1.7 - 7.7 K/uL Final  . Lymphocytes Relative 02/08/2020 34  % Final  . Lymphs Abs 02/08/2020 3.3  0.7 - 4.0 K/uL Final  . Monocytes Relative 02/08/2020 10  % Final  . Monocytes Absolute 02/08/2020 1.0  0.1 - 1.0 K/uL Final  . Eosinophils Relative 02/08/2020 3  % Final  . Eosinophils Absolute 02/08/2020 0.3  0.0 - 0.5 K/uL Final  . Basophils Relative 02/08/2020 1  % Final  . Basophils Absolute 02/08/2020 0.1  0.0 - 0.1 K/uL Final  . Immature Granulocytes 02/08/2020 0  % Final  . Abs Immature Granulocytes 02/08/2020 0.03  0.00 - 0.07 K/uL Final   Performed at [facility], [address], [place], [place] [address]    Assessment:  DESREE LEAP is a 87 y.o. female with a neuroendocrine tumors/p right hemicolectomy on 10/29/2012. Pathology revealed a 2.0 cm grade I neuroendocrine tumor arising in the distal ileum. Tumor extended through  the muscularis propria into the adjacent adipose tissue. Margins were negative. There was lymph-vascular invasion. Five of 14 lymph nodes were positive. Pathologic stage was T3N1. 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 She received SandostatinLAR 30 mg every 4 weeks for about 2 years.  Abdomen and pelvis CT on 04/30/2019 revealed post RIGHT hemicolectomy anatomy with no complicating features. There was no evidence of carcinoid tumor recurrence in the bowel, mesentery or liver. Infrarenal IVC filter was noted. She had diverticulosis of sigmoid colon without evidence acute diverticulitis.  Chromogranin A was 64.8 ng/mL (0-191.8) on 04/30/2019. 24 hour urine for 5HIAA was normal.  She hasiron deficiency anemia. Microcytic anemia has developed since 09/2018.  Labs on 04/28/2019 included aferritinof4 with an iron saturation of 2% and a TIBC 462 c/w severe iron deficiency anemia. Retic was 1.1%. B12was 269 (low). Normal labsincluded a folate,TSH, LDH,PT/INR andPTT. Urinalysis revealed no hematuria.  Dietis poor. She denies any melena, hematochezia, hematuria or vaginal bleeding. Stool was guaiac negative.  She received 1 unit ofPRBCson 04/28/2019 and 1 unit on 04/29/2019. She received Feraheme on 04/29/2019.  Ferritin has been followed: 4 on 04/28/2019, 291  on 05/20/2019, 108 on 06/16/2019, and 70 on 10/20/2019.  She has B12 deficiency.  B12 was 269 on 04/28/2019.  She is on oral B12.  She was diagnosed withextensive bilateral pulmonary emboli on 11/06/2011. There was a saddle embolus. There was a large pulmonary emboli within the right main pulmonary artery extending into the segmental branches of the right upper, right lower and right middle lobe. There were pulmonary emboli in the left upper lobe and peripheral branches of the left lower lobe artery segements.  She was treated with Lovenox and discharged on Xarelto. Lower extremity duplexrevealed occlusive thrombus in the right popliteal vein and near occlusive thrombus in the proximal superficial femoral vein. An IVC filterwas placed. 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 She remains on Xarelto.  She has a history of bilateral breast cancer. She right breast cancer 20+ years ago. She underwent lumpectomy. Left breast cancer was diagnosed 7-8 years ago. She never received radiation or chemotherapy. No details are available.  Symptomatically, ***  Plan: 1.   Review labs***   2.   Iron deficiency anemia Hematocrit 28.8.  Hemoglobin 8.9.  MCV 79.6 on 04/30/2019    Ferritin 4 with an iron saturation of 2% and a TIBC of 462 on 04/28/2019.  Hematocrit 34.3.  Hemoglobin 11.0.  MCV 88.4 on 06/16/2019    Ferritin 108 with an iron saturation of 25% and a TIBC of 326.  Hematocrit 36.7.  Hemoglobin 12.3.  MCV 92.4 on 10/20/2019.   Ferritin 70.  Patient last received Feraheme on 04/29/2019.  She notes dark stools on oral iron.  She denies any hematochezia hematuria or vaginal bleeding             Urinalysis revealed no hematuria.  Encourage patient to follow-up in the GI [place] clinic. 3.B12 deficiency B12 was 269 on 04/28/2019 and 1656 on 08/20/2019.  Etiology likely secondary to poor diet and possibly malabsorption. She is on oral B12.  Check folate annually. 4.Diarrhea She has had chronic diarrhea since right hemicolectomy.   Diarrhea affects quality of life. Stool was negative for C diff.  Serum chromogranin was normal.             24 hour urine for 5HIAA was 1.1 mg/L (low) on 04/29/2019.  5.   Please schedule appt with GI [facility] ([place])- well known to them re: significant diarrhea. 6.   RTC in 3 months for labs (CBC with diff, ferritin, iron studies, sed  rate). 7.   RTC in 6 months for MD assessment, labs (CBC with diff, ferritin, iron studies- day before) and +/- Venofer.  I discussed the assessment and treatment plan with the patient.  The patient was provided an opportunity to ask questions and all were answered.  The patient agreed with the plan and demonstrated an understanding of the instructions.  The patient was advised to call back if the symptoms worsen or if the condition fails to improve as anticipated.  I provided *** minutes of face-to-face time during this this encounter and > 50% was spent counseling as documented under my assessment and plan.  Lequita Asal, MD, PhD    04/25/2020, 3:57 PM  I, Mirian Mo Tufford, am acting as Education administrator for [employer]. Mike Gip, MD, PhD.  I, Melissa C. Mike Gip, MD, have reviewed the above documentation for accuracy and completeness, and I agree with the above.

## 2020-04-26 ENCOUNTER — Telehealth: Payer: Self-pay | Admitting: Hematology and Oncology

## 2020-04-26 ENCOUNTER — Telehealth: Payer: Self-pay | Admitting: *Deleted

## 2020-04-26 ENCOUNTER — Other Ambulatory Visit: Payer: Medicare Other

## 2020-04-26 ENCOUNTER — Inpatient Hospital Stay: Payer: Medicare Other

## 2020-04-26 NOTE — Telephone Encounter (Signed)
Patient called reporting that she cannot come for her appointment today due to being too dizzy when she gets up. She states that she has no other transportation available. I have transferred her to [facility] Scheduler in [place] to see if we can perhaps get her a ride by our [employer].  Anderson Malta had arranged for her to get transported by our Lucianne Lei for her appointment today, but she has refused to come in today stating she just does not feel like going anywhere. She states she had a UTI when she went to see her PCP a couple weeks ago and that she has been vomiting. I stressed that she really needs to be seen, but she adamantly refused to come and so her appointment is being rescheduled per her request

## 2020-04-26 NOTE — Telephone Encounter (Signed)
Appointments r/s for labs on 3/1 and telephone visit on 3/2. Pt confirmed these dates and times--mailing updated AVS.   Anderson Malta

## 2020-04-26 NOTE — Telephone Encounter (Signed)
Triage RN transferred call from pt. Pt stated that she was sick to her stomach and needed to r/s her appts. Appts moved to 3/1 and 3/2. Confirmed with pt that the lab appts on 3/1 are at [facility] and that her appt on 3/2 is via telephone. Mailing AVS.

## 2020-04-27 ENCOUNTER — Inpatient Hospital Stay: Payer: Medicare Other | Admitting: Hematology and Oncology

## 2020-05-02 ENCOUNTER — Inpatient Hospital Stay: Payer: Medicare Other | Attending: Hematology and Oncology

## 2020-05-02 ENCOUNTER — Other Ambulatory Visit: Payer: Self-pay

## 2020-05-02 DIAGNOSIS — D509 Iron deficiency anemia, unspecified: Secondary | ICD-10-CM | POA: Diagnosis not present

## 2020-05-02 DIAGNOSIS — R197 Diarrhea, unspecified: Secondary | ICD-10-CM | POA: Diagnosis not present

## 2020-05-02 DIAGNOSIS — Z7901 Long term (current) use of anticoagulants: Secondary | ICD-10-CM | POA: Insufficient documentation

## 2020-05-02 DIAGNOSIS — E340 Carcinoid syndrome: Secondary | ICD-10-CM | POA: Diagnosis not present

## 2020-05-02 DIAGNOSIS — C7A012 Malignant carcinoid tumor of the ileum: Secondary | ICD-10-CM | POA: Insufficient documentation

## 2020-05-02 DIAGNOSIS — E538 Deficiency of other specified B group vitamins: Secondary | ICD-10-CM | POA: Diagnosis not present

## 2020-05-02 DIAGNOSIS — D508 Other iron deficiency anemias: Secondary | ICD-10-CM

## 2020-05-02 DIAGNOSIS — Z86711 Personal history of pulmonary embolism: Secondary | ICD-10-CM | POA: Insufficient documentation

## 2020-05-02 LAB — IRON AND TIBC
Iron: 45 ug/dL (ref 28–170)
Saturation Ratios: 15 % (ref 10.4–31.8)
TIBC: 298 ug/dL (ref 250–450)
UIBC: 253 ug/dL

## 2020-05-02 LAB — CBC WITH DIFFERENTIAL/PLATELET
Abs Immature Granulocytes: 0.02 10*3/uL (ref 0.00–0.07)
Basophils Absolute: 0 10*3/uL (ref 0.0–0.1)
Basophils Relative: 0 %
Eosinophils Absolute: 0.3 10*3/uL (ref 0.0–0.5)
Eosinophils Relative: 3 %
HCT: 34.9 % — ABNORMAL LOW (ref 36.0–46.0)
Hemoglobin: 11.4 g/dL — ABNORMAL LOW (ref 12.0–15.0)
Immature Granulocytes: 0 %
Lymphocytes Relative: 31 %
Lymphs Abs: 2.9 10*3/uL (ref 0.7–4.0)
MCH: 31.2 pg (ref 26.0–34.0)
MCHC: 32.7 g/dL (ref 30.0–36.0)
MCV: 95.6 fL (ref 80.0–100.0)
Monocytes Absolute: 1.2 10*3/uL — ABNORMAL HIGH (ref 0.1–1.0)
Monocytes Relative: 13 %
Neutro Abs: 5 10*3/uL (ref 1.7–7.7)
Neutrophils Relative %: 53 %
Platelets: 192 10*3/uL (ref 150–400)
RBC: 3.65 MIL/uL — ABNORMAL LOW (ref 3.87–5.11)
RDW: 12.6 % (ref 11.5–15.5)
WBC: 9.4 10*3/uL (ref 4.0–10.5)
nRBC: 0 % (ref 0.0–0.2)

## 2020-05-02 LAB — FERRITIN: Ferritin: 58 ng/mL (ref 11–307)

## 2020-05-02 LAB — SEDIMENTATION RATE: Sed Rate: 57 mm/hr — ABNORMAL HIGH (ref 0–30)

## 2020-05-02 NOTE — Progress Notes (Signed)
[facility]  [address] [place], [place] [address] Phone: [phone]  Fax: [phone]   Telephone Visit:  05/03/2020  Referring physician: Derinda Late, MD  I connected with Lauren Mccann on 05/03/2020 at 2:53 PM by telephone conferencing and verified that I was speaking with the correct person using 2 identifiers. The patient was at home.  I discussed the limitations, risk, security and privacy concerns of performing an evaluation and management service by telephone conferencing and the availability of in person appointments.  I also discussed with the patient that there may be a patient responsible charge related to this service.  The patient expressed understanding and agreed to proceed.   Chief Complaint: Lauren Mccann is a 87 y.o. female with a history of a neuroendocrine tumor s/p right hemicolectomy, chronic diarrhea, and pulmonary emboli who is seen for 6 month assessment.  HPI: The patient was last seen in the medical oncology clinic on 10/21/2019. At that time, diarrhea was affecting her quality of life.  She had dark stools on oral iron. Hematocrit was 36.7, hemoglobin 12.3, platelets 212,000, WBC 9,000. Ferritin was 70. Sed rate was 54. She was encouraged to follow-up with GI.  The patient spoke to a GI nurse by phone on 11/15/2019. Abdomen and pelvis CT was scheduled but the patient cancelled it because her diarrhea improved on Colestipol 1 gram daily.  CBC has been followed: 02/08/2020:  Hematocrit 34.8, hemoglobin 11.8, MCV 93.3, platelets 225,000, WBC 9,900.  Ferritin 90 with an iron saturation of 20% and a TIBC of 295. Sed rate 50.  05/02/2020:  Hematocrit 34.9, hemoglobin 11.4, MCV 95.6, platelets 192,000, WBC 9,400.  Ferritin 58 with an iron saturation of 15% and a TIBC of 298.  Sed rate 57.  During the interim, she has been okay. She states that yesterday was not a good day for her. She could not think straight or focus.  She started taking metoprolol, levothyroxine, and oxybutynin 1-2 weeks ago and is not sure if those are causing her symptoms. She feels better today.  Her bowels were "awful" until she was put on the medications listed above. Her diarrhea has improved. She is not taking colestipol.   Her back pain, melena, and shortness of breath are stable. She takes oral iron TID. She take vitamin B12 everyday but did not take it this morning.   Past Medical History:  Diagnosis Date  . A-fib ([place])   . Anemia   . Anxiety   . Arthritis    neck, back, knees  . Breast cancer ([place]) 03/11/2014   Overview:  S/p right mastectomy.  Reoccurrence s/p left matstectomy.  . Cancer ([place])    breast  . Closed fracture of neck of left femur with routine healing 08/07/2014  . Depression   . Dyspnea    easily  . Fibromyalgia   . GERD (gastroesophageal reflux disease)   . Headache    everyday  . Hip fracture requiring operative repair ([place]) 07/04/2014  . History of neck problems    shots in neck every three months  . Hypertension   . Hyperthyroidism   . IBS (irritable bowel syndrome)   . Neuropathy   . Osteoporosis   . Pulmonary embolism ([place])    H/O  . Stroke [facility])    in past no residual effects  . Thyroid disease   . Tremor of both hands   . Vertigo    worse in past    Past Surgical  History:  Procedure Laterality Date  . ABDOMINAL HYSTERECTOMY    . CATARACT EXTRACTION    . CATARACT EXTRACTION W/PHACO Right 08/18/2017   Procedure: CATARACT EXTRACTION PHACO AND INTRAOCULAR LENS PLACEMENT ([place]) right;  Surgeon: Eulogio Bear, MD;  Location: [facility];  Service: Ophthalmology;  Laterality: Right;  . CHOLECYSTECTOMY    . COLON SURGERY     resection  . HIP PINNING,CANNULATED Left 07/03/2014   Procedure: CANNULATED HIP PINNING;  Surgeon: Claud Kelp, MD;  Location: [facility];  Service: Orthopedics;  Laterality: Left;  Marland Kitchen MASTECTOMY Bilateral   . VENA CAVA FILTER PLACEMENT      Family History   Problem Relation Age of Onset  . Cancer Father        Bone  . Kidney cancer Neg Hx   . Prostate cancer Neg Hx   . Bladder Cancer Neg Hx     Social History:  reports that she has never smoked. She has never used smokeless tobacco. She reports that she does not drink alcohol and does not use drugs. Two sons died (one died at the age of 79 with AIDS another son died at age 49 of melanoma). She lives at the [place] apartment complex in [place]. She lives alone. Her friend Joycelyn Schmid lives upstairs.  The patient is alone today.  Participants in the patient's visit and their role in the encounter included the patient and Baxter Flattery Day, CMA today.  The intake visit was provided by Baxter Flattery Day, CMA.  Allergies:  Allergies  Allergen Reactions  . Codeine Shortness Of Breath and Other (See Comments)    GI Upset  Pt states "makes heart flutter and trouble breathing" Other reaction(s): Other (See Comments) Chest Tightness, SOB, Tachycardia GI Upset  Pt states "makes heart flutter and trouble breathing" Other reaction(s): Other (See Comments) Chest Tightness, SOB, Tachycardia GI Upset  Pt states "makes heart flutter and trouble breathing"  . Duloxetine Shortness Of Breath  . Propoxyphene Shortness Of Breath    Other reaction(s): Other (See Comments) GI Upset  . Alendronate Sodium     Other reaction(s): Other (See Comments) GI Upset  . Atorvastatin     Other reaction(s): Other (See Comments) Myalgia  . Benadryl [Diphenhydramine Hcl] Other (See Comments)    Restless legs  . Cefuroxime Axetil Nausea Only  . Cymbalta [Duloxetine Hcl]   . Diphenhydramine   . Lipitor [Atorvastatin Calcium]   . Other     Other reaction(s): Other (See Comments) GI Upset  . Prednisone     Other reaction(s): Other (See Comments) Chest Pain, SOB, "can't breathe good" Unable to sleep  . Levofloxacin Rash and Other (See Comments)    Altered mental status    Current Medications: Current Outpatient Medications   Medication Sig Dispense Refill  . acetaminophen (TYLENOL) 325 MG tablet Take 650 mg by mouth every 4 (four) hours as needed for mild pain or fever.     . ALPRAZolam (XANAX) 1 MG tablet Take 0.5 mg by mouth 2 (two) times daily as needed for anxiety.     . ferrous sulfate 325 (65 FE) MG tablet Take 1 tablet (325 mg total) by mouth in the morning, at noon, and at bedtime. 90 tablet 3  . levothyroxine (SYNTHROID, LEVOTHROID) 25 MCG tablet Take 25 mcg by mouth daily.     Marland Kitchen loperamide (IMODIUM) 2 MG capsule Take 2-4 mg by mouth 2 (two) times daily as needed for diarrhea or loose stools.    . metoprolol succinate (TOPROL-XL)  50 MG 24 hr tablet Take 50 mg by mouth daily.    . nortriptyline (PAMELOR) 50 MG capsule Take by mouth.    . oxybutynin (DITROPAN-XL) 10 MG 24 hr tablet Take 1 tablet by mouth daily.    . pantoprazole (PROTONIX) 20 MG tablet Take 20 mg by mouth daily.    . rivaroxaban (XARELTO) 20 MG TABS tablet Take 1 tablet (20 mg total) by mouth daily. 30 tablet 1  . sertraline (ZOLOFT) 100 MG tablet Take 150 mg by mouth daily.     . vitamin B-12 1000 MCG tablet Take 1 tablet (1,000 mcg total) by mouth daily. 90 tablet 0   No current facility-administered medications for this visit.    Review of Systems  Constitutional: Positive for malaise/fatigue. Negative for chills, diaphoresis and fever. Weight loss: no new weight.       Does not feel well. Performing ADLs.  HENT: Negative for congestion, ear discharge, ear pain, hearing loss, nosebleeds, sinus pain, sore throat and tinnitus.   Eyes: Negative for blurred vision.  Respiratory: Positive for shortness of breath (minimal; exertional). Negative for cough, hemoptysis and sputum production.   Cardiovascular: Negative for chest pain, palpitations and leg swelling.  Gastrointestinal: Positive for diarrhea (improved) and melena (on oral iron). Negative for abdominal pain, blood in stool, constipation, heartburn, nausea (occasional) and vomiting  (occasional).       Eating better. Drinking ensure and boost. Picky eater.  Genitourinary: Negative for dysuria, frequency, hematuria and urgency.  Musculoskeletal: Positive for back pain (lower back). Negative for joint pain, myalgias and neck pain.       Fibromyalgia.  Skin: Negative for itching and rash.  Neurological: Positive for dizziness (upon standing; once a day), weakness and headaches. Negative for tingling and sensory change.       Having trouble focusing and thinking straight  Endo/Heme/Allergies: Does not bruise/bleed easily.  Psychiatric/Behavioral: Negative for depression and memory loss. The patient is not nervous/anxious and does not have insomnia.   All other systems reviewed and are negative.   Performance status (ECOG): 2  Vital Signs There were no vitals taken for this visit.   Appointment on 05/02/2020  Component Date Value Ref Range Status  . Sed Rate 05/02/2020 57* 0 - 30 mm/hr Final   Performed at [facility], [address] [place], [place] [address]  . Iron 05/02/2020 45  28 - 170 ug/dL Final  . TIBC 05/02/2020 298  250 - 450 ug/dL Final  . Saturation Ratios 05/02/2020 15  10.4 - 31.8 % Final  . UIBC 05/02/2020 253  ug/dL Final   Performed at [facility], [address], [place], [place] [address]  . Ferritin 05/02/2020 58  11 - 307 ng/mL Final   Performed at [facility], [address] [place],  [address]  . WBC 05/02/2020 9.4  4.0 - 10.5 K/uL Final  . RBC 05/02/2020 3.65* 3.87 - 5.11 MIL/uL Final  . Hemoglobin 05/02/2020 11.4* 12.0 - 15.0 g/dL Final  . HCT 05/02/2020 34.9* 36.0 - 46.0 % Final  . MCV 05/02/2020 95.6  80.0 - 100.0 fL Final  . MCH 05/02/2020 31.2  26.0 - 34.0 pg Final  . MCHC 05/02/2020 32.7  30.0 - 36.0 g/dL Final  . RDW 05/02/2020 12.6  11.5 - 15.5 % Final  . Platelets 05/02/2020 192  150 - 400 K/uL Final  . nRBC 05/02/2020 0.0  0.0 - 0.2 % Final  . Neutrophils Relative % 05/02/2020 53  %  Final  . Neutro Abs 05/02/2020 5.0  1.7 - 7.7 K/uL Final  . Lymphocytes Relative 05/02/2020 31  % Final  . Lymphs Abs 05/02/2020 2.9  0.7 - 4.0 K/uL Final  . Monocytes Relative 05/02/2020 13  % Final  . Monocytes Absolute 05/02/2020 1.2* 0.1 - 1.0 K/uL Final  . Eosinophils Relative 05/02/2020 3  % Final  . Eosinophils Absolute 05/02/2020 0.3  0.0 - 0.5 K/uL Final  . Basophils Relative 05/02/2020 0  % Final  . Basophils Absolute 05/02/2020 0.0  0.0 - 0.1 K/uL Final  . Immature Granulocytes 05/02/2020 0  % Final  . Abs Immature Granulocytes 05/02/2020 0.02  0.00 - 0.07 K/uL Final   Performed at [facility], [address], [place], [place] [address]    Assessment:  Lauren Mccann is a 87 y.o. female with a neuroendocrine tumors/p right hemicolectomy on 10/29/2012. Pathology revealed a 2.0 cm grade I neuroendocrine tumor arising in the distal ileum. Tumor extended through the muscularis propria into the adjacent adipose tissue. Margins were negative. There was lymph-vascular invasion. Five of 14 lymph nodes were positive. Pathologic stage was T3N1. 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 She received SandostatinLAR 30 mg every 4 weeks for about 2 years.  Abdomen and pelvis CT on 04/30/2019 revealed post RIGHT hemicolectomy anatomy with no complicating features. There was no evidence of carcinoid tumor recurrence in the bowel, mesentery or liver. Infrarenal IVC filter was noted. She had diverticulosis of sigmoid colon without evidence acute diverticulitis.  Chromogranin A was 64.8 ng/mL (0-191.8) on 04/30/2019. 24 hour urine for 5HIAA was normal.  She hasiron deficiency anemia. Microcytic anemia has developed since 09/2018.  Labs on 04/28/2019 included aferritinof4 with an iron saturation of 2% and a TIBC 462 c/w severe iron deficiency anemia. Retic was 1.1%.  B12was 269 (low). Normal labsincluded a folate,TSH, LDH,PT/INR andPTT. Urinalysis revealed no hematuria.  Dietis poor. She denies any melena, hematochezia, hematuria or vaginal bleeding. Stool was guaiac negative.  She received 1 unit ofPRBCson 04/28/2019 and 1 unit on 04/29/2019. She received Feraheme on 04/29/2019.  Ferritin has been followed: 4 on 04/28/2019, 291 on 05/20/2019, 108 on 06/16/2019, 70 on 10/20/2019, 90 on 02/08/2020 and 58 on 05/02/2020.  She has B12 deficiency.  B12 was 269 on 04/28/2019.  She is on oral B12.  She was diagnosed withextensive bilateral pulmonary emboli on 11/06/2011. There was a saddle embolus. There was a large pulmonary emboli within the right main pulmonary artery extending into the segmental branches of the right upper, right lower and right middle lobe. There were pulmonary emboli in the left upper lobe and peripheral branches of the left lower lobe artery segements. She was treated with Lovenox and discharged on Xarelto. Lower extremity duplexrevealed occlusive thrombus in the right popliteal vein and near occlusive thrombus in the proximal superficial femoral vein. An IVC filterwas placed. 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 She remains on Xarelto.  She has a history of bilateral breast cancer. She right breast cancer 20+ years ago. She underwent lumpectomy. Left breast cancer was diagnosed 7-8 years ago. She never received radiation or chemotherapy. No details are available.  Symptomatically,  she feels okay. Her diarrhea has improved. She is not taking colestipol.   Her back pain, melena, and shortness of breath are stable. She takes oral iron TID. She take vitamin B12 everyday  Plan: 1.  Review labs from 05/02/2020. 2.   Iron deficiency anemia  Hematocrit 34.9.  Hemoglobin 11.4.  MCV 95.6 on 05/02/2020.   Ferritin 58 with an iron saturation of 15% and a TIBC of 298.  Patient  last received Feraheme on 04/29/2019.  She has dark stools on oral iron.  She denies any hematochezia hematuria or vaginal bleeding             Urinalysis revealed no hematuria.  No IV iron needed.    Continue to monitor. 3.B12 deficiency B12 was 269 on 04/28/2019 and 1656 on 08/20/2019.  Etiology likely secondary to poor diet and possibly malabsorption. Check B12 and folate annually. 4.Diarrhea She has had chronic diarrhea since right hemicolectomy.   Diarrhea has improved. Stool was negative for C diff.  Serum chromogranin was normal.             24 hour urine for 5HIAA was 1.1 mg/L (low) on 04/29/2019.  5.   RTC in 3 months for labs (CBC with diff, ferritin, iron studies, sed rate). 6.   RTC in 6 months for MD assessment, labs (CBC with diff, ferritin, iron studies- day before) and +/- Venofer.  I discussed the assessment and treatment plan with the patient.  The patient was provided an opportunity to ask questions and all were answered.  The patient agreed with the plan and demonstrated an understanding of the instructions.  The patient was advised to call back if the symptoms worsen or if the condition fails to improve as anticipated.  I provided 16 minutes of non face-to-face telephone visit time during this this encounter and > 50% was spent counseling as documented under my assessment and plan.  I provided these services from the [facility] office.   Lequita Asal, MD, PhD    05/03/2020, 2:53 PM  I, Mirian Mo Tufford, am acting as Education administrator for [employer]. Mike Gip, MD, PhD.  I, Graeme Menees C. Mike Gip, MD, have reviewed the above documentation for accuracy and completeness, and I agree with the above.

## 2020-05-03 ENCOUNTER — Inpatient Hospital Stay (HOSPITAL_BASED_OUTPATIENT_CLINIC_OR_DEPARTMENT_OTHER): Payer: Medicare Other | Admitting: Hematology and Oncology

## 2020-05-03 ENCOUNTER — Other Ambulatory Visit: Payer: Self-pay

## 2020-05-03 ENCOUNTER — Encounter: Payer: Self-pay | Admitting: Hematology and Oncology

## 2020-05-03 DIAGNOSIS — C7A012 Malignant carcinoid tumor of the ileum: Secondary | ICD-10-CM

## 2020-05-03 DIAGNOSIS — D508 Other iron deficiency anemias: Secondary | ICD-10-CM

## 2020-05-03 DIAGNOSIS — E538 Deficiency of other specified B group vitamins: Secondary | ICD-10-CM | POA: Diagnosis not present

## 2020-08-03 ENCOUNTER — Other Ambulatory Visit: Payer: Self-pay

## 2020-08-03 ENCOUNTER — Inpatient Hospital Stay: Payer: Medicare Other | Attending: Oncology

## 2020-08-03 DIAGNOSIS — C7A012 Malignant carcinoid tumor of the ileum: Secondary | ICD-10-CM

## 2020-08-03 DIAGNOSIS — D508 Other iron deficiency anemias: Secondary | ICD-10-CM

## 2020-09-07 ENCOUNTER — Inpatient Hospital Stay: Payer: Medicare Other | Attending: Oncology

## 2020-09-07 DIAGNOSIS — D508 Other iron deficiency anemias: Secondary | ICD-10-CM

## 2020-09-07 DIAGNOSIS — C7A012 Malignant carcinoid tumor of the ileum: Secondary | ICD-10-CM | POA: Insufficient documentation

## 2020-09-07 LAB — SEDIMENTATION RATE: Sed Rate: 56 mm/hr — ABNORMAL HIGH (ref 0–30)

## 2020-09-07 LAB — CBC WITH DIFFERENTIAL/PLATELET
Abs Immature Granulocytes: 0.03 10*3/uL (ref 0.00–0.07)
Basophils Absolute: 0 10*3/uL (ref 0.0–0.1)
Basophils Relative: 0 %
Eosinophils Absolute: 0.2 10*3/uL (ref 0.0–0.5)
Eosinophils Relative: 2 %
HCT: 32.8 % — ABNORMAL LOW (ref 36.0–46.0)
Hemoglobin: 10.8 g/dL — ABNORMAL LOW (ref 12.0–15.0)
Immature Granulocytes: 0 %
Lymphocytes Relative: 31 %
Lymphs Abs: 3.2 10*3/uL (ref 0.7–4.0)
MCH: 30.7 pg (ref 26.0–34.0)
MCHC: 32.9 g/dL (ref 30.0–36.0)
MCV: 93.2 fL (ref 80.0–100.0)
Monocytes Absolute: 1.1 10*3/uL — ABNORMAL HIGH (ref 0.1–1.0)
Monocytes Relative: 11 %
Neutro Abs: 5.6 10*3/uL (ref 1.7–7.7)
Neutrophils Relative %: 56 %
Platelets: 225 10*3/uL (ref 150–400)
RBC: 3.52 MIL/uL — ABNORMAL LOW (ref 3.87–5.11)
RDW: 12.9 % (ref 11.5–15.5)
WBC: 10.2 10*3/uL (ref 4.0–10.5)
nRBC: 0 % (ref 0.0–0.2)

## 2020-09-07 LAB — IRON AND TIBC
Iron: 65 ug/dL (ref 28–170)
Saturation Ratios: 21 % (ref 10.4–31.8)
TIBC: 318 ug/dL (ref 250–450)
UIBC: 253 ug/dL

## 2020-09-07 LAB — FERRITIN: Ferritin: 37 ng/mL (ref 11–307)

## 2020-09-11 ENCOUNTER — Telehealth: Payer: Self-pay | Admitting: Oncology

## 2020-09-11 NOTE — Telephone Encounter (Signed)
Please advise on 09/07/20 lab results.  Patient is scheudled for 8/31 lab with MD-Yu/Venofer on 9/1 Patient las saw Dr. Mike Gip on 05/03/20 with check out:    RTC in 3 months for labs (CBC with diff, ferritin, iron studies, sed rate).  RTC in 6 months for MD assess, labs (CBC with diff, ferritin, iron studies- daybefore) and +/- Venofer.

## 2020-09-11 NOTE — Telephone Encounter (Signed)
Patient called requesting a call back to discuss her lab results from last week.  Previous [facility] patient, scheduled to see Dr. Tasia Catchings in September.  Routing to clinical team for followup.

## 2020-09-13 NOTE — Telephone Encounter (Signed)
Dr. Yu please advise  

## 2020-09-14 NOTE — Telephone Encounter (Signed)
Pt informed of MD recommendation.  

## 2020-11-01 ENCOUNTER — Inpatient Hospital Stay: Payer: Medicare Other

## 2020-11-02 ENCOUNTER — Ambulatory Visit: Payer: Medicare Other

## 2020-11-02 ENCOUNTER — Ambulatory Visit: Payer: Medicare Other | Admitting: Oncology

## 2020-11-15 ENCOUNTER — Other Ambulatory Visit: Payer: Self-pay

## 2020-11-15 ENCOUNTER — Inpatient Hospital Stay: Payer: Medicare Other | Attending: Oncology

## 2020-11-15 DIAGNOSIS — D508 Other iron deficiency anemias: Secondary | ICD-10-CM | POA: Diagnosis not present

## 2020-11-15 DIAGNOSIS — C7A012 Malignant carcinoid tumor of the ileum: Secondary | ICD-10-CM

## 2020-11-15 LAB — CBC WITH DIFFERENTIAL/PLATELET
Abs Immature Granulocytes: 0.03 10*3/uL (ref 0.00–0.07)
Basophils Absolute: 0 10*3/uL (ref 0.0–0.1)
Basophils Relative: 0 %
Eosinophils Absolute: 0.1 10*3/uL (ref 0.0–0.5)
Eosinophils Relative: 1 %
HCT: 33 % — ABNORMAL LOW (ref 36.0–46.0)
Hemoglobin: 10.8 g/dL — ABNORMAL LOW (ref 12.0–15.0)
Immature Granulocytes: 0 %
Lymphocytes Relative: 26 %
Lymphs Abs: 2.4 10*3/uL (ref 0.7–4.0)
MCH: 30.6 pg (ref 26.0–34.0)
MCHC: 32.7 g/dL (ref 30.0–36.0)
MCV: 93.5 fL (ref 80.0–100.0)
Monocytes Absolute: 1.1 10*3/uL — ABNORMAL HIGH (ref 0.1–1.0)
Monocytes Relative: 12 %
Neutro Abs: 5.8 10*3/uL (ref 1.7–7.7)
Neutrophils Relative %: 61 %
Platelets: 236 10*3/uL (ref 150–400)
RBC: 3.53 MIL/uL — ABNORMAL LOW (ref 3.87–5.11)
RDW: 13.3 % (ref 11.5–15.5)
WBC: 9.5 10*3/uL (ref 4.0–10.5)
nRBC: 0 % (ref 0.0–0.2)

## 2020-11-15 LAB — IRON AND TIBC
Iron: 51 ug/dL (ref 28–170)
Saturation Ratios: 15 % (ref 10.4–31.8)
TIBC: 349 ug/dL (ref 250–450)
UIBC: 298 ug/dL

## 2020-11-15 LAB — FERRITIN: Ferritin: 22 ng/mL (ref 11–307)

## 2020-11-16 ENCOUNTER — Ambulatory Visit: Payer: Medicare Other

## 2020-11-16 ENCOUNTER — Encounter: Payer: Self-pay | Admitting: Oncology

## 2020-11-16 ENCOUNTER — Inpatient Hospital Stay (HOSPITAL_BASED_OUTPATIENT_CLINIC_OR_DEPARTMENT_OTHER): Payer: Medicare Other | Admitting: Oncology

## 2020-11-16 DIAGNOSIS — D508 Other iron deficiency anemias: Secondary | ICD-10-CM

## 2020-11-16 DIAGNOSIS — C7A012 Malignant carcinoid tumor of the ileum: Secondary | ICD-10-CM

## 2020-11-16 DIAGNOSIS — E538 Deficiency of other specified B group vitamins: Secondary | ICD-10-CM | POA: Diagnosis not present

## 2020-11-16 DIAGNOSIS — Z86711 Personal history of pulmonary embolism: Secondary | ICD-10-CM | POA: Diagnosis not present

## 2020-11-16 NOTE — Progress Notes (Signed)
Hematology and Oncology Progress Note   Telephone Visit:  11/16/2020  Referring physician: Derinda Late, MD  I connected with Lauren Mccann on 05/03/2020 at 8:47 PM by telephone conferencing and verified that I was speaking with the correct person using 2 identifiers. The patient was at home.  I discussed the limitations, risk, security and privacy concerns of performing an evaluation and management service by telephone conferencing and the availability of in person appointments.  I also discussed with the patient that there may be a patient responsible charge related to this service.  The patient expressed understanding and agreed to proceed.   Chief Complaint: Lauren Mccann is a 87 y.o. female with a history of a neuroendocrine tumor s/p right hemicolectomy, chronic diarrhea, and pulmonary emboli who presents for 6 months assessment.  PERTINENT ONCOLOGY HISTORY  11/06/2011. extensive bilateral pulmonary emboli  Patient has history of iron deficiency anemia.  Chronic diarrhea and history of pulmonary emboli on anticoagulation. Patient previously followed up by Dr.Corcoran, patient switched care to me on 11/16/20 Extensive medical record review was performed by me.  10/29/2012. Neuroendocrine tumor s/p right hemicolectomy.   Pathology revealed a 2.0 cm grade I neuroendocrine tumor arising in the distal ileum.  Tumor extended through the muscularis propria into the adjacent adipose tissue.  Margins were negative.  There was lymph-vascular invasion.  Five of 14 lymph nodes were positive.  Pathologic stage was T3N1.  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  She has a history of bilateral breast cancer.  She right breast cancer 20+ years ago. She underwent lumpectomy.  Left breast cancer was diagnosed 7-8 years ago. She never received radiation or chemotherapy. No details are available.  Independently manages personal grooming and feeding. She  needs assistance with shopping, driving, cooking, and household chores   INTERVAL HISTORY Patient was offered an in person appointment to establish care.  Patient called and prefers to change to telephone visit.  She has no access for virtual visits. Patient reports feeling extremely tired and fatigued.  Appetite is fair.  She lives by herself.  She denies seeing any blood in her stool. Her granddaughter is too busy to bring her to doctor's appointment.  Her neighbor gave her a ride to get blood work done.  Past Medical History:  Diagnosis Date   A-fib ([place])    Anemia    Anxiety    Arthritis    neck, back, knees   Breast cancer ([place]) 03/11/2014   Overview:  S/p right mastectomy.  Reoccurrence s/p left matstectomy.   Cancer [facility])    breast   Closed fracture of neck of left femur with routine healing 08/07/2014   Depression    Dyspnea    easily   Fibromyalgia    GERD (gastroesophageal reflux disease)    Headache    everyday   Hip fracture requiring operative repair ([place]) 07/04/2014   History of neck problems    shots in neck every three months   Hypertension    Hyperthyroidism    IBS (irritable bowel syndrome)    Neuropathy    Osteoporosis    Pulmonary embolism ([place])    H/O   Stroke ([place])    in past no residual effects   Thyroid disease    Tremor of both hands    Vertigo    worse in past    Past Surgical History:  Procedure Laterality Date   ABDOMINAL HYSTERECTOMY     CATARACT  EXTRACTION     CATARACT EXTRACTION W/PHACO Right 08/18/2017   Procedure: CATARACT EXTRACTION PHACO AND INTRAOCULAR LENS PLACEMENT ([place]) right;  Surgeon: Eulogio Bear, MD;  Location: [facility];  Service: Ophthalmology;  Laterality: Right;   CHOLECYSTECTOMY     COLON SURGERY     resection   HIP PINNING,CANNULATED Left 07/03/2014   Procedure: CANNULATED HIP PINNING;  Surgeon: Claud Kelp, MD;  Location: [facility];  Service: Orthopedics;  Laterality: Left;   MASTECTOMY Bilateral     VENA CAVA FILTER PLACEMENT      Family History  Problem Relation Age of Onset   Cancer Father        Bone   Kidney cancer Neg Hx    Prostate cancer Neg Hx    Bladder Cancer Neg Hx     Social History:  reports that she has never smoked. She has never used smokeless tobacco. She reports that she does not drink alcohol and does not use drugs. Two sons died (one died at the age of 44 with AIDS another son died at age 53 of melanoma). She lives at the [address] apartment complex in [place].  She lives alone.  Her friend Joycelyn Schmid lives upstairs.  The patient is alone today.   Allergies:  Allergies  Allergen Reactions   Codeine Shortness Of Breath and Other (See Comments)    GI Upset  Pt states "makes heart flutter and trouble breathing" Other reaction(s): Other (See Comments) Chest Tightness, SOB, Tachycardia GI Upset  Pt states "makes heart flutter and trouble breathing" Other reaction(s): Other (See Comments) Chest Tightness, SOB, Tachycardia GI Upset  Pt states "makes heart flutter and trouble breathing"   Duloxetine Shortness Of Breath   Propoxyphene Shortness Of Breath    Other reaction(s): Other (See Comments) GI Upset   Alendronate Sodium     Other reaction(s): Other (See Comments) GI Upset   Atorvastatin     Other reaction(s): Other (See Comments) Myalgia   Benadryl [Diphenhydramine Hcl] Other (See Comments)    Restless legs   Cefuroxime Axetil Nausea Only   Cymbalta [Duloxetine Hcl]    Diphenhydramine    Lipitor [Atorvastatin Calcium]    Other     Other reaction(s): Other (See Comments) GI Upset   Prednisone     Other reaction(s): Other (See Comments) Chest Pain, SOB, "can't breathe good" Unable to sleep   Levofloxacin Rash and Other (See Comments)    Altered mental status    Current Medications: Current Outpatient Medications  Medication Sig Dispense Refill   acetaminophen (TYLENOL) 325 MG tablet Take 650 mg by mouth every 4 (four) hours as needed for  mild pain or fever.      ALPRAZolam (XANAX) 1 MG tablet Take 0.5 mg by mouth 2 (two) times daily as needed for anxiety.      ferrous sulfate 325 (65 FE) MG tablet Take 1 tablet (325 mg total) by mouth in the morning, at noon, and at bedtime. 90 tablet 3   levothyroxine (SYNTHROID, LEVOTHROID) 25 MCG tablet Take 25 mcg by mouth daily.      loperamide (IMODIUM) 2 MG capsule Take 2-4 mg by mouth 2 (two) times daily as needed for diarrhea or loose stools.     metoprolol succinate (TOPROL-XL) 50 MG 24 hr tablet Take 50 mg by mouth daily.     nortriptyline (PAMELOR) 50 MG capsule Take by mouth.     oxybutynin (DITROPAN-XL) 10 MG 24 hr tablet Take 1 tablet by mouth daily.  pantoprazole (PROTONIX) 20 MG tablet Take 20 mg by mouth daily.     rivaroxaban (XARELTO) 20 MG TABS tablet Take 1 tablet (20 mg total) by mouth daily. 30 tablet 1   sertraline (ZOLOFT) 100 MG tablet Take 150 mg by mouth daily.      vitamin B-12 1000 MCG tablet Take 1 tablet (1,000 mcg total) by mouth daily. 90 tablet 0   No current facility-administered medications for this visit.    Review of Systems  Constitutional:  Positive for malaise/fatigue. Negative for chills, diaphoresis, fever and weight loss (stable per patient).  HENT:  Negative for congestion, ear discharge, ear pain, hearing loss, nosebleeds, sinus pain, sore throat and tinnitus.   Eyes:  Negative for blurred vision.  Respiratory:  Positive for shortness of breath (minimal; exertional). Negative for cough, hemoptysis and sputum production.   Cardiovascular:  Negative for chest pain, palpitations and leg swelling.  Gastrointestinal:  Positive for diarrhea (improved) and melena (on oral iron). Negative for abdominal pain, blood in stool, constipation, heartburn, nausea and vomiting.       Eating better. Drinking ensure and boost. Picky eater.  Genitourinary:  Negative for dysuria, frequency, hematuria and urgency.  Musculoskeletal:  Positive for back pain (lower  back). Negative for joint pain, myalgias and neck pain.       Fibromyalgia.  Skin:  Negative for itching and rash.  Neurological:  Negative for dizziness, tingling, sensory change, weakness and headaches.       Having trouble focusing and thinking straight  Endo/Heme/Allergies:  Does not bruise/bleed easily.  Psychiatric/Behavioral:  Negative for depression and memory loss. The patient is not nervous/anxious and does not have insomnia.   All other systems reviewed and are negative.  Performance status (ECOG): 2  Vital Signs There were no vitals taken for this visit.   Appointment on 11/15/2020  Component Date Value Ref Range Status   Iron 11/15/2020 51  28 - 170 ug/dL Final   TIBC 11/15/2020 349  250 - 450 ug/dL Final   Saturation Ratios 11/15/2020 15  10.4 - 31.8 % Final   UIBC 11/15/2020 298  ug/dL Final   Performed at [facility], [address] [place], [place] [address]   Ferritin 11/15/2020 22  11 - 307 ng/mL Final   Performed at [facility], [address] [place], [place] [address]   WBC 11/15/2020 9.5  4.0 - 10.5 K/uL Final   RBC 11/15/2020 3.53 (A) 3.87 - 5.11 MIL/uL Final   Hemoglobin 11/15/2020 10.8 (A) 12.0 - 15.0 g/dL Final   HCT 11/15/2020 33.0 (A) 36.0 - 46.0 % Final   MCV 11/15/2020 93.5  80.0 - 100.0 fL Final   MCH 11/15/2020 30.6  26.0 - 34.0 pg Final   MCHC 11/15/2020 32.7  30.0 - 36.0 g/dL Final   RDW 11/15/2020 13.3  11.5 - 15.5 % Final   Platelets 11/15/2020 236  150 - 400 K/uL Final   nRBC 11/15/2020 0.0  0.0 - 0.2 % Final   Neutrophils Relative % 11/15/2020 61  % Final   Neutro Abs 11/15/2020 5.8  1.7 - 7.7 K/uL Final   Lymphocytes Relative 11/15/2020 26  % Final   Lymphs Abs 11/15/2020 2.4  0.7 - 4.0 K/uL Final   Monocytes Relative 11/15/2020 12  % Final   Monocytes Absolute 11/15/2020 1.1 (A) 0.1 - 1.0 K/uL Final   Eosinophils Relative 11/15/2020 1  % Final   Eosinophils Absolute 11/15/2020 0.1  0.0 - 0.5 K/uL Final  Basophils Relative 11/15/2020 0  % Final   Basophils Absolute 11/15/2020 0.0  0.0 - 0.1 K/uL Final   Immature Granulocytes 11/15/2020 0  % Final   Abs Immature Granulocytes 11/15/2020 0.03  0.00 - 0.07 K/uL Final   Performed at [facility], [address], [place], [place] [address]    Assessment and Plan.   1. Other iron deficiency anemia   2. History of pulmonary embolism   3. 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 Labs reviewed and discussed with patient.  Hemoglobin has slightly decreased to 10.8 Iron panel shows iron saturation of 15, ferritin 22. I discussed with patient that it is not an ideal situation for her to establish care with me via telephone visit.  I recommend patient to come for an in person visit for further evaluation. In the setting of chronic kidney disease, I recommend to further improve iron store and keep ferritin above 100-200. Patient has previously received Feraheme and reports feeling better after iron treatments. We will plan to arrange IV Feraheme treatment in the next 1 to 2 weeks.  I will also see her for in person evaluation. Discussed about the possibility of switching to IV Venofer if insurance does not cover Feraheme currently.  She agrees with the plan.    # B12 deficiency.  Continue oral B12. Check B12 at next visit.   # History of bilateral pulmonary embolism, IVC filter.  Currently on Xarelto '20mg'$  daily, not managed by HemOnc. Recommend close monitoring kidney function and consider dose decrease to '15mg'$  daily or switch to Eliquis '5mg'$  BID  # History of neuroendocrine tumor s/p hemicolectomy RTC  1-2 weeks, MD + venofer.    I discussed the assessment and treatment plan with the patient.  The patient was provided an opportunity to ask questions and all were answered.  The patient agreed with the plan and demonstrated an understanding of the  instructions.  The patient was advised to call back if the symptoms worsen or if the condition fails to improve as anticipated.  I provided 25  minutes of non face-to-face telephone visit time during this this encounter and > 50% was spent counseling as documented under my assessment and plan.  I provided these services from the [facility] office.  Earlie Server, MD, PhD Hematology Oncology [place] at [facility] 11/16/2020

## 2020-11-28 ENCOUNTER — Inpatient Hospital Stay: Payer: Medicare Other

## 2020-11-28 ENCOUNTER — Inpatient Hospital Stay (HOSPITAL_BASED_OUTPATIENT_CLINIC_OR_DEPARTMENT_OTHER): Payer: Medicare Other | Admitting: Oncology

## 2020-11-28 ENCOUNTER — Encounter: Payer: Self-pay | Admitting: Oncology

## 2020-11-28 VITALS — BP 106/68 | HR 96 | Temp 98.7°F | Resp 22

## 2020-11-28 DIAGNOSIS — Z86711 Personal history of pulmonary embolism: Secondary | ICD-10-CM

## 2020-11-28 DIAGNOSIS — D3A8 Other benign neuroendocrine tumors: Secondary | ICD-10-CM | POA: Diagnosis not present

## 2020-11-28 DIAGNOSIS — E538 Deficiency of other specified B group vitamins: Secondary | ICD-10-CM

## 2020-11-28 DIAGNOSIS — D508 Other iron deficiency anemias: Secondary | ICD-10-CM

## 2020-11-28 MED ORDER — SODIUM CHLORIDE 0.9 % IV SOLN
Freq: Once | INTRAVENOUS | Status: AC
  Filled 2020-11-28: qty 250

## 2020-11-28 MED ORDER — SODIUM CHLORIDE 0.9 % IV SOLN
510.0000 mg | Freq: Once | INTRAVENOUS | Status: AC
  Administered 2020-11-28: 510 mg via INTRAVENOUS
  Filled 2020-11-28: qty 510

## 2020-11-28 NOTE — Progress Notes (Signed)
Hematology/Oncology Follow Up Note [facility] ([place])  [phone] [phone]  Patient Care Team: Derinda Late, MD as PCP - General (Family Medicine) Manya Silvas, MD (Inactive) (Gastroenterology) Rodney Booze as Physician Assistant Earlie Server, MD as Consulting Physician (Oncology)   Name of the patient: Cylah Fannin  [id number]  [birth year]   REASON FOR VISIT Follow-up for anemia   INTERVAL HISTORY Breianna L Hinostroza is a 87 y.o. female who has above history reviewed by me today presents for follow up visit for management of anemia. Problems and complaints are listed below: Patient reports having lightheadedness after standing up.  This triggers headache, warm sensation feeling, down from head to chest.  She feels weak.  She lives by herself.  Today her granddaughter brought her to the visit. Denies any black or bloody stool.  Review of Systems  Constitutional:  Positive for fatigue. Negative for appetite change, chills and fever.  HENT:   Negative for hearing loss and voice change.   Eyes:  Negative for eye problems.  Respiratory:  Negative for chest tightness and cough.   Cardiovascular:  Negative for chest pain.  Gastrointestinal:  Negative for abdominal distention, abdominal pain and blood in stool.  Endocrine: Negative for hot flashes.  Genitourinary:  Negative for difficulty urinating and frequency.   Musculoskeletal:  Negative for arthralgias.  Skin:  Negative for itching and rash.  Neurological:  Positive for headaches and light-headedness. Negative for extremity weakness.  Hematological:  Negative for adenopathy.  Psychiatric/Behavioral:  Negative for confusion.      Allergies  Allergen Reactions   Codeine Shortness Of Breath and Other (See Comments)    GI Upset  Pt states "makes heart flutter and trouble breathing" Other reaction(s): Other (See Comments) Chest Tightness, SOB, Tachycardia GI Upset  Pt  states "makes heart flutter and trouble breathing" Other reaction(s): Other (See Comments) Chest Tightness, SOB, Tachycardia GI Upset  Pt states "makes heart flutter and trouble breathing"   Duloxetine Shortness Of Breath   Propoxyphene Shortness Of Breath    Other reaction(s): Other (See Comments) GI Upset   Alendronate Sodium     Other reaction(s): Other (See Comments) GI Upset   Atorvastatin     Other reaction(s): Other (See Comments) Myalgia   Benadryl [Diphenhydramine Hcl] Other (See Comments)    Restless legs   Cefuroxime Axetil Nausea Only   Cymbalta [Duloxetine Hcl]    Diphenhydramine    Lipitor [Atorvastatin Calcium]    Other     Other reaction(s): Other (See Comments) GI Upset   Prednisone     Other reaction(s): Other (See Comments) Chest Pain, SOB, "can't breathe good" Unable to sleep   Levofloxacin Rash and Other (See Comments)    Altered mental status     Past Medical History:  Diagnosis Date   A-fib ([place])    Anemia    Anxiety    Arthritis    neck, back, knees   Breast cancer ([place]) 03/11/2014   Overview:  S/p right mastectomy.  Reoccurrence s/p left matstectomy.   Cancer [facility])    breast   Closed fracture of neck of left femur with routine healing 08/07/2014   Depression    Dyspnea    easily   Fibromyalgia    GERD (gastroesophageal reflux disease)    Headache    everyday   Hip fracture requiring operative repair ([place]) 07/04/2014   History of neck problems    shots in neck every three months  Hypertension    Hyperthyroidism    IBS (irritable bowel syndrome)    Neuropathy    Osteoporosis    Pulmonary embolism ([place])    H/O   Stroke [facility])    in past no residual effects   Thyroid disease    Tremor of both hands    Vertigo    worse in past     Past Surgical History:  Procedure Laterality Date   ABDOMINAL HYSTERECTOMY     CATARACT EXTRACTION     CATARACT EXTRACTION W/PHACO Right 08/18/2017   Procedure: CATARACT EXTRACTION PHACO AND  INTRAOCULAR LENS PLACEMENT ([place]) right;  Surgeon: Eulogio Bear, MD;  Location: [facility];  Service: Ophthalmology;  Laterality: Right;   CHOLECYSTECTOMY     COLON SURGERY     resection   HIP PINNING,CANNULATED Left 07/03/2014   Procedure: CANNULATED HIP PINNING;  Surgeon: Claud Kelp, MD;  Location: [facility];  Service: Orthopedics;  Laterality: Left;   MASTECTOMY Bilateral    VENA CAVA FILTER PLACEMENT      Social History   Socioeconomic History   Marital status: Single    Spouse name: Not on file   Number of children: Not on file   Years of education: Not on file   Highest education level: Not on file  Occupational History   Not on file  Tobacco Use   Smoking status: Never   Smokeless tobacco: Never  Vaping Use   Vaping Use: Not on file  Substance and Sexual Activity   Alcohol use: No   Drug use: No   Sexual activity: Not on file  Other Topics Concern   Not on file  Social History Narrative   Not on file   Social Determinants of Health   Financial Resource Strain: Not on file  Food Insecurity: Not on file  Transportation Needs: Not on file  Physical Activity: Not on file  Stress: Not on file  Social Connections: Not on file  Intimate Partner Violence: Not on file    Family History  Problem Relation Age of Onset   Cancer Father        Bone   Kidney cancer Neg Hx    Prostate cancer Neg Hx    Bladder Cancer Neg Hx      Current Outpatient Medications:    acetaminophen (TYLENOL) 325 MG tablet, Take 650 mg by mouth every 4 (four) hours as needed for mild pain or fever. , Disp: , Rfl:    ALPRAZolam (XANAX) 1 MG tablet, Take 0.5 mg by mouth 2 (two) times daily as needed for anxiety. , Disp: , Rfl:    ferrous sulfate 325 (65 FE) MG tablet, Take 1 tablet (325 mg total) by mouth in the morning, at noon, and at bedtime., Disp: 90 tablet, Rfl: 3   levothyroxine (SYNTHROID, LEVOTHROID) 25 MCG tablet, Take 25 mcg by mouth daily. , Disp: , Rfl:     loperamide (IMODIUM) 2 MG capsule, Take 2-4 mg by mouth 2 (two) times daily as needed for diarrhea or loose stools., Disp: , Rfl:    metoprolol succinate (TOPROL-XL) 50 MG 24 hr tablet, Take 50 mg by mouth daily., Disp: , Rfl:    nortriptyline (PAMELOR) 50 MG capsule, Take by mouth., Disp: , Rfl:    oxybutynin (DITROPAN-XL) 10 MG 24 hr tablet, Take 1 tablet by mouth daily., Disp: , Rfl:    pantoprazole (PROTONIX) 20 MG tablet, Take 20 mg by mouth daily., Disp: , Rfl:    rivaroxaban (  XARELTO) 20 MG TABS tablet, Take 1 tablet (20 mg total) by mouth daily., Disp: 30 tablet, Rfl: 1   sertraline (ZOLOFT) 100 MG tablet, Take 150 mg by mouth daily. , Disp: , Rfl:    vitamin B-12 1000 MCG tablet, Take 1 tablet (1,000 mcg total) by mouth daily., Disp: 90 tablet, Rfl: 0  Physical exam:  Vitals:   11/28/20 1315  BP: 106/68  Pulse: 96  Resp: (!) 22  Temp: 98.7 F (37.1 C)   Physical Exam Constitutional:      General: She is not in acute distress.    Appearance: She is obese.     Comments: Frail appearance female sitting in chair.  HENT:     Head: Normocephalic and atraumatic.  Eyes:     General: No scleral icterus. Cardiovascular:     Rate and Rhythm: Normal rate and regular rhythm.     Heart sounds: Normal heart sounds.  Pulmonary:     Effort: Pulmonary effort is normal. No respiratory distress.     Breath sounds: No wheezing.  Abdominal:     General: Bowel sounds are normal. There is no distension.     Palpations: Abdomen is soft.  Musculoskeletal:        General: No deformity. Normal range of motion.     Cervical back: Normal range of motion and neck supple.  Skin:    General: Skin is warm and dry.     Coloration: Skin is pale.     Findings: No erythema or rash.  Neurological:     Mental Status: She is alert and oriented to person, place, and time. Mental status is at baseline.     Cranial Nerves: No cranial nerve deficit.     Coordination: Coordination normal.  Psychiatric:         Mood and Affect: Mood normal.    CMP Latest Ref Rng & Units 04/30/2019  Glucose 70 - 99 mg/dL 90  BUN 8 - 23 mg/dL 13  Creatinine 0.44 - 1.00 mg/dL 1.08(H)  Sodium 135 - 145 mmol/L 139  Potassium 3.5 - 5.1 mmol/L 4.7  Chloride 98 - 111 mmol/L 110  CO2 22 - 32 mmol/L 22  Calcium 8.9 - 10.3 mg/dL 8.7(L)  Total Protein 6.5 - 8.1 g/dL -  Total Bilirubin 0.3 - 1.2 mg/dL -  Alkaline Phos 38 - 126 U/L -  AST 15 - 41 U/L -  ALT 0 - 44 U/L -   CBC Latest Ref Rng & Units 11/15/2020  WBC 4.0 - 10.5 K/uL 9.5  Hemoglobin 12.0 - 15.0 g/dL 10.8(L)  Hematocrit 36.0 - 46.0 % 33.0(L)  Platelets 150 - 400 K/uL 236    RADIOGRAPHIC STUDIES: I have personally reviewed the radiological images as listed and agreed with the findings in the report. No results found.   Assessment and plan  1. Other iron deficiency anemia   2. Neuroendocrine tumor   3. B12 deficiency   4. History of pulmonary embolism    #Iron deficiency anemia in the context of CKD.  Labs reviewed and discussed with patient. Recommend IV Feraheme treatment today. I also recommend patient to repeat another IV Feraheme treatment in the next 2 to 4 weeks.  Patient is not sure if she is able to make it.  Prefers to defer repeating Feraheme treatment  #Remote history of neuroendocrine tumor- 2014.   She received Sandostatin LAR 30 mg every 4 weeks for about 2 years. Last CT scan was in 2021.  Patient  is more than 5 years after her diagnosis.    #History of vitamin B12 deficiency.  We will check B12 the next visit. #History of DVT, patient is on Xarelto 20 mg daily.  Patient's recent GFR was 39.  Recommend to further decrease Xarelto to 15 mg daily or switch to Eliquis.  Xarelto is not currently managed by me.  We will communicate with her PCP.  Orders Placed This Encounter  Procedures   CBC with Differential/Platelet    Standing Status:   Future    Standing Expiration Date:   11/28/2021   Ferritin    Standing Status:   Future     Standing Expiration Date:   11/28/2021   Iron and TIBC    Standing Status:   Future    Standing Expiration Date:   11/28/2021        Earlie Server, MD, PhD  11/28/2020

## 2020-11-28 NOTE — Progress Notes (Signed)
Pt here for follow up. Pt reports that she has been having headaches for the past 4 months. She is fine as long as she is siting.

## 2020-11-28 NOTE — Patient Instructions (Addendum)
[facility]  Discharge Instructions: Thank you for choosing [facility] to provide your oncology and hematology care.  If you have a lab appointment with the Cancer Center, please go directly to the Cancer Center and check in at the registration area.  Wear comfortable clothing and clothing appropriate for easy access to any Portacath or PICC line.   We strive to give you quality time with your provider. You may need to reschedule your appointment if you arrive late (15 or more minutes).  Arriving late affects you and other patients whose appointments are after yours.  Also, if you miss three or more appointments without notifying the office, you may be dismissed from the clinic at the provider's discretion.      For prescription refill requests, have your pharmacy contact our office and allow 72 hours for refills to be completed.    Today you received the following chemotherapy and/or immunotherapy agents FERRAHEME      To help prevent nausea and vomiting after your treatment, we encourage you to take your nausea medication as directed.  BELOW ARE SYMPTOMS THAT SHOULD BE REPORTED IMMEDIATELY: *FEVER GREATER THAN 100.4 F (38 C) OR HIGHER *CHILLS OR SWEATING *NAUSEA AND VOMITING THAT IS NOT CONTROLLED WITH YOUR NAUSEA MEDICATION *UNUSUAL SHORTNESS OF BREATH *UNUSUAL BRUISING OR BLEEDING *URINARY PROBLEMS (pain or burning when urinating, or frequent urination) *BOWEL PROBLEMS (unusual diarrhea, constipation, pain near the anus) TENDERNESS IN MOUTH AND THROAT WITH OR WITHOUT PRESENCE OF ULCERS (sore throat, sores in mouth, or a toothache) UNUSUAL RASH, SWELLING OR PAIN  UNUSUAL VAGINAL DISCHARGE OR ITCHING   Items with * indicate a potential emergency and should be followed up as soon as possible or go to the Emergency Department if any problems should occur.  Please show the CHEMOTHERAPY ALERT CARD or IMMUNOTHERAPY ALERT CARD at check-in  to the Emergency Department and triage nurse.  Should you have questions after your visit or need to cancel or reschedule your appointment, please contact [facility]  [phone] and follow the prompts.  Office hours are 8:00 a.m. to 4:30 p.m. Monday - Friday. Please note that voicemails left after 4:00 p.m. may not be returned until the following business day.  We are closed weekends and major holidays. You have access to a nurse at all times for urgent questions. Please call the main number to the clinic [phone] and follow the prompts.  For any non-urgent questions, you may also contact your provider using MyChart. We now offer e-Visits for anyone 18 and older to request care online for non-urgent symptoms. For details visit [email or web address].   Also download the MyChart app! Go to the app store, search "MyChart", open the app, select [facility], and log in with your MyChart username and password.  Due to Covid, a mask is required upon entering the hospital/clinic. If you do not have a mask, one will be given to you upon arrival. For doctor visits, patients may have 1 support person aged 18 or older with them. For treatment visits, patients cannot have anyone with them due to current Covid guidelines and our immunocompromised population.   Iron Deficiency Anemia, Adult Iron deficiency anemia is a condition in which the concentration of red blood cells or hemoglobin in the blood is below normal because of too little iron. Hemoglobin is a substance in red blood cells that carries oxygen to the body's tissues. When the concentration of red blood cells or hemoglobin   is too low, not enough oxygen reaches these tissues. Iron deficiency anemia is usually long-lasting, and it develops over time. It may or may not cause symptoms. It is a common type of anemia. What are the causes? This condition may be caused by: Not enough iron in the diet. Abnormal  absorption in the gut. Increased need for iron because of pregnancy or heavy menstrual periods, for females. Cancers of the gastrointestinal system, such as colon cancer. Blood loss caused by bleeding in the intestine. This may be from a gastrointestinal condition like Crohn's disease. Frequent blood draws, such as from blood donation. What increases the risk? The following factors may make you more likely to develop this condition: Being pregnant. Being a teenage girl going through a growth spurt. What are the signs or symptoms? Symptoms of this condition may include: Pale skin, lips, and nail beds. Weakness, dizziness, and getting tired easily. Headache. Shortness of breath when moving or exercising. Cold hands and feet. Fast or irregular heartbeat. Irritability or rapid breathing. These are more common in severe anemia. Mild anemia may not cause any symptoms. How is this diagnosed? This condition is diagnosed based on: Your medical history. A physical exam. Blood tests. You may have additional tests to find the underlying cause of your anemia, such as: Testing for blood in the stool (fecal occult blood test). A procedure to see inside your colon and rectum (colonoscopy). A procedure to see inside your esophagus and stomach (endoscopy). A test in which cells are removed from bone marrow (bone marrow aspiration) or fluid is removed from the bone marrow to be examined. This is rarely needed. How is this treated? This condition is treated by correcting the cause of your iron deficiency. Treatment may involve: Adding iron-rich foods to your diet. Taking iron supplements. If you are pregnant or breastfeeding, you may need to take extra iron because your normal diet usually does not provide the amount of iron that you need. Increasing vitamin C intake. Vitamin C helps your body absorb iron. Your health care provider may recommend that you take iron supplements along with a glass of  orange juice or a vitamin C supplement. Medicines to make heavy menstrual flow lighter. Surgery. You may need repeat blood tests to determine whether treatment is working. If the treatment does not seem to be working, you may need more tests. Follow these instructions at home: Medicines Take over-the-counter and prescription medicines only as told by your health care provider. This includes iron supplements and vitamins. For the best iron absorption, you should take iron supplements when your stomach is empty. If you cannot tolerate them on an empty stomach, you may need to take them with food. Do not drink milk or take antacids at the same time as your iron supplements. Milk and antacids may interfere with iron absorption. Iron supplements may turn stool (feces) a darker color and it may appear black. If you cannot tolerate taking iron supplements by mouth, talk with your health care provider about taking them through an IV or through an injection into a muscle. Eating and drinking  Talk with your health care provider before changing your diet. He or she may recommend that you eat foods that contain a lot of iron, such as: Liver. Low-fat (lean) beef. Breads and cereals that have iron added to them (are fortified). Eggs. Dried fruit. Dark green, leafy vegetables. To help your body use the iron from iron-rich foods, eat those foods at the same time as fresh fruits   and vegetables that are high in vitamin C. Foods that are high in vitamin C include: Oranges. Peppers. Tomatoes. Mangoes. Drink enough fluid to keep your urine pale yellow. Managing constipation If you are taking an iron supplement, it may cause constipation. To prevent or treat constipation, you may need to: Take over-the-counter or prescription medicines. Eat foods that are high in fiber, such as beans, whole grains, and fresh fruits and vegetables. Limit foods that are high in fat and processed sugars, such as fried or sweet  foods. General instructions Return to your normal activities as told by your health care provider. Ask your health care provider what activities are safe for you. Practice good hygiene. Anemia can make you more prone to illness and infection. Keep all follow-up visits as told by your health care provider. This is important. Contact a health care provider if you: Feel nauseous or you vomit. Feel weak. Have unexplained sweating. Develop symptoms of constipation, such as: Having fewer than three bowel movements a week. Straining to have a bowel movement. Having stools that are hard, dry, or larger than normal. Feeling full or bloated. Pain in the lower abdomen. Not feeling relief after having a bowel movement. Get help right away if you: Faint. If this happens, do not drive yourself to the hospital. Have chest pain. Have shortness of breath that: Is severe. Gets worse with physical activity. Have an irregular or rapid heartbeat. Become light-headed when getting up from a sitting or lying down position. These symptoms may represent a serious problem that is an emergency. Do not wait to see if the symptoms will go away. Get medical help right away. Call your local emergency services (911 in the [place] Do not drive yourself to the hospital. Summary Iron deficiency anemia is a condition in which the concentration of red blood cells or hemoglobin in the blood is below normal because of too little iron. This condition is treated by correcting the cause of your iron deficiency. Take over-the-counter and prescription medicines only as told by your health care provider. This includes iron supplements and vitamins. To help your body use the iron from iron-rich foods, eat those foods at the same time as fresh fruits and vegetables that are high in vitamin C. Get help right away if you have shortness of breath that gets worse with physical activity. This information is not intended to replace advice  given to you by your health care provider. Make sure you discuss any questions you have with your health care provider. Document Revised: 10/27/2018 Document Reviewed: 10/27/2018 Elsevier Patient Education  2022 [employer]vier Inc.  

## 2020-11-29 ENCOUNTER — Telehealth: Payer: Self-pay

## 2020-11-29 NOTE — Telephone Encounter (Signed)
Note faxed to PCP.

## 2020-11-29 NOTE — Telephone Encounter (Signed)
-----   Message from Earlie Server, MD sent at 11/28/2020 10:38 PM EDT ----- Please fax my note to her PCP Dr.Baboff, and recommend xarelto to be decreased to 15mg  daily due to her kidney function.

## 2020-11-30 ENCOUNTER — Ambulatory Visit: Payer: Medicare Other | Admitting: Oncology

## 2020-11-30 ENCOUNTER — Ambulatory Visit: Payer: Medicare Other

## 2020-12-12 ENCOUNTER — Ambulatory Visit: Payer: Medicare Other

## 2020-12-12 ENCOUNTER — Ambulatory Visit: Payer: Medicare Other | Admitting: Oncology

## 2021-05-21 ENCOUNTER — Other Ambulatory Visit: Payer: Self-pay | Admitting: *Deleted

## 2021-05-21 DIAGNOSIS — D508 Other iron deficiency anemias: Secondary | ICD-10-CM

## 2021-05-22 ENCOUNTER — Other Ambulatory Visit: Payer: Self-pay | Admitting: Oncology

## 2021-05-28 ENCOUNTER — Inpatient Hospital Stay: Payer: Medicare Other | Attending: Oncology

## 2021-05-30 ENCOUNTER — Inpatient Hospital Stay: Payer: Medicare Other | Admitting: Oncology

## 2021-05-30 ENCOUNTER — Inpatient Hospital Stay: Payer: Medicare Other

## 2022-05-29 ENCOUNTER — Encounter: Payer: Self-pay | Admitting: Oncology

## 2022-07-01 ENCOUNTER — Ambulatory Visit: Payer: Medicare Other | Admitting: Urology

## 2022-08-05 ENCOUNTER — Ambulatory Visit: Payer: 59 | Admitting: Urology

## 2022-09-16 ENCOUNTER — Ambulatory Visit: Payer: 59 | Admitting: Urology

## 2022-09-16 ENCOUNTER — Encounter: Payer: Self-pay | Admitting: Urology

## 2022-09-16 VITALS — BP 105/70 | HR 120 | Ht 65.0 in | Wt 129.0 lb

## 2022-09-16 DIAGNOSIS — N3946 Mixed incontinence: Secondary | ICD-10-CM

## 2022-09-16 DIAGNOSIS — N3941 Urge incontinence: Secondary | ICD-10-CM

## 2022-09-16 LAB — URINALYSIS, COMPLETE
Bilirubin, UA: NEGATIVE
Glucose, UA: NEGATIVE
Ketones, UA: NEGATIVE
Nitrite, UA: NEGATIVE
Specific Gravity, UA: 1.015 (ref 1.005–1.030)
Urobilinogen, Ur: 0.2 mg/dL (ref 0.2–1.0)
pH, UA: 5.5 (ref 5.0–7.5)

## 2022-09-16 LAB — MICROSCOPIC EXAMINATION: WBC, UA: >30 /hpf — AB (ref 0–5)

## 2022-09-16 MED ORDER — GEMTESA 75 MG PO TABS: 75.0000 mg | ORAL_TABLET | Freq: Every day | ORAL | 11 refills | Status: DC

## 2022-09-16 NOTE — Progress Notes (Signed)
09/16/2022 2:33 PM   Lauren Mccann Sep 10, 1930 [id number]  Referring provider: Kandyce Rud, MD [address] [facility] [place],  [place] [address]  Chief Complaint  Patient presents with   Urinary Incontinence    HPI: Was consulted to assess the patient's urinary incontinence.  She is not a strong historian.  She has urge incontinence.  I am not convinced that she has stress incontinence.  She does have bedwetting.  I think she soaks 3 pads a day many pads at night  She voids every 1 or 2 hours depending on fluid intake.  She thinks she gets 6 infections in the last year but was nonspecific symptoms.  Possible burning.  Low back pain.  Symptoms sometimes respond to antibiotics  She has had neck surgery walks with a walker  Her friend said that she may have gotten diarrhea from sulfa and is often treated with either sulfa or Macrobid.  She has failed oxybutynin  No bladder surgery kidney stones  No recent urine cultures or x-rays in the medical record   PMH: Past Medical History:  Diagnosis Date   A-fib (HCC)    Anemia    Anxiety    Arthritis    neck, back, knees   Breast cancer (HCC) 03/11/2014   Overview:  S/p right mastectomy.  Reoccurrence s/p left matstectomy.   Cancer [facility])    breast   Closed fracture of neck of left femur with routine healing 08/07/2014   Depression    Dyspnea    easily   Fibromyalgia    GERD (gastroesophageal reflux disease)    Headache    everyday   Hip fracture requiring operative repair (HCC) 07/04/2014   History of neck problems    shots in neck every three months   Hypertension    Hyperthyroidism    IBS (irritable bowel syndrome)    Neuropathy    Osteoporosis    Pulmonary embolism (HCC)    H/O   Stroke (HCC)    in past no residual effects   Thyroid disease    Tremor of both hands    Vertigo    worse in past    Surgical History: Past Surgical History:  Procedure Laterality  Date   ABDOMINAL HYSTERECTOMY     CATARACT EXTRACTION     CATARACT EXTRACTION W/PHACO Right 08/18/2017   Procedure: CATARACT EXTRACTION PHACO AND INTRAOCULAR LENS PLACEMENT (IOC) right;  Surgeon: Nevada Crane, MD;  [facility];  Service: Ophthalmology;  Laterality: Right;   CHOLECYSTECTOMY     COLON SURGERY     resection   HIP PINNING,CANNULATED Left 07/03/2014   Procedure: CANNULATED HIP PINNING;  Surgeon: Danelle Earthly, MD;  Location: [facility];  Service: Orthopedics;  Laterality: Left;   MASTECTOMY Bilateral    VENA CAVA FILTER PLACEMENT      Home Medications:  Allergies as of 09/16/2022       Reactions   Codeine Shortness Of Breath, Other (See Comments)   GI Upset  Pt states "makes heart flutter and trouble breathing" Other reaction(s): Other (See Comments) Chest Tightness, SOB, Tachycardia GI Upset  Pt states "makes heart flutter and trouble breathing" Other reaction(s): Other (See Comments) Chest Tightness, SOB, Tachycardia GI Upset  Pt states "makes heart flutter and trouble breathing"   Duloxetine Shortness Of Breath   Propoxyphene Shortness Of Breath   Other reaction(s): Other (See Comments) GI Upset   Alendronate Sodium  Other reaction(s): Other (See Comments) GI Upset   Atorvastatin    Other reaction(s): Other (See Comments) Myalgia   Benadryl [diphenhydramine Hcl] Other (See Comments)   Restless legs   Cefuroxime Axetil Nausea Only   Cymbalta [duloxetine Hcl]    Diphenhydramine    Lipitor [atorvastatin Calcium]    Other    Other reaction(s): Other (See Comments) GI Upset   Prednisone    Other reaction(s): Other (See Comments) Chest Pain, SOB, "can't breathe good" Unable to sleep   Levofloxacin Rash, Other (See Comments)   Altered mental status        Medication List        Accurate as of September 16, 2022  2:33 PM. If you have any questions, ask your nurse or doctor.          acetaminophen 325 MG tablet Commonly known  as: TYLENOL Take 650 mg by mouth every 4 (four) hours as needed for mild pain or fever.   ALPRAZolam 1 MG tablet Commonly known as: XANAX Take 0.5 mg by mouth 2 (two) times daily as needed for anxiety.   cyanocobalamin 1000 MCG tablet Take 1 tablet (1,000 mcg total) by mouth daily.   ferrous sulfate 325 (65 FE) MG tablet Take 1 tablet (325 mg total) by mouth in the morning, at noon, and at bedtime.   levothyroxine 25 MCG tablet Commonly known as: SYNTHROID Take 25 mcg by mouth daily.   loperamide 2 MG capsule Commonly known as: IMODIUM Take 2-4 mg by mouth 2 (two) times daily as needed for diarrhea or loose stools.   metoprolol succinate 50 MG 24 hr tablet Commonly known as: TOPROL-XL Take 50 mg by mouth daily.   nortriptyline 50 MG capsule Commonly known as: PAMELOR Take by mouth.   pantoprazole 20 MG tablet Commonly known as: PROTONIX Take 20 mg by mouth daily.   rivaroxaban 20 MG Tabs tablet Commonly known as: XARELTO Take 1 tablet (20 mg total) by mouth daily.   sertraline 100 MG tablet Commonly known as: ZOLOFT Take 150 mg by mouth daily.        Allergies:  Allergies  Allergen Reactions   Codeine Shortness Of Breath and Other (See Comments)    GI Upset  Pt states "makes heart flutter and trouble breathing" Other reaction(s): Other (See Comments) Chest Tightness, SOB, Tachycardia GI Upset  Pt states "makes heart flutter and trouble breathing" Other reaction(s): Other (See Comments) Chest Tightness, SOB, Tachycardia GI Upset  Pt states "makes heart flutter and trouble breathing"   Duloxetine Shortness Of Breath   Propoxyphene Shortness Of Breath    Other reaction(s): Other (See Comments) GI Upset   Alendronate Sodium     Other reaction(s): Other (See Comments) GI Upset   Atorvastatin     Other reaction(s): Other (See Comments) Myalgia   Benadryl [Diphenhydramine Hcl] Other (See Comments)    Restless legs   Cefuroxime Axetil Nausea Only    Cymbalta [Duloxetine Hcl]    Diphenhydramine    Lipitor [Atorvastatin Calcium]    Other     Other reaction(s): Other (See Comments) GI Upset   Prednisone     Other reaction(s): Other (See Comments) Chest Pain, SOB, "can't breathe good" Unable to sleep   Levofloxacin Rash and Other (See Comments)    Altered mental status    Family History: Family History  Problem Relation Age of Onset   Cancer Father        Bone   Kidney cancer Neg Hx  Prostate cancer Neg Hx    Bladder Cancer Neg Hx     Social History:  reports that she has never smoked. She has never used smokeless tobacco. She reports that she does not drink alcohol and does not use drugs.  ROS:                                        Physical Exam: There were no vitals taken for this visit.  Constitutional:  Alert and oriented, No acute distress. HEENT: [place] AT, moist mucus membranes.  Trachea midline, no masses.  Laboratory Data: Lab Results  Component Value Date   WBC 9.5 11/15/2020   HGB 10.8 (L) 11/15/2020   HCT 33.0 (L) 11/15/2020   MCV 93.5 11/15/2020   PLT 236 11/15/2020    Lab Results  Component Value Date   CREATININE 1.08 (H) 04/30/2019    No results found for: "PSA"  No results found for: "TESTOSTERONE"  No results found for: "HGBA1C"  Urinalysis    Component Value Date/Time   COLORURINE YELLOW (A) 04/28/2019 1745   APPEARANCEUR HAZY (A) 04/28/2019 1745   APPEARANCEUR Clear 04/17/2016 1319   LABSPEC 1.021 04/28/2019 1745   LABSPEC 1.020 03/10/2013 1507   PHURINE 5.0 04/28/2019 1745   GLUCOSEU NEGATIVE 04/28/2019 1745   GLUCOSEU Negative 03/10/2013 1507   HGBUR NEGATIVE 04/28/2019 1745   BILIRUBINUR NEGATIVE 04/28/2019 1745   BILIRUBINUR Negative 04/17/2016 1319   BILIRUBINUR Negative 03/10/2013 1507   KETONESUR NEGATIVE 04/28/2019 1745   PROTEINUR NEGATIVE 04/28/2019 1745   NITRITE POSITIVE (A) 04/28/2019 1745   LEUKOCYTESUR NEGATIVE 04/28/2019 1745    LEUKOCYTESUR 2+ 03/10/2013 1507    Pertinent Imaging: Urine reviewed and sent for culture.  Chart reviewed  Assessment & Plan: Patient has urge incontinence and possible stress incontinence.  She has bedwetting.  Based upon comorbidities and age I will treat her without urodynamics.  She may need urinary prophylaxis in the future pending results.  She does get recurrent urinary tract infections by history.  Reassess in 6 weeks with pelvic examination and cystoscopy on Gemtesa samples and prescription   we need to have reasonable treatment goals  1. Mixed incontinence  - Urinalysis, Complete   No follow-ups on file.  Martina Sinner, MD  [facility] [address] [place], [place] [address] [phone]

## 2022-09-16 NOTE — Patient Instructions (Signed)

## 2022-09-20 LAB — CULTURE, URINE COMPREHENSIVE

## 2022-09-23 ENCOUNTER — Other Ambulatory Visit: Payer: Self-pay | Admitting: Urology

## 2022-09-23 MED ORDER — NITROFURANTOIN MACROCRYSTAL 100 MG PO CAPS: 100.0000 mg | ORAL_CAPSULE | Freq: Two times a day (BID) | ORAL | 0 refills | Status: DC

## 2022-11-11 ENCOUNTER — Ambulatory Visit (INDEPENDENT_AMBULATORY_CARE_PROVIDER_SITE_OTHER): Payer: 59 | Admitting: Urology

## 2022-11-11 VITALS — BP 104/68 | HR 109 | Ht 65.0 in | Wt 129.0 lb

## 2022-11-11 DIAGNOSIS — N3946 Mixed incontinence: Secondary | ICD-10-CM | POA: Diagnosis not present

## 2022-11-11 LAB — URINALYSIS, COMPLETE
Bilirubin, UA: NEGATIVE
Glucose, UA: NEGATIVE
Ketones, UA: NEGATIVE
Nitrite, UA: NEGATIVE
Protein,UA: NEGATIVE
Specific Gravity, UA: 1.01 (ref 1.005–1.030)
Urobilinogen, Ur: 0.2 mg/dL (ref 0.2–1.0)
pH, UA: 5.5 (ref 5.0–7.5)

## 2022-11-11 LAB — MICROSCOPIC EXAMINATION: WBC, UA: >30 /HPF — AB (ref 0–5)

## 2022-11-11 MED ORDER — CEPHALEXIN 500 MG PO CAPS: 500.0000 mg | ORAL_CAPSULE | Freq: Three times a day (TID) | ORAL | 0 refills | Status: DC

## 2022-11-11 MED ORDER — NITROFURANTOIN MACROCRYSTAL 100 MG PO CAPS
100.0000 mg | ORAL_CAPSULE | Freq: Four times a day (QID) | ORAL | 11 refills | Status: DC
Start: 2022-11-11 — End: 2022-11-11

## 2022-11-11 MED ORDER — NITROFURANTOIN MACROCRYSTAL 100 MG PO CAPS
100.0000 mg | ORAL_CAPSULE | Freq: Once | ORAL | 11 refills | Status: AC
Start: 2022-11-11 — End: 2022-11-11

## 2022-11-11 NOTE — Addendum Note (Signed)
Addended byRanda Lynn on: 11/11/2022 03:38 PM   Modules accepted: Orders
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
## 2022-11-16 LAB — CULTURE, URINE COMPREHENSIVE

## 2022-11-18 ENCOUNTER — Other Ambulatory Visit: Payer: Self-pay | Admitting: *Deleted

## 2022-11-18 MED ORDER — DOXYCYCLINE HYCLATE 100 MG PO CAPS: 100.0000 mg | ORAL_CAPSULE | Freq: Two times a day (BID) | ORAL | 0 refills | Status: DC

## 2022-11-21 ENCOUNTER — Encounter: Payer: Self-pay | Admitting: Urology

## 2022-11-21 ENCOUNTER — Telehealth: Payer: Self-pay | Admitting: *Deleted

## 2022-11-21 NOTE — Telephone Encounter (Signed)
Pt calling stating that the Doxycycline is making her sick, pt states she only took 2 tablets and she feels bad and she can not take the medication. Pt asking what else can she take?

## 2022-11-25 MED ORDER — FOSFOMYCIN TROMETHAMINE 3 G PO PACK: 3.0000 g | PACK | Freq: Once | ORAL | 0 refills | Status: AC

## 2022-11-25 NOTE — Telephone Encounter (Signed)
Spoke with patient and advised results rx sent to pharmacy by e-script

## 2023-01-13 ENCOUNTER — Ambulatory Visit: Payer: 59 | Admitting: Urology

## 2023-03-03 ENCOUNTER — Ambulatory Visit (INDEPENDENT_AMBULATORY_CARE_PROVIDER_SITE_OTHER): Payer: 59 | Admitting: Urology

## 2023-03-03 VITALS — BP 123/72 | HR 116

## 2023-03-03 DIAGNOSIS — N3946 Mixed incontinence: Secondary | ICD-10-CM | POA: Diagnosis not present

## 2023-03-03 MED ORDER — MIRABEGRON ER 50 MG PO TB24: 50.0000 mg | ORAL_TABLET | Freq: Every day | ORAL | 11 refills | Status: DC
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
## 2023-03-04 ENCOUNTER — Telehealth: Payer: Self-pay

## 2023-03-04 LAB — MICROSCOPIC EXAMINATION

## 2023-03-04 LAB — URINALYSIS, COMPLETE
Bilirubin, UA: NEGATIVE
Glucose, UA: NEGATIVE
Ketones, UA: NEGATIVE
Nitrite, UA: NEGATIVE
Protein,UA: NEGATIVE
RBC, UA: NEGATIVE
Specific Gravity, UA: 1.015 (ref 1.005–1.030)
Urobilinogen, Ur: 0.2 mg/dL (ref 0.2–1.0)
pH, UA: 6.5 (ref 5.0–7.5)

## 2023-03-04 NOTE — Telephone Encounter (Signed)
 Pt called triage stating she feels like the macrodantin  she has been on for 4 week is causing her throat, tongue and gums to swell up. Pt states this has been going on for about 3-4 weeks. Pt was advise to stop antibiotic until we get a response. Pt voiced understanding. Please advise. You saw pt yesterday but she said she didn't mention it during the visit.

## 2023-03-06 LAB — CULTURE, URINE COMPREHENSIVE

## 2023-03-07 NOTE — Telephone Encounter (Signed)
 Spoke with patient and she will continue myrbetriq and macrodantin. Medication list updated

## 2023-03-07 NOTE — Addendum Note (Signed)
 Addended by: Sueanne Margarita on: 03/07/2023 11:55 AM   Modules accepted: Orders

## 2023-03-07 NOTE — Telephone Encounter (Signed)
 Patient called this morning and stated that she still has swollen/broken out throat,tongue,gums after stopping the Macrodantin . She doesn't think that was what was causing it. Patient is asking about resuming medication, or trying another medication so urinary issues don't reoccur.

## 2023-03-10 NOTE — Telephone Encounter (Signed)
 Patient called and stated that she is out of Macrodantin and Myrbetriq, and needs refills. Pharmacy is [facility].

## 2023-04-14 ENCOUNTER — Encounter: Payer: Self-pay | Admitting: Oncology

## 2023-04-21 ENCOUNTER — Ambulatory Visit (INDEPENDENT_AMBULATORY_CARE_PROVIDER_SITE_OTHER): Payer: 59 | Admitting: Urology

## 2023-04-21 ENCOUNTER — Encounter: Payer: Self-pay | Admitting: Oncology

## 2023-04-21 VITALS — BP 97/55 | HR 111

## 2023-04-21 DIAGNOSIS — N3941 Urge incontinence: Secondary | ICD-10-CM | POA: Diagnosis not present

## 2023-04-21 DIAGNOSIS — N3946 Mixed incontinence: Secondary | ICD-10-CM

## 2023-04-21 NOTE — Progress Notes (Signed)
04/21/2023 2:48 PM   Lauren Mccann 1930/08/26 [id number]  Referring provider: Kandyce Rud, MD [address] [facility] [place],  [place] [address]  No chief complaint on file.   HPI: Was consulted to assess the patient's urinary incontinence.  She is not a strong historian.  She has urge incontinence.  I am not convinced that she has stress incontinence.  She does have bedwetting.  I think she soaks 3 pads a day many pads at night   She voids every 1 or 2 hours depending on fluid intake.   She thinks she gets 6 infections in the last year but was nonspecific symptoms.  Possible burning.  Low back pain.  Symptoms sometimes respond to antibiotics   She has had neck surgery walks with a walker   Her friend said that she may have gotten diarrhea from sulfa and is often treated with either sulfa or Macrobid.  She has failed oxybutynin     Patient has urge incontinence and possible stress incontinence.  She has bedwetting.  Based upon comorbidities and age I will treat her without urodynamics.  She may need urinary prophylaxis in the future pending results.  She does get recurrent urinary tract infections by history.  Reassess in 6 weeks with pelvic examination and cystoscopy on Gemtesa samples and prescription    we need to have reasonable treatment goals   Last culture positive.  Think she has another infection but was not specific. Cystoscopy: Patient underwent flexible cystoscopy.  She had diffuse bladder erythema.  Urine very cloudy with sediment.  Urine aspirated and sent for culture.  No pain.  No carcinoma Based upon allergies I gave Keflex 500 mg 3 times a day for 7 days.  Patient then will go on daily Macrodantin 100 mg 30 x 11.  Reassess in 6 weeks.  Likely retry Gemtesa samples.  We need to control tract infections before can control incontinence   Gemtesa did not help but she understands we may try it again once urine is  sterilized.  She understands low back pain probably not from chronic cystitis but sometimes this   Today Frequency stable.  Last culture was positive Will having a lot of urge incontinence.  Clinically not infected.  On daily antibiotics Call if urine culture positive.  Reassess on Macrodantin daily and Myrbetriq 50 mg 30 x 11 in 6 weeks.  Discussed percutaneous tibial nerve stimulation.  She may or may not be a candidate to consider Botox  Today Frequency stable.  Last culture negative Clinically not infected.  Still has a lot of urge incontinence.  Symptoms are affecting her quality of life.          PMH: Past Medical History:  Diagnosis Date   A-fib (HCC)    Anemia    Anxiety    Arthritis    neck, back, knees   Breast cancer (HCC) 03/11/2014   Overview:  S/p right mastectomy.  Reoccurrence s/p left matstectomy.   Cancer [facility])    breast   Closed fracture of neck of left femur with routine healing 08/07/2014   Depression    Dyspnea    easily   Fibromyalgia    GERD (gastroesophageal reflux disease)    Headache    everyday   Hip fracture requiring operative repair (HCC) 07/04/2014   History of neck problems    shots in neck every three months   Hypertension    Hyperthyroidism  IBS (irritable bowel syndrome)    Neuropathy    Osteoporosis    Pulmonary embolism (HCC)    H/O   Stroke [facility])    in past no residual effects   Thyroid disease    Tremor of both hands    Vertigo    worse in past    Surgical History: Past Surgical History:  Procedure Laterality Date   ABDOMINAL HYSTERECTOMY     CATARACT EXTRACTION     CATARACT EXTRACTION W/PHACO Right 08/18/2017   Procedure: CATARACT EXTRACTION PHACO AND INTRAOCULAR LENS PLACEMENT (IOC) right;  Surgeon: Nevada Crane, MD;  Location: [facility];  Service: Ophthalmology;  Laterality: Right;   CHOLECYSTECTOMY     COLON SURGERY     resection   HIP PINNING,CANNULATED Left 07/03/2014   Procedure: CANNULATED HIP  PINNING;  Surgeon: Danelle Earthly, MD;  Location: [facility] ORS;  Service: Orthopedics;  Laterality: Left;   MASTECTOMY Bilateral    VENA CAVA FILTER PLACEMENT      Home Medications:  Allergies as of 04/21/2023       Reactions   Codeine Shortness Of Breath, Other (See Comments)   GI Upset  Pt states "makes heart flutter and trouble breathing" Other reaction(s): Other (See Comments) Chest Tightness, SOB, Tachycardia GI Upset  Pt states "makes heart flutter and trouble breathing" Other reaction(s): Other (See Comments) Chest Tightness, SOB, Tachycardia GI Upset  Pt states "makes heart flutter and trouble breathing"   Duloxetine Shortness Of Breath   Propoxyphene Shortness Of Breath   Other reaction(s): Other (See Comments) GI Upset   Alendronate Sodium    Other reaction(s): Other (See Comments) GI Upset   Atorvastatin    Other reaction(s): Other (See Comments) Myalgia   Benadryl [diphenhydramine Hcl] Other (See Comments)   Restless legs   Cefuroxime Axetil Nausea Only   Cymbalta [duloxetine Hcl]    Diphenhydramine    Lipitor [atorvastatin Calcium]    Other    Other reaction(s): Other (See Comments) GI Upset   Prednisone    Other reaction(s): Other (See Comments) Chest Pain, SOB, "can't breathe good" Unable to sleep   Sulfa Antibiotics Diarrhea   Levofloxacin Rash, Other (See Comments)   Altered mental status        Medication List        Accurate as of April 21, 2023  2:48 PM. If you have any questions, ask your nurse or doctor.          acetaminophen 325 MG tablet Commonly known as: TYLENOL Take 650 mg by mouth every 4 (four) hours as needed for mild pain or fever.   ALPRAZolam 1 MG tablet Commonly known as: XANAX Take 0.5 mg by mouth 2 (two) times daily as needed for anxiety.   cyanocobalamin 1000 MCG tablet Take 1 tablet (1,000 mcg total) by mouth daily.   ferrous sulfate 325 (65 FE) MG tablet Take 1 tablet (325 mg total) by mouth in the morning,  at noon, and at bedtime.   levothyroxine 25 MCG tablet Commonly known as: SYNTHROID Take 25 mcg by mouth daily.   loperamide 2 MG capsule Commonly known as: IMODIUM Take 2-4 mg by mouth 2 (two) times daily as needed for diarrhea or loose stools.   metoprolol succinate 50 MG 24 hr tablet Commonly known as: TOPROL-XL Take 50 mg by mouth daily.   mirabegron ER 50 MG Tb24 tablet Commonly known as: MYRBETRIQ Take 1 tablet (50 mg total) by mouth daily.   nitrofurantoin 100 MG  capsule Commonly known as: MACRODANTIN Take 100 mg by mouth daily.   nortriptyline 50 MG capsule Commonly known as: PAMELOR Take by mouth.   oxybutynin 10 MG 24 hr tablet Commonly known as: DITROPAN-XL Take 10 mg by mouth daily.   pantoprazole 20 MG tablet Commonly known as: PROTONIX Take 20 mg by mouth daily.   QUEtiapine 25 MG tablet Commonly known as: SEROQUEL Take by mouth.   rivaroxaban 20 MG Tabs tablet Commonly known as: XARELTO Take 1 tablet (20 mg total) by mouth daily.   sertraline 100 MG tablet Commonly known as: ZOLOFT Take 150 mg by mouth daily.        Allergies:  Allergies  Allergen Reactions   Codeine Shortness Of Breath and Other (See Comments)    GI Upset  Pt states "makes heart flutter and trouble breathing" Other reaction(s): Other (See Comments) Chest Tightness, SOB, Tachycardia GI Upset  Pt states "makes heart flutter and trouble breathing" Other reaction(s): Other (See Comments) Chest Tightness, SOB, Tachycardia GI Upset  Pt states "makes heart flutter and trouble breathing"   Duloxetine Shortness Of Breath   Propoxyphene Shortness Of Breath    Other reaction(s): Other (See Comments) GI Upset   Alendronate Sodium     Other reaction(s): Other (See Comments) GI Upset   Atorvastatin     Other reaction(s): Other (See Comments) Myalgia   Benadryl [Diphenhydramine Hcl] Other (See Comments)    Restless legs   Cefuroxime Axetil Nausea Only   Cymbalta  [Duloxetine Hcl]    Diphenhydramine    Lipitor [Atorvastatin Calcium]    Other     Other reaction(s): Other (See Comments) GI Upset   Prednisone     Other reaction(s): Other (See Comments) Chest Pain, SOB, "can't breathe good" Unable to sleep   Sulfa Antibiotics Diarrhea   Levofloxacin Rash and Other (See Comments)    Altered mental status    Family History: Family History  Problem Relation Age of Onset   Cancer Father        Bone   Kidney cancer Neg Hx    Prostate cancer Neg Hx    Bladder Cancer Neg Hx     Social History:  reports that she has never smoked. She has never used smokeless tobacco. She reports that she does not drink alcohol and does not use drugs.  ROS:                                        Physical Exam: There were no vitals taken for this visit.  Constitutional:  Alert and oriented, No acute distress.  Laboratory Data: Lab Results  Component Value Date   WBC 9.5 11/15/2020   HGB 10.8 (L) 11/15/2020   HCT 33.0 (L) 11/15/2020   MCV 93.5 11/15/2020   PLT 236 11/15/2020    Lab Results  Component Value Date   CREATININE 1.08 (H) 04/30/2019    No results found for: "PSA"  No results found for: "TESTOSTERONE"  No results found for: "HGBA1C"  Urinalysis    Component Value Date/Time   COLORURINE YELLOW (A) 04/28/2019 1745   APPEARANCEUR Clear 03/03/2023 1440   LABSPEC 1.021 04/28/2019 1745   LABSPEC 1.020 03/10/2013 1507   PHURINE 5.0 04/28/2019 1745   GLUCOSEU Negative 03/03/2023 1440   GLUCOSEU Negative 03/10/2013 1507   HGBUR NEGATIVE 04/28/2019 1745   BILIRUBINUR Negative 03/03/2023 1440   BILIRUBINUR Negative 03/10/2013  1507   KETONESUR NEGATIVE 04/28/2019 1745   PROTEINUR Negative 03/03/2023 1440   PROTEINUR NEGATIVE 04/28/2019 1745   NITRITE Negative 03/03/2023 1440   NITRITE POSITIVE (A) 04/28/2019 1745   LEUKOCYTESUR 1+ (A) 03/03/2023 1440   LEUKOCYTESUR NEGATIVE 04/28/2019 1745   LEUKOCYTESUR 2+  03/10/2013 1507    Pertinent Imaging:   Assessment & Plan: Patient has refractory urgency incontinence.  I talked to her about percutaneous tibial nerve stimulation and Botox.  I do not think she is a good candidate for InterStim.  Both handouts given.  Full template discussed.  She chose [facility] which aligns good option for her.  Call if culture positive.  Stay on once a day Macrodantin.  There are no diagnoses linked to this encounter.  No follow-ups on file.  Martina Sinner, MD  [facility] [address] [place], [place] [address] [phone]

## 2023-04-21 NOTE — Patient Instructions (Signed)

## 2023-04-22 LAB — URINALYSIS, COMPLETE
Bilirubin, UA: NEGATIVE
Glucose, UA: NEGATIVE
Nitrite, UA: NEGATIVE
Protein,UA: NEGATIVE
RBC, UA: NEGATIVE
Specific Gravity, UA: 1.02 (ref 1.005–1.030)
Urobilinogen, Ur: 0.2 mg/dL (ref 0.2–1.0)
pH, UA: 5.5 (ref 5.0–7.5)

## 2023-04-22 LAB — MICROSCOPIC EXAMINATION

## 2023-04-23 LAB — CULTURE, URINE COMPREHENSIVE

## 2023-05-05 ENCOUNTER — Observation Stay
Admission: EM | Admit: 2023-05-05 | Discharge: 2023-05-06 | Disposition: A | Discharge status: Home / Self Care | Attending: Hospitalist | Admitting: Hospitalist

## 2023-05-05 ENCOUNTER — Other Ambulatory Visit: Payer: Self-pay

## 2023-05-05 ENCOUNTER — Emergency Department

## 2023-05-05 DIAGNOSIS — R319 Hematuria, unspecified: Secondary | ICD-10-CM

## 2023-05-05 DIAGNOSIS — Z7901 Long term (current) use of anticoagulants: Secondary | ICD-10-CM

## 2023-05-05 DIAGNOSIS — D649 Anemia, unspecified: Secondary | ICD-10-CM

## 2023-05-05 DIAGNOSIS — N1831 Chronic kidney disease, stage 3a: Secondary | ICD-10-CM | POA: Insufficient documentation

## 2023-05-05 DIAGNOSIS — Z79899 Other long term (current) drug therapy: Secondary | ICD-10-CM | POA: Insufficient documentation

## 2023-05-05 DIAGNOSIS — I129 Hypertensive chronic kidney disease with stage 1 through stage 4 chronic kidney disease, or unspecified chronic kidney disease: Secondary | ICD-10-CM | POA: Insufficient documentation

## 2023-05-05 DIAGNOSIS — I951 Orthostatic hypotension: Secondary | ICD-10-CM | POA: Diagnosis not present

## 2023-05-05 DIAGNOSIS — D509 Iron deficiency anemia, unspecified: Principal | ICD-10-CM | POA: Insufficient documentation

## 2023-05-05 DIAGNOSIS — Z9889 Other specified postprocedural states: Secondary | ICD-10-CM | POA: Diagnosis not present

## 2023-05-05 DIAGNOSIS — I2699 Other pulmonary embolism without acute cor pulmonale: Secondary | ICD-10-CM | POA: Diagnosis present

## 2023-05-05 DIAGNOSIS — N189 Chronic kidney disease, unspecified: Secondary | ICD-10-CM

## 2023-05-05 DIAGNOSIS — R5381 Other malaise: Secondary | ICD-10-CM | POA: Diagnosis present

## 2023-05-05 DIAGNOSIS — Z8673 Personal history of transient ischemic attack (TIA), and cerebral infarction without residual deficits: Secondary | ICD-10-CM | POA: Diagnosis not present

## 2023-05-05 DIAGNOSIS — Z853 Personal history of malignant neoplasm of breast: Secondary | ICD-10-CM | POA: Diagnosis not present

## 2023-05-05 LAB — COMPREHENSIVE METABOLIC PANEL
ALT: 12 U/L (ref 0–44)
AST: 21 U/L (ref 15–41)
Albumin: 3.9 g/dL (ref 3.5–5.0)
Alkaline Phosphatase: 51 U/L (ref 38–126)
Anion gap: 11 (ref 5–15)
BUN: 24 mg/dL — ABNORMAL HIGH (ref 8–23)
CO2: 22 mmol/L (ref 22–32)
Calcium: 9.3 mg/dL (ref 8.9–10.3)
Chloride: 100 mmol/L (ref 98–111)
Creatinine, Ser: 1.16 mg/dL — ABNORMAL HIGH (ref 0.44–1.00)
GFR, Estimated: 45 mL/min — ABNORMAL LOW (ref >60)
Glucose, Bld: 104 mg/dL — ABNORMAL HIGH (ref 70–99)
Potassium: 4 mmol/L (ref 3.5–5.1)
Sodium: 133 mmol/L — ABNORMAL LOW (ref 135–145)
Total Bilirubin: 0.6 mg/dL (ref 0.0–1.2)
Total Protein: 7.6 g/dL (ref 6.5–8.1)

## 2023-05-05 LAB — URINALYSIS, ROUTINE W REFLEX MICROSCOPIC
Bilirubin Urine: NEGATIVE
Glucose, UA: NEGATIVE mg/dL
Ketones, ur: NEGATIVE mg/dL
Leukocytes,Ua: NEGATIVE
Nitrite: NEGATIVE
Protein, ur: 30 mg/dL — AB
RBC / HPF: >50 RBC/hpf (ref 0–5)
Specific Gravity, Urine: 1.01 (ref 1.005–1.030)
pH: 5 (ref 5.0–8.0)

## 2023-05-05 LAB — CBC
HCT: 24.9 % — ABNORMAL LOW (ref 36.0–46.0)
Hemoglobin: 7.8 g/dL — ABNORMAL LOW (ref 12.0–15.0)
MCH: 25.6 pg — ABNORMAL LOW (ref 26.0–34.0)
MCHC: 31.3 g/dL (ref 30.0–36.0)
MCV: 81.6 fL (ref 80.0–100.0)
Platelets: 302 10*3/uL (ref 150–400)
RBC: 3.05 MIL/uL — ABNORMAL LOW (ref 3.87–5.11)
RDW: 15.2 % (ref 11.5–15.5)
WBC: 7.5 10*3/uL (ref 4.0–10.5)
nRBC: 0 % (ref 0.0–0.2)

## 2023-05-05 LAB — PREPARE RBC (CROSSMATCH)

## 2023-05-05 MED ORDER — SODIUM CHLORIDE 0.9 % IV BOLUS
1000.0000 mL | Freq: Once | INTRAVENOUS | Status: AC
  Administered 2023-05-05: 1000 mL via INTRAVENOUS

## 2023-05-05 MED ORDER — SODIUM CHLORIDE 0.9% IV SOLUTION: Freq: Once | INTRAVENOUS | Status: AC

## 2023-05-05 MED ORDER — IOHEXOL 300 MG/ML  SOLN
75.0000 mL | Freq: Once | INTRAMUSCULAR | Status: AC | PRN
  Administered 2023-05-05: 75 mL via INTRAVENOUS

## 2023-05-05 NOTE — ED Notes (Signed)
 Pt incontinent of urine. Incontinence care provided. New blankets and linens provided.

## 2023-05-05 NOTE — ED Notes (Signed)
 Patient transported to CT

## 2023-05-05 NOTE — ED Triage Notes (Signed)
 Pt sent her by her PCP. Per PCP pt had blood work done on 2/24 and her hgb was 7.7 however, PCP sts that 6 months ago pt hgb was 9.4. PCP would like pt evaluated and transfused.

## 2023-05-05 NOTE — ED Provider Notes (Signed)
 [facility] Provider Note    Event Date/Time   First MD Initiated Contact with Patient 05/05/23 2011     (approximate)   History   No chief complaint on file.   HPI  Lauren Mccann is a 89 y.o. female past medical history significant for prior blood clot with IVC filter in place, atrial fibrillation on Xarelto, presents to the emergency department for anemia.  Patient was sent from her primary care physician office for a significant drop of her hemoglobin.  Patient states that she has not been feeling well for the past 2 to 3 days.  Complaining of pain to her lower abdomen.  States that she has had some ongoing burning with urination.  Noted some blood whenever she wipes.  Denies noting any blood in her stool and believes that is coming from her urine but not her bowels.  States that she saw her primary care physician and was told she needed to come to the emergency department for blood transfusion.  States that she was told to stop taking Xarelto today but she had been taking it up until today.  Denies any falls.  Complaining of generalized weakness and fatigue.  Denies any nausea or vomiting.  States that she has received a blood transfusion in the past.     Physical Exam   Triage Vital Signs: ED Triage Vitals  Encounter Vitals Group     BP 05/05/23 1546 128/74     Systolic BP Percentile --      Diastolic BP Percentile --      Pulse Rate 05/05/23 1546 95     Resp 05/05/23 1546 17     Temp 05/05/23 1546 97.6 F (36.4 C)     Temp Source 05/05/23 1546 Oral     SpO2 05/05/23 1546 98 %     Weight 05/05/23 1543 130 lb (59 kg)     Height 05/05/23 1543 5\' 2"  (1.575 m)     Head Circumference --      Peak Flow --      Pain Score 05/05/23 1542 2     Pain Loc --      Pain Education --      Exclude from Growth Chart --     Most recent vital signs: Vitals:   05/05/23 2230 05/05/23 2300  BP: (!) 142/67 (!) 141/79  Pulse: 80 83  Resp: 19 20  Temp: 97.9  F (36.6 C)   SpO2: 97% 98%    Physical Exam Exam conducted with a chaperone present.  Constitutional:      Appearance: She is well-developed.  HENT:     Head: Atraumatic.     Mouth/Throat:     Mouth: Mucous membranes are moist.  Eyes:     Extraocular Movements: Extraocular movements intact.     Pupils: Pupils are equal, round, and reactive to light.  Cardiovascular:     Rate and Rhythm: Regular rhythm.  Pulmonary:     Effort: No respiratory distress.  Abdominal:     General: There is no distension.     Tenderness: There is abdominal tenderness (Lower abdominal tenderness to palpation with no rebound or guarding).  Genitourinary:    Comments: No gross blood or melena Musculoskeletal:        General: Normal range of motion.     Cervical back: Normal range of motion.     Right lower leg: No edema.     Left lower leg: No edema.  Skin:    General: Skin is warm.     Capillary Refill: Capillary refill takes less than 2 seconds.  Neurological:     Mental Status: She is alert. Mental status is at baseline.  Psychiatric:        Mood and Affect: Mood normal.     IMPRESSION / MDM / ASSESSMENT AND PLAN / ED COURSE  I reviewed the triage vital signs and the nursing notes.  Differential diagnosis including anemia, GI bleed, urinary tract infection, kidney stone, AAA   No tachycardic or bradycardic dysrhythmias while on cardiac telemetry.  RADIOLOGY I independently reviewed imaging, my interpretation of imaging: On my evaluation of CT scan renal cyst but no obvious signs of an obstructive process.  Hiatal hernia noted.  Appears to have a normal aorta.  IVC filter in place.  Significant diverticulosis.  CT scan read is currently pending  LABS (all labs ordered are listed, but only abnormal results are displayed) Labs interpreted as -    Labs Reviewed  COMPREHENSIVE METABOLIC PANEL - Abnormal; Notable for the following components:      Result Value   Sodium 133 (*)     Glucose, Bld 104 (*)    BUN 24 (*)    Creatinine, Ser 1.16 (*)    GFR, Estimated 45 (*)    All other components within normal limits  CBC - Abnormal; Notable for the following components:   RBC 3.05 (*)    Hemoglobin 7.8 (*)    HCT 24.9 (*)    MCH 25.6 (*)    All other components within normal limits  URINALYSIS, ROUTINE W REFLEX MICROSCOPIC - Abnormal; Notable for the following components:   Color, Urine YELLOW (*)    APPearance HAZY (*)    Hgb urine dipstick LARGE (*)    Protein, ur 30 (*)    Bacteria, UA RARE (*)    All other components within normal limits  TYPE AND SCREEN  PREPARE RBC (CROSSMATCH)     MDM  On arrival patient with symptomatic anemia.  Orthostatic blood pressures with a significant drop to 80 systolic with standing.  Concern for his symptomatic anemia.  Type and crossed for 1 unit PRBC.  Hemoglobin 7.9 which appears to be stable from her blood draw as an outpatient.  No obvious findings of a GI bleed.  Did have some findings concern for hematuria.  CT scan abdomen and pelvis ordered for further evaluation.  UA without obvious findings of urinary tract infection.  Type and cross for 1 unit PRBC.  Patient admitted for symptomatic anemia requiring blood transfusion.     PROCEDURES:  Critical Care performed: yes  .Critical Care  Performed by: Corena Herter, MD Authorized by: Corena Herter, MD   Critical care provider statement:    Critical care time (minutes):  30   Critical care time was exclusive of:  Separately billable procedures and treating other patients   Critical care was necessary to treat or prevent imminent or life-threatening deterioration of the following conditions:  Circulatory failure   Critical care was time spent personally by me on the following activities:  Development of treatment plan with patient or surrogate, discussions with consultants, evaluation of patient's response to treatment, examination of patient, ordering and review  of laboratory studies, ordering and review of radiographic studies, ordering and performing treatments and interventions, pulse oximetry, re-evaluation of patient's condition and review of old charts   Care discussed with: admitting provider     Patient's presentation is  most consistent with acute presentation with potential threat to life or bodily function.   MEDICATIONS ORDERED IN ED: Medications  0.9 %  sodium chloride infusion (Manually program via Guardrails IV Fluids) ( Intravenous New Bag/Given 05/05/23 2211)  sodium chloride 0.9 % bolus 1,000 mL (1,000 mLs Intravenous New Bag/Given 05/05/23 2208)  iohexol (OMNIPAQUE) 300 MG/ML solution 75 mL (75 mLs Intravenous Contrast Given 05/05/23 2129)    FINAL CLINICAL IMPRESSION(S) / ED DIAGNOSES   Final diagnoses:  Orthostatic hypotension  Symptomatic anemia     Rx / DC Orders   ED Discharge Orders     None        Note:  This document was prepared using Dragon voice recognition software and may include unintentional dictation errors.   Corena Herter, MD 05/05/23 [phone]

## 2023-05-05 NOTE — ED Notes (Signed)
 Pt ambulated to the toilet in the room to void, slight rose colored urine, when she wiped there was a small amount of blood and a small blood clot.  EDP notified.
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
## 2023-05-06 ENCOUNTER — Emergency Department

## 2023-05-06 DIAGNOSIS — N189 Chronic kidney disease, unspecified: Secondary | ICD-10-CM

## 2023-05-06 DIAGNOSIS — D649 Anemia, unspecified: Secondary | ICD-10-CM | POA: Diagnosis present

## 2023-05-06 DIAGNOSIS — R319 Hematuria, unspecified: Secondary | ICD-10-CM

## 2023-05-06 LAB — BASIC METABOLIC PANEL
Anion gap: 8 (ref 5–15)
BUN: 18 mg/dL (ref 8–23)
CO2: 24 mmol/L (ref 22–32)
Calcium: 9 mg/dL (ref 8.9–10.3)
Chloride: 106 mmol/L (ref 98–111)
Creatinine, Ser: 0.91 mg/dL (ref 0.44–1.00)
GFR, Estimated: 60 mL/min — ABNORMAL LOW (ref >60)
Glucose, Bld: 91 mg/dL (ref 70–99)
Potassium: 3.8 mmol/L (ref 3.5–5.1)
Sodium: 138 mmol/L (ref 135–145)

## 2023-05-06 LAB — PROTIME-INR
INR: 1.1 (ref 0.8–1.2)
Prothrombin Time: 14.3 s (ref 11.4–15.2)

## 2023-05-06 LAB — CBC
HCT: 26.6 % — ABNORMAL LOW (ref 36.0–46.0)
Hemoglobin: 8.4 g/dL — ABNORMAL LOW (ref 12.0–15.0)
MCH: 25.8 pg — ABNORMAL LOW (ref 26.0–34.0)
MCHC: 31.6 g/dL (ref 30.0–36.0)
MCV: 81.6 fL (ref 80.0–100.0)
Platelets: 279 10*3/uL (ref 150–400)
RBC: 3.26 MIL/uL — ABNORMAL LOW (ref 3.87–5.11)
RDW: 15.2 % (ref 11.5–15.5)
WBC: 6.5 10*3/uL (ref 4.0–10.5)
nRBC: 0 % (ref 0.0–0.2)

## 2023-05-06 LAB — RETICULOCYTES
Immature Retic Fract: 7 % (ref 2.3–15.9)
RBC.: 3.28 MIL/uL — ABNORMAL LOW (ref 3.87–5.11)
Retic Count, Absolute: 23.3 10*3/uL (ref 19.0–186.0)
Retic Ct Pct: 0.7 % (ref 0.4–3.1)

## 2023-05-06 LAB — APTT: aPTT: 32 s (ref 24–36)

## 2023-05-06 LAB — IRON AND TIBC
Iron: 28 ug/dL (ref 28–170)
Saturation Ratios: 7 % — ABNORMAL LOW (ref 10.4–31.8)
TIBC: 417 ug/dL (ref 250–450)
UIBC: 389 ug/dL

## 2023-05-06 LAB — TYPE AND SCREEN
ABO/RH(D): A POS
Antibody Screen: NEGATIVE
Unit division: 0

## 2023-05-06 LAB — BPAM RBC
Blood Product Expiration Date: 202504052359
ISSUE DATE / TIME: 202503032204
Unit Type and Rh: 6200

## 2023-05-06 LAB — FERRITIN: Ferritin: 8 ng/mL — ABNORMAL LOW (ref 11–307)

## 2023-05-06 LAB — FOLATE: Folate: 34 ng/mL (ref >5.9)

## 2023-05-06 LAB — TROPONIN I (HIGH SENSITIVITY): Troponin I (High Sensitivity): 5 ng/L (ref <18)

## 2023-05-06 LAB — VITAMIN B12: Vitamin B-12: 676 pg/mL (ref 180–914)

## 2023-05-06 MED ORDER — SODIUM CHLORIDE 0.9% FLUSH
3.0000 mL | Freq: Two times a day (BID) | INTRAVENOUS | Status: DC
  Administered 2023-05-06 (×2): 3 mL via INTRAVENOUS

## 2023-05-06 MED ORDER — ACETAMINOPHEN 325 MG PO TABS
650.0000 mg | ORAL_TABLET | Freq: Four times a day (QID) | ORAL | Status: DC | PRN
  Administered 2023-05-06: 650 mg via ORAL
  Filled 2023-05-06: qty 2

## 2023-05-06 MED ORDER — ACETAMINOPHEN 650 MG RE SUPP: 650.0000 mg | Freq: Four times a day (QID) | RECTAL | Status: DC | PRN

## 2023-05-06 MED ORDER — POLYETHYLENE GLYCOL 3350 17 G PO PACK: 17.0000 g | PACK | Freq: Every day | ORAL | Status: DC | PRN

## 2023-05-06 MED ORDER — IRON SUCROSE 200 MG IVPB - SIMPLE MED
200.0000 mg | Freq: Once | Status: AC
  Administered 2023-05-06: 200 mg via INTRAVENOUS
  Filled 2023-05-06: qty 200

## 2023-05-06 NOTE — Care Management CC44 (Signed)
 Condition Code 44 Documentation Completed  Patient Details  Name: TYTIANNA GREENLEY MRN: [id number] Date of Birth: 1930-04-14   Condition Code 44 given:  Yes Patient signature on Condition Code 44 notice:  Yes Documentation of 2 MD's agreement:  Yes Code 44 added to claim:  Yes    Chapman Fitch, RN 05/06/2023, 1:17 PM

## 2023-05-06 NOTE — Discharge Summary (Signed)
 Physician Discharge Summary   Lauren Mccann  female DOB: 1930-06-16  ZOX:[id number]  PCP: Kandyce Rud, MD  Admit date: 05/05/2023 Discharge date: 05/06/2023  Admitted From: home Disposition:  home CODE STATUS: Full code  Discharge Instructions     Discharge instructions   Complete by: As directed    You have anemia and iron level is on the low side.  You have received 1 unit of blood transfusion and IV iron.  Please take iron supplement, if you can't, then you may have to get IV iron infusion with your PCP.  Your urine has some blood in it.  No signs of infection.  Please hold your Kerin Salen until followup with your PCP.   [facility] Course:  For full details, please see H&P, progress notes, consult notes and ancillary notes.  Briefly,  Lauren Mccann is a 89 y.o. 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  * Symptomatic anemia Iron def Hgb 7.7 on presentation. Felt to be symptaomtic (faitgue).  Received 1 unit PRBC.  Anemia workup showed iron def.  Pt reported not taking iron supplement PTA due to her IBS and constipation. --Pt received IV iron.  Discharged on oral iron supplement, and if can't tolerate, may need to have outpatient IV iron infusion with PCP.   Chronic anticoagulation with Xarelto (stroke/pulmonary embolism) Hold xarelto due to hematuria and anemia pending PCP f/u.   Hematuria Reported by patient 4-5 days. Not felt to be exsanguinating. But likely contributing to anemia. CT abd pelvis neg for renal calculi.  UA with yellow color and large Hgb, neg nitrite/neg leuk.   --hold xarelto pending PCP f/u.   CKD 3a (chronic kidney disease) Serum Cr at baseline.    Pulmonary embolism (HCC) Per record review patient did have ivc filter in 2013. Hold xaretlo for now given hematuria and anemia.    Discharge Diagnoses:   Principal Problem:   Symptomatic anemia Active Problems:   Chronic anticoagulation with Xarelto (stroke/pulmonary embolism)   Pulmonary embolism (HCC)   CKD (chronic kidney disease)   Hematuria   Anemia   30 Day Unplanned Readmission Risk Score    Flowsheet Row ED to Hosp-Admission (Current) from 05/05/2023 in [facility]  30 Day Unplanned Readmission Risk Score (%) 19.65 Filed at 05/06/2023 0801       This score is the patient's risk of an unplanned readmission within 30 days of being discharged (0 -100%). The score is based on dignosis, age, lab data, medications, orders, and past utilization.   Low:  0-14.9   Medium: 15-21.9   High: 22-29.9   Extreme: 30 and above         Discharge Instructions:  Allergies as of 05/06/2023       Reactions   Codeine Shortness Of Breath, Other (See Comments)   GI Upset  Pt states "makes heart flutter and trouble breathing" Other reaction(s): Other (See Comments) Chest Tightness, SOB, Tachycardia GI Upset  Pt states "makes heart flutter and trouble breathing" Other reaction(s): Other (See Comments) Chest Tightness, SOB, Tachycardia GI Upset  Pt states "makes heart flutter and trouble breathing"   Duloxetine Shortness Of Breath   Propoxyphene Shortness Of Breath   Other reaction(s): Other (See Comments) GI Upset   Alendronate Sodium    Other reaction(s): Other (See Comments)  GI Upset   Atorvastatin    Other reaction(s): Other (See Comments) Myalgia   Benadryl [diphenhydramine Hcl] Other (See Comments)   Restless legs   Cefuroxime Axetil Nausea Only   Cymbalta [duloxetine Hcl]    Diphenhydramine    Lipitor [atorvastatin Calcium]    Other    Other reaction(s): Other (See Comments) GI Upset   Prednisone    Other reaction(s): Other (See Comments) Chest Pain, SOB, "can't breathe good" Unable to sleep   Sulfa Antibiotics Diarrhea   Levofloxacin Rash, Other (See Comments)   Altered mental  status        Medication List     PAUSE taking these medications    rivaroxaban 20 MG Tabs tablet Wait to take this until your doctor or other care provider tells you to start again. Due to bloody urine and worsening anemia. Commonly known as: XARELTO Take 1 tablet (20 mg total) by mouth daily.       STOP taking these medications    nitrofurantoin 100 MG capsule Commonly known as: MACRODANTIN       TAKE these medications    acetaminophen 325 MG tablet Commonly known as: TYLENOL Take 650 mg by mouth every 4 (four) hours as needed for mild pain or fever.   ALPRAZolam 1 MG tablet Commonly known as: XANAX Take 0.5 mg by mouth 2 (two) times daily as needed for anxiety.   cyanocobalamin 1000 MCG tablet Take 1 tablet (1,000 mcg total) by mouth daily.   ferrous sulfate 325 (65 FE) MG tablet Take 1 tablet (325 mg total) by mouth in the morning, at noon, and at bedtime.   levothyroxine 25 MCG tablet Commonly known as: SYNTHROID Take 25 mcg by mouth daily.   loperamide 2 MG capsule Commonly known as: IMODIUM Take 2-4 mg by mouth 2 (two) times daily as needed for diarrhea or loose stools.   metoprolol succinate 50 MG 24 hr tablet Commonly known as: TOPROL-XL Take 50 mg by mouth daily.   nortriptyline 50 MG capsule Commonly known as: PAMELOR Take by mouth.   oxybutynin 10 MG 24 hr tablet Commonly known as: DITROPAN-XL Take 10 mg by mouth daily.   pantoprazole 20 MG tablet Commonly known as: PROTONIX Take 20 mg by mouth daily.   QUEtiapine 25 MG tablet Commonly known as: SEROQUEL Take by mouth.   sertraline 100 MG tablet Commonly known as: ZOLOFT Take 150 mg by mouth daily.         Follow-up Information     Kandyce Rud, MD Follow up in 1 week(s).   Specialty: Family Medicine Contact information: [address] [facility] [place] [address] [phone]                 Allergies   Allergen Reactions   Codeine Shortness Of Breath and Other (See Comments)    GI Upset  Pt states "makes heart flutter and trouble breathing" Other reaction(s): Other (See Comments) Chest Tightness, SOB, Tachycardia GI Upset  Pt states "makes heart flutter and trouble breathing" Other reaction(s): Other (See Comments) Chest Tightness, SOB, Tachycardia GI Upset  Pt states "makes heart flutter and trouble breathing"   Duloxetine Shortness Of Breath   Propoxyphene Shortness Of Breath    Other reaction(s): Other (See Comments) GI Upset   Alendronate Sodium     Other reaction(s): Other (See Comments) GI Upset   Atorvastatin     Other reaction(s): Other (See Comments) Myalgia   Benadryl [  Diphenhydramine Hcl] Other (See Comments)    Restless legs   Cefuroxime Axetil Nausea Only   Cymbalta [Duloxetine Hcl]    Diphenhydramine    Lipitor [Atorvastatin Calcium]    Other     Other reaction(s): Other (See Comments) GI Upset   Prednisone     Other reaction(s): Other (See Comments) Chest Pain, SOB, "can't breathe good" Unable to sleep   Sulfa Antibiotics Diarrhea   Levofloxacin Rash and Other (See Comments)    Altered mental status     The results of significant diagnostics from this hospitalization (including imaging, microbiology, ancillary and laboratory) are listed below for reference.   Consultations:   Procedures/Studies: DG Chest Port 1 View Result Date: 05/06/2023 CLINICAL DATA:  Fatigue EXAM: PORTABLE CHEST 1 VIEW COMPARISON:  04/28/2019 FINDINGS: Cardiac shadow is stable. Aortic calcifications are noted. The lungs are well aerated bilaterally. No focal infiltrate or effusion is seen. No bony abnormality is noted. IMPRESSION: No acute abnormality noted. Electronically Signed   By: Alcide Clever M.D.   On: 05/06/2023 02:20   CT ABDOMEN PELVIS W CONTRAST Result Date: 05/06/2023 CLINICAL DATA:  Acute abdominal pain EXAM: CT ABDOMEN AND PELVIS WITH CONTRAST TECHNIQUE:  Multidetector CT imaging of the abdomen and pelvis was performed using the standard protocol following bolus administration of intravenous contrast. RADIATION DOSE REDUCTION: This exam was performed according to the departmental dose-optimization program which includes automated exposure control, adjustment of the mA and/or kV according to patient size and/or use of iterative reconstruction technique. CONTRAST:  75mL OMNIPAQUE IOHEXOL 300 MG/ML  SOLN COMPARISON:  None Available. FINDINGS: Lower chest: 4 mm nodule is noted in the lingula best seen on image number 2 of series 5. Hepatobiliary: No focal liver abnormality is seen. Status post cholecystectomy. No biliary dilatation. Pancreas: Unremarkable. No pancreatic ductal dilatation or surrounding inflammatory changes. Spleen: Normal in size without focal abnormality. Adrenals/Urinary Tract: Adrenal glands are within normal limits. Kidneys demonstrate a normal enhancement pattern with the exception of a simple cyst in the midportion of the left kidney. This is stable from the prior exam. No further follow-up is recommended. No renal calculi or obstructive changes are seen. The bladder is decompressed. Stomach/Bowel: Scattered diverticular change of the sigmoid colon is noted. No findings to suggest diverticulitis are noted. Postsurgical changes in the right colon are seen. No obstructive changes at the anastomosis are noted. Small bowel and stomach are within normal limits with the exception of a large sliding-type hiatal hernia. Vascular/Lymphatic: Aortic atherosclerosis. No enlarged abdominal or pelvic lymph nodes. IVC filter is noted in place. Reproductive: Status post hysterectomy. No adnexal masses. Other: No abdominal wall hernia or abnormality. No abdominopelvic ascites. Musculoskeletal: No acute or significant osseous findings. IMPRESSION: Diverticulosis without diverticulitis. Large sliding-type hiatal hernia. 4 mm nodule in the lingula. No follow-up needed  if patient is low-risk. Non-contrast chest CT can be considered in 12 months if patient is high-risk. This recommendation follows the consensus statement: Guidelines for Management of Incidental Pulmonary Nodules Detected on CT Images: From the Fleischner Society 2017; Radiology 2017; 284:228-243. Electronically Signed   By: Alcide Clever M.D.   On: 05/06/2023 00:32      Labs: BNP (last 3 results) No results for input(s): "BNP" in the last 8760 hours. Basic Metabolic Panel: Recent Labs  Lab 05/05/23 1546 05/06/23 0359  NA 133* 138  K 4.0 3.8  CL 100 106  CO2 22 24  GLUCOSE 104* 91  BUN 24* 18  CREATININE 1.16* 0.91  CALCIUM  9.3 9.0   Liver Function Tests: Recent Labs  Lab 05/05/23 1546  AST 21  ALT 12  ALKPHOS 51  BILITOT 0.6  PROT 7.6  ALBUMIN 3.9   No results for input(s): "LIPASE", "AMYLASE" in the last 168 hours. No results for input(s): "AMMONIA" in the last 168 hours. CBC: Recent Labs  Lab 05/05/23 1546 05/06/23 0359  WBC 7.5 6.5  HGB 7.8* 8.4*  HCT 24.9* 26.6*  MCV 81.6 81.6  PLT 302 279   Cardiac Enzymes: No results for input(s): "CKTOTAL", "CKMB", "CKMBINDEX", "TROPONINI" in the last 168 hours. BNP: Invalid input(s): "POCBNP" CBG: No results for input(s): "GLUCAP" in the last 168 hours. D-Dimer No results for input(s): "DDIMER" in the last 72 hours. Hgb A1c No results for input(s): "HGBA1C" in the last 72 hours. Lipid Profile No results for input(s): "CHOL", "HDL", "LDLCALC", "TRIG", "CHOLHDL", "LDLDIRECT" in the last 72 hours. Thyroid function studies No results for input(s): "TSH", "T4TOTAL", "T3FREE", "THYROIDAB" in the last 72 hours.  Invalid input(s): "FREET3" Anemia work up Recent Labs    05/06/23 0359  FOLATE 34.0  FERRITIN 8*  TIBC 417  IRON 28  RETICCTPCT 0.7   Urinalysis    Component Value Date/Time   COLORURINE YELLOW (A) 05/05/2023 2043   APPEARANCEUR HAZY (A) 05/05/2023 2043   APPEARANCEUR Clear 04/21/2023 1455    LABSPEC 1.010 05/05/2023 2043   LABSPEC 1.020 03/10/2013 1507   PHURINE 5.0 05/05/2023 2043   GLUCOSEU NEGATIVE 05/05/2023 2043   GLUCOSEU Negative 03/10/2013 1507   HGBUR LARGE (A) 05/05/2023 2043   BILIRUBINUR NEGATIVE 05/05/2023 2043   BILIRUBINUR Negative 04/21/2023 1455   BILIRUBINUR Negative 03/10/2013 1507   KETONESUR NEGATIVE 05/05/2023 2043   PROTEINUR 30 (A) 05/05/2023 2043   NITRITE NEGATIVE 05/05/2023 2043   LEUKOCYTESUR NEGATIVE 05/05/2023 2043   LEUKOCYTESUR 2+ 03/10/2013 1507   Sepsis Labs Recent Labs  Lab 05/05/23 1546 05/06/23 0359  WBC 7.5 6.5   Microbiology No results found for this or any previous visit (from the past 240 hours).   Total time spend on discharging this patient, including the last patient exam, discussing the hospital stay, instructions for ongoing care as it relates to all pertinent caregivers, as well as preparing the medical discharge records, prescriptions, and/or referrals as applicable, is 45 minutes.    Darlin Priestly, MD  [facility] 05/06/2023, 10:48 AM

## 2023-05-06 NOTE — Assessment & Plan Note (Signed)
 This is chronic. Per record review patient did have ivc filter in 2013. Hold xaretlo for now given hematuria.

## 2023-05-06 NOTE — Care Management Obs Status (Signed)
 MEDICARE OBSERVATION STATUS NOTIFICATION   Patient Details  Name: Lauren Mccann MRN: [id number] Date of Birth: [birth year]   Medicare Observation Status Notification Given:  Yes    Chapman Fitch, RN 05/06/2023, 1:17 PM

## 2023-05-06 NOTE — Assessment & Plan Note (Signed)
 Hgb 7.7 on presentation. Felt ot be symptaomtic (faitgue) .getting 1 unit PRBC. Eitiology likley hematuria. However, will send anemia panel in AM to make sure we are not missing occult eitiology

## 2023-05-06 NOTE — ED Notes (Signed)
 CCMD called and pt placed on cardiac monitoring.

## 2023-05-06 NOTE — TOC CM/SW Note (Signed)
 Transition of Care [facility]) - Inpatient Brief Assessment   Patient Details  Name: Lauren Mccann MRN: [id number] Date of Birth: 10-15-1930  Transition of Care [facility] CM/SW Contact:    Chapman Fitch, RN Phone Number: 05/06/2023, 1:57 PM   Clinical Narrative:  Met patient at bedside. Denied need for TOC needs at discharge   Transition of Care Asessment: Insurance and Status: Insurance coverage has been reviewed Patient has primary care physician: Yes     Prior/Current Home Services: No current home services Social Drivers of Health Review: SDOH reviewed no interventions necessary Readmission risk has been reviewed: Yes Transition of care needs: no transition of care needs at this time

## 2023-05-06 NOTE — Assessment & Plan Note (Addendum)
 Reported by patient 4-5 days. Not felt to be exsanguinating. But likely contributing to anemia. CT abd pelvis pending. Defer anticoagulation for now till we have CT abd pelvis and hgb stabilised. Will monitor I/o to attemtp to quantify this. Maintain on telemetry for now.

## 2023-05-06 NOTE — Assessment & Plan Note (Signed)
 Serum Cr at baseline. C.w. clinical moniotirng.

## 2023-05-06 NOTE — Assessment & Plan Note (Signed)
 Hold xarelto as above.

## 2023-05-06 NOTE — Plan of Care (Signed)

## 2023-05-07 ENCOUNTER — Other Ambulatory Visit: Payer: Self-pay | Admitting: Oncology

## 2023-05-07 ENCOUNTER — Telehealth: Payer: Self-pay

## 2023-05-07 ENCOUNTER — Encounter: Payer: Self-pay | Admitting: Oncology

## 2023-05-07 NOTE — Telephone Encounter (Signed)
 Lauren Mccann can you please set up follow up appt MD + Venofer treatments ASAP and notify patient of date and time.

## 2023-05-07 NOTE — Telephone Encounter (Signed)
-----   Message from Chriss Czar sent at 05/07/2023 10:51 AM EST ----- Regarding: REFERRAL- [facility] REFERRAL- [facility] sent to her chart. This is an established patient, last seen 11/28/2020. Dr Larwance Sachs Ihor Gully is referring her for Chronic anemia & Stage 3b chronic kidney disease. Dr Cathie Hoops please advise. HGB is 7.7 last week.

## 2023-05-08 LAB — HAPTOGLOBIN: Haptoglobin: 180 mg/dL (ref 41–333)

## 2023-05-16 ENCOUNTER — Inpatient Hospital Stay: Attending: Oncology | Admitting: Oncology

## 2023-05-16 ENCOUNTER — Inpatient Hospital Stay

## 2023-05-16 ENCOUNTER — Encounter: Payer: Self-pay | Admitting: Oncology

## 2023-05-16 VITALS — BP 128/83 | HR 108 | Temp 97.2°F | Resp 18 | Wt 131.9 lb

## 2023-05-16 VITALS — BP 118/68 | HR 98

## 2023-05-16 DIAGNOSIS — C7A012 Malignant carcinoid tumor of the ileum: Secondary | ICD-10-CM

## 2023-05-16 DIAGNOSIS — Z87448 Personal history of other diseases of urinary system: Secondary | ICD-10-CM | POA: Diagnosis not present

## 2023-05-16 DIAGNOSIS — N189 Chronic kidney disease, unspecified: Secondary | ICD-10-CM

## 2023-05-16 DIAGNOSIS — Z86718 Personal history of other venous thrombosis and embolism: Secondary | ICD-10-CM | POA: Insufficient documentation

## 2023-05-16 DIAGNOSIS — D508 Other iron deficiency anemias: Secondary | ICD-10-CM | POA: Diagnosis not present

## 2023-05-16 DIAGNOSIS — D509 Iron deficiency anemia, unspecified: Secondary | ICD-10-CM | POA: Insufficient documentation

## 2023-05-16 DIAGNOSIS — D3A8 Other benign neuroendocrine tumors: Secondary | ICD-10-CM

## 2023-05-16 MED ORDER — SODIUM CHLORIDE 0.9% FLUSH
10.0000 mL | Freq: Once | INTRAVENOUS | Status: AC | PRN
Start: 2023-05-16 — End: 2023-05-16
  Administered 2023-05-16: 10 mL
  Filled 2023-05-16: qty 10

## 2023-05-16 MED ORDER — IRON SUCROSE 20 MG/ML IV SOLN
200.0000 mg | Freq: Once | INTRAVENOUS | Status: AC
  Administered 2023-05-16: 200 mg via INTRAVENOUS

## 2023-05-16 NOTE — Assessment & Plan Note (Signed)
 Follow up with urology

## 2023-05-16 NOTE — Assessment & Plan Note (Addendum)
#  Remote history of neuroendocrine tumor- 2014.   She received Sandostatin LAR 30 mg every 4 weeks for about 2 years. Patient is more than 10 years after her diagnosis.  Recent CT abdomen pelvis showed no recurrence.

## 2023-05-16 NOTE — Assessment & Plan Note (Addendum)
 Unprovoked thrombosis in 2013.  Status post IVC placement. Weighing thrombosis risk and bleeding risk, I recommend patient to resume anticoagulation with Xarelto 10mg  daily or switch Elqiuis 2.5mg  BID.  Recent bleeding events may be due to supratheraputic level of Xarelto due to impaired kidney function.  Patient and granddaughter will discuss with PCP

## 2023-05-16 NOTE — Assessment & Plan Note (Signed)
 Encourage oral hydration and avoid nephrotoxins.

## 2023-05-16 NOTE — Progress Notes (Signed)
 Hematology/Oncology Progress note [phone] [phone]     Patient Care Team: Kandyce Rud, MD as PCP - General (Family Medicine) Scot Jun, MD (Inactive) (Gastroenterology) Abe People as Physician Assistant Rickard Patience, MD as Consulting Physician (Oncology)   Name of the patient: Lauren Mccann  [id number]  12-06-1930   REASON FOR VISIT Follow-up for anemia   ASSESSMENT & PLAN:   Iron deficiency anemia Iron deficiency anemia Lab Results  Component Value Date   HGB 8.4 (L) 05/06/2023   TIBC 417 05/06/2023   IRONPCTSAT 7 (L) 05/06/2023   FERRITIN 8 (L) 05/06/2023    She has received 1 unit of PRBC transfusion as well as 1 dose of Venofer 200 mg Patient tolerates oral iron supplementation orally. Recommend additional Venofer weekly x 2. She will follow-up shortly to repeat labs for assessment of treatment response.  CKD (chronic kidney disease) Encourage oral hydration and avoid nephrotoxins.    Personal history of venous thrombosis and embolism Unprovoked thrombosis in 2013.  Status post IVC placement. Weighing thrombosis risk and bleeding risk, I recommend patient to resume anticoagulation with Xarelto 10mg  daily or switch Elqiuis 2.5mg  BID.  Recent bleeding events may be due to supratheraputic level of Xarelto due to impaired kidney function.  Patient and granddaughter will discuss with PCP  History of hematuria Follow up with urology  Malignant carcinoid tumor of ileum [facility]) #Remote history of neuroendocrine tumor- 2014.   She received Sandostatin LAR 30 mg every 4 weeks for about 2 years. Patient is more than 10 years after her diagnosis.  Recent CT abdomen pelvis showed no recurrence.   Orders Placed This Encounter  Procedures   CBC with Differential (Cancer Center Only)    Standing Status:   Future    Expected Date:   06/27/2023    Expiration Date:   05/15/2024   Iron and TIBC    Standing Status:   Future     Expected Date:   06/27/2023    Expiration Date:   05/15/2024   Ferritin    Standing Status:   Future    Expected Date:   06/27/2023    Expiration Date:   05/15/2024   Retic Panel    Standing Status:   Future    Expected Date:   06/27/2023    Expiration Date:   05/15/2024   Follow up in 6-8 weeks.  All questions were answered. The patient knows to call the clinic with any problems, questions or concerns.  Rickard Patience, MD, PhD [facility] Hematology Oncology 05/16/2023    INTERVAL HISTORY Lauren Mccann is a 89 y.o. female who has above history reviewed by me today presents for follow up visit for management of anemia. She was last seen by me on 11/28/2020.  She lost follow-up after that and presented to reestablish care. Patient is on chronic anticoagulation with Xarelto for unprovoked PE/DVT diagnosed in 2013.  Patient also has an IVC filter. She was hospitalized from 05/05/2023 - 05/06/2023 due to symptomatic anemia.  Hemoglobin dropped to 7.7 on presentation.  She received 1 unit of PRBC transfusion and 200 mg of IV Venofer.  She tolerated iron infusion with no significant side effects. She also had hematuria.  CT abdomen pelvis negative for renal calculi.  Xarelto was held during hospitalization. Today patient reports feeling slightly fatigued.  No lightheadedness.  She denies chest pain, shortness of breath.  Accompanied by her granddaughter.  Review of Systems  Constitutional:  Positive for  fatigue. Negative for chills and fever.  HENT:   Negative for hearing loss and voice change.   Eyes:  Negative for eye problems.  Respiratory:  Negative for chest tightness, cough and shortness of breath.   Cardiovascular:  Negative for chest pain.  Gastrointestinal:  Negative for abdominal distention, abdominal pain and blood in stool.  Endocrine: Negative for hot flashes.  Genitourinary:  Positive for hematuria. Negative for difficulty urinating.   Musculoskeletal:  Positive for arthralgias.   Skin:  Negative for itching and rash.  Neurological:  Negative for extremity weakness, headaches and light-headedness.  Hematological:  Negative for adenopathy.  Psychiatric/Behavioral:  Negative for confusion.       Allergies  Allergen Reactions   Codeine Shortness Of Breath and Other (See Comments)    GI Upset  Pt states "makes heart flutter and trouble breathing" Other reaction(s): Other (See Comments) Chest Tightness, SOB, Tachycardia GI Upset  Pt states "makes heart flutter and trouble breathing" Other reaction(s): Other (See Comments) Chest Tightness, SOB, Tachycardia GI Upset  Pt states "makes heart flutter and trouble breathing"   Duloxetine Shortness Of Breath   Propoxyphene Shortness Of Breath    Other reaction(s): Other (See Comments) GI Upset   Alendronate Sodium     Other reaction(s): Other (See Comments) GI Upset   Atorvastatin     Other reaction(s): Other (See Comments) Myalgia   Benadryl [Diphenhydramine Hcl] Other (See Comments)    Restless legs   Cefuroxime Axetil Nausea Only   Cymbalta [Duloxetine Hcl]    Diphenhydramine    Lipitor [Atorvastatin Calcium]    Other     Other reaction(s): Other (See Comments) GI Upset   Prednisone     Other reaction(s): Other (See Comments) Chest Pain, SOB, "can't breathe good" Unable to sleep   Sulfa Antibiotics Diarrhea   Levofloxacin Rash and Other (See Comments)    Altered mental status     Past Medical History:  Diagnosis Date   A-fib (HCC)    Anemia    Anxiety    Arthritis    neck, back, knees   Breast cancer (HCC) 03/11/2014   Overview:  S/p right mastectomy.  Reoccurrence s/p left matstectomy.   Cancer [facility])    breast   Closed fracture of neck of left femur with routine healing 08/07/2014   Depression    Dyspnea    easily   Fibromyalgia    GERD (gastroesophageal reflux disease)    Headache    everyday   Hip fracture requiring operative repair (HCC) 07/04/2014   History of neck problems     shots in neck every three months   Hypertension    Hyperthyroidism    IBS (irritable bowel syndrome)    Neuropathy    Osteoporosis    Pulmonary embolism (HCC)    H/O   Stroke (HCC)    in past no residual effects   Thyroid disease    Tremor of both hands    Vertigo    worse in past     Past Surgical History:  Procedure Laterality Date   ABDOMINAL HYSTERECTOMY     CATARACT EXTRACTION     CATARACT EXTRACTION W/PHACO Right 08/18/2017   Procedure: CATARACT EXTRACTION PHACO AND INTRAOCULAR LENS PLACEMENT (IOC) right;  Surgeon: Nevada Crane, MD;  Location: [facility];  Service: Ophthalmology;  Laterality: Right;   CHOLECYSTECTOMY     COLON SURGERY     resection   HIP PINNING,CANNULATED Left 07/03/2014   Procedure: CANNULATED HIP PINNING;  Surgeon: Danelle Earthly, MD;  Location: [facility] ORS;  Service: Orthopedics;  Laterality: Left;   MASTECTOMY Bilateral    VENA CAVA FILTER PLACEMENT      Social History   Socioeconomic History   Marital status: Single    Spouse name: Not on file   Number of children: Not on file   Years of education: Not on file   Highest education level: Not on file  Occupational History   Not on file  Tobacco Use   Smoking status: Never   Smokeless tobacco: Never  Vaping Use   Vaping status: Not on file  Substance and Sexual Activity   Alcohol use: No   Drug use: No   Sexual activity: Not on file  Other Topics Concern   Not on file  Social History Narrative   Not on file   Social Drivers of Health   Financial Resource Strain: Low Risk  (05/05/2023)   Received from [facility]   Overall Financial Resource Strain (CARDIA)    Difficulty of Paying Living Expenses: Not very hard  Food Insecurity: No Food Insecurity (05/06/2023)   Hunger Vital Sign    Worried About Running Out of Food in the Last Year: Never true    Ran Out of Food in the Last Year: Never true  Transportation Needs: No Transportation Needs (05/06/2023)    PRAPARE - Administrator, Civil Service (Medical): No    Lack of Transportation (Non-Medical): No  Physical Activity: Not on file  Stress: Not on file  Social Connections: Socially Isolated (05/06/2023)   Social Connection and Isolation Panel [NHANES]    Frequency of Communication with Friends and Family: Three times a week    Frequency of Social Gatherings with Friends and Family: Three times a week    Attends Religious Services: Never    Active Member of Clubs or Organizations: No    Attends Banker Meetings: Never    Marital Status: Divorced  Catering manager Violence: Not At Risk (05/06/2023)   Humiliation, Afraid, Rape, and Kick questionnaire    Fear of Current or Ex-Partner: No    Emotionally Abused: No    Physically Abused: No    Sexually Abused: No    Family History  Problem Relation Age of Onset   Cancer Father        Bone   Kidney cancer Neg Hx    Prostate cancer Neg Hx    Bladder Cancer Neg Hx      Current Outpatient Medications:    acetaminophen (TYLENOL) 325 MG tablet, Take 650 mg by mouth every 4 (four) hours as needed for mild pain or fever. , Disp: , Rfl:    ALPRAZolam (XANAX) 1 MG tablet, Take 0.5 mg by mouth 2 (two) times daily as needed for anxiety. , Disp: , Rfl:    ferrous sulfate 325 (65 FE) MG tablet, Take 1 tablet (325 mg total) by mouth in the morning, at noon, and at bedtime., Disp: 90 tablet, Rfl: 3   levothyroxine (SYNTHROID, LEVOTHROID) 25 MCG tablet, Take 25 mcg by mouth daily. , Disp: , Rfl:    loperamide (IMODIUM) 2 MG capsule, Take 2-4 mg by mouth 2 (two) times daily as needed for diarrhea or loose stools., Disp: , Rfl:    metoprolol succinate (TOPROL-XL) 50 MG 24 hr tablet, Take 50 mg by mouth daily., Disp: , Rfl:    nortriptyline (PAMELOR) 50 MG capsule, Take by mouth., Disp: , Rfl:  oxybutynin (DITROPAN-XL) 10 MG 24 hr tablet, Take 10 mg by mouth daily., Disp: , Rfl:    pantoprazole (PROTONIX) 20 MG tablet, Take 20 mg  by mouth daily., Disp: , Rfl:    QUEtiapine (SEROQUEL) 25 MG tablet, Take by mouth., Disp: , Rfl:    [Paused] rivaroxaban (XARELTO) 20 MG TABS tablet, Take 1 tablet (20 mg total) by mouth daily., Disp: 30 tablet, Rfl: 1   sertraline (ZOLOFT) 100 MG tablet, Take 150 mg by mouth daily. , Disp: , Rfl:    vitamin B-12 1000 MCG tablet, Take 1 tablet (1,000 mcg total) by mouth daily., Disp: 90 tablet, Rfl: 0  Physical exam:  Vitals:   05/16/23 1056  BP: 128/83  Pulse: (!) 108  Resp: 18  Temp: (!) 97.2 F (36.2 C)  TempSrc: Tympanic  SpO2: 98%  Weight: 131 lb 14.4 oz (59.8 kg)   Physical Exam Constitutional:      General: She is not in acute distress.    Appearance: She is obese.     Comments: Patient walks with a walker.  HENT:     Head: Normocephalic and atraumatic.  Eyes:     General: No scleral icterus. Cardiovascular:     Rate and Rhythm: Normal rate and regular rhythm.     Heart sounds: Normal heart sounds.  Pulmonary:     Effort: Pulmonary effort is normal. No respiratory distress.     Breath sounds: No wheezing.  Abdominal:     General: Bowel sounds are normal. There is no distension.     Palpations: Abdomen is soft.  Musculoskeletal:        General: No deformity. Normal range of motion.     Cervical back: Normal range of motion and neck supple.  Skin:    Coloration: Skin is pale.     Findings: No erythema or rash.  Neurological:     Mental Status: She is alert and oriented to person, place, and time. Mental status is at baseline.  Psychiatric:        Mood and Affect: Mood normal.        Latest Ref Rng & Units 05/06/2023    3:59 AM  CMP  Glucose 70 - 99 mg/dL 91   BUN 8 - 23 mg/dL 18   Creatinine 0.34 - 1.00 mg/dL 7.42   Sodium 595 - 638 mmol/L 138   Potassium 3.5 - 5.1 mmol/L 3.8   Chloride 98 - 111 mmol/L 106   CO2 22 - 32 mmol/L 24   Calcium 8.9 - 10.3 mg/dL 9.0       Latest Ref Rng & Units 05/06/2023    3:59 AM  CBC  WBC 4.0 - 10.5 K/uL 6.5    Hemoglobin 12.0 - 15.0 g/dL 8.4   Hematocrit 75.6 - 46.0 % 26.6   Platelets 150 - 400 K/uL 279     RADIOGRAPHIC STUDIES: I have personally reviewed the radiological images as listed and agreed with the findings in the report. DG Chest Port 1 View Result Date: 05/06/2023 CLINICAL DATA:  Fatigue EXAM: PORTABLE CHEST 1 VIEW COMPARISON:  04/28/2019 FINDINGS: Cardiac shadow is stable. Aortic calcifications are noted. The lungs are well aerated bilaterally. No focal infiltrate or effusion is seen. No bony abnormality is noted. IMPRESSION: No acute abnormality noted. Electronically Signed   By: Alcide Clever M.D.   On: 05/06/2023 02:20   CT ABDOMEN PELVIS W CONTRAST Result Date: 05/06/2023 CLINICAL DATA:  Acute abdominal pain EXAM: CT ABDOMEN AND PELVIS  WITH CONTRAST TECHNIQUE: Multidetector CT imaging of the abdomen and pelvis was performed using the standard protocol following bolus administration of intravenous contrast. RADIATION DOSE REDUCTION: This exam was performed according to the departmental dose-optimization program which includes automated exposure control, adjustment of the mA and/or kV according to patient size and/or use of iterative reconstruction technique. CONTRAST:  75mL OMNIPAQUE IOHEXOL 300 MG/ML  SOLN COMPARISON:  None Available. FINDINGS: Lower chest: 4 mm nodule is noted in the lingula best seen on image number 2 of series 5. Hepatobiliary: No focal liver abnormality is seen. Status post cholecystectomy. No biliary dilatation. Pancreas: Unremarkable. No pancreatic ductal dilatation or surrounding inflammatory changes. Spleen: Normal in size without focal abnormality. Adrenals/Urinary Tract: Adrenal glands are within normal limits. Kidneys demonstrate a normal enhancement pattern with the exception of a simple cyst in the midportion of the left kidney. This is stable from the prior exam. No further follow-up is recommended. No renal calculi or obstructive changes are seen. The bladder is  decompressed. Stomach/Bowel: Scattered diverticular change of the sigmoid colon is noted. No findings to suggest diverticulitis are noted. Postsurgical changes in the right colon are seen. No obstructive changes at the anastomosis are noted. Small bowel and stomach are within normal limits with the exception of a large sliding-type hiatal hernia. Vascular/Lymphatic: Aortic atherosclerosis. No enlarged abdominal or pelvic lymph nodes. IVC filter is noted in place. Reproductive: Status post hysterectomy. No adnexal masses. Other: No abdominal wall hernia or abnormality. No abdominopelvic ascites. Musculoskeletal: No acute or significant osseous findings. IMPRESSION: Diverticulosis without diverticulitis. Large sliding-type hiatal hernia. 4 mm nodule in the lingula. No follow-up needed if patient is low-risk. Non-contrast chest CT can be considered in 12 months if patient is high-risk. This recommendation follows the consensus statement: Guidelines for Management of Incidental Pulmonary Nodules Detected on CT Images: From the Fleischner Society 2017; Radiology 2017; 284:228-243. Electronically Signed   By: Alcide Clever M.D.   On: 05/06/2023 00:32

## 2023-05-16 NOTE — Assessment & Plan Note (Signed)
 Iron deficiency anemia Lab Results  Component Value Date   HGB 8.4 (L) 05/06/2023   TIBC 417 05/06/2023   IRONPCTSAT 7 (L) 05/06/2023   FERRITIN 8 (L) 05/06/2023    She has received 1 unit of PRBC transfusion as well as 1 dose of Venofer 200 mg Patient tolerates oral iron supplementation orally. Recommend additional Venofer weekly x 2. She will follow-up shortly to repeat labs for assessment of treatment response.

## 2023-05-19 NOTE — Telephone Encounter (Signed)
 Called pt to go over PTNS, pt states she doesn't have good transportation at the moment, to come for weekly PTNS visits.py states she will give [place] a call whenever she decides what OAB treatment she would like go start on.

## 2023-05-23 ENCOUNTER — Inpatient Hospital Stay

## 2023-05-23 VITALS — BP 107/58 | HR 80 | Temp 98.0°F | Resp 18

## 2023-05-23 DIAGNOSIS — D509 Iron deficiency anemia, unspecified: Secondary | ICD-10-CM | POA: Diagnosis not present

## 2023-05-23 DIAGNOSIS — D3A8 Other benign neuroendocrine tumors: Secondary | ICD-10-CM

## 2023-05-23 MED ORDER — IRON SUCROSE 20 MG/ML IV SOLN
200.0000 mg | Freq: Once | INTRAVENOUS | Status: AC
  Administered 2023-05-23: 200 mg via INTRAVENOUS
  Filled 2023-05-23: qty 10

## 2023-05-23 MED ORDER — SODIUM CHLORIDE 0.9% FLUSH
10.0000 mL | Freq: Once | INTRAVENOUS | Status: AC | PRN
Start: 2023-05-23 — End: 2023-05-23
  Administered 2023-05-23: 10 mL
  Filled 2023-05-23: qty 10

## 2023-05-30 ENCOUNTER — Inpatient Hospital Stay

## 2023-06-25 ENCOUNTER — Inpatient Hospital Stay: Attending: Oncology

## 2023-06-25 DIAGNOSIS — D509 Iron deficiency anemia, unspecified: Secondary | ICD-10-CM | POA: Diagnosis present

## 2023-06-25 DIAGNOSIS — D508 Other iron deficiency anemias: Secondary | ICD-10-CM

## 2023-06-25 LAB — CBC WITH DIFFERENTIAL (CANCER CENTER ONLY)
Abs Immature Granulocytes: 0.02 10*3/uL (ref 0.00–0.07)
Basophils Absolute: 0 10*3/uL (ref 0.0–0.1)
Basophils Relative: 1 %
Eosinophils Absolute: 0.2 10*3/uL (ref 0.0–0.5)
Eosinophils Relative: 3 %
HCT: 31.8 % — ABNORMAL LOW (ref 36.0–46.0)
Hemoglobin: 10 g/dL — ABNORMAL LOW (ref 12.0–15.0)
Immature Granulocytes: 0 %
Lymphocytes Relative: 32 %
Lymphs Abs: 2.3 10*3/uL (ref 0.7–4.0)
MCH: 28.2 pg (ref 26.0–34.0)
MCHC: 31.4 g/dL (ref 30.0–36.0)
MCV: 89.8 fL (ref 80.0–100.0)
Monocytes Absolute: 0.9 10*3/uL (ref 0.1–1.0)
Monocytes Relative: 12 %
Neutro Abs: 3.8 10*3/uL (ref 1.7–7.7)
Neutrophils Relative %: 52 %
Platelet Count: 254 10*3/uL (ref 150–400)
RBC: 3.54 MIL/uL — ABNORMAL LOW (ref 3.87–5.11)
RDW: 19.9 % — ABNORMAL HIGH (ref 11.5–15.5)
WBC Count: 7.2 10*3/uL (ref 4.0–10.5)
nRBC: 0 % (ref 0.0–0.2)

## 2023-06-25 LAB — RETIC PANEL
Immature Retic Fract: 3.1 % (ref 2.3–15.9)
RBC.: 3.56 MIL/uL — ABNORMAL LOW (ref 3.87–5.11)
Retic Count, Absolute: 31.3 10*3/uL (ref 19.0–186.0)
Retic Ct Pct: 0.9 % (ref 0.4–3.1)
Reticulocyte Hemoglobin: 32.5 pg (ref >27.9)

## 2023-06-25 LAB — IRON AND TIBC
Iron: 72 ug/dL (ref 28–170)
Saturation Ratios: 21 % (ref 10.4–31.8)
TIBC: 350 ug/dL (ref 250–450)
UIBC: 278 ug/dL

## 2023-06-25 LAB — FERRITIN: Ferritin: 85 ng/mL (ref 11–307)

## 2023-06-27 ENCOUNTER — Inpatient Hospital Stay

## 2023-06-27 ENCOUNTER — Inpatient Hospital Stay: Admitting: Oncology

## 2023-06-27 ENCOUNTER — Encounter: Payer: Self-pay | Admitting: Oncology

## 2023-06-27 DIAGNOSIS — Z853 Personal history of malignant neoplasm of breast: Secondary | ICD-10-CM

## 2023-06-27 DIAGNOSIS — Z86718 Personal history of other venous thrombosis and embolism: Secondary | ICD-10-CM | POA: Diagnosis not present

## 2023-06-27 DIAGNOSIS — C7A012 Malignant carcinoid tumor of the ileum: Secondary | ICD-10-CM

## 2023-06-27 DIAGNOSIS — N189 Chronic kidney disease, unspecified: Secondary | ICD-10-CM | POA: Diagnosis not present

## 2023-06-27 DIAGNOSIS — D509 Iron deficiency anemia, unspecified: Secondary | ICD-10-CM | POA: Diagnosis not present

## 2023-06-27 DIAGNOSIS — Z808 Family history of malignant neoplasm of other organs or systems: Secondary | ICD-10-CM

## 2023-06-27 DIAGNOSIS — Z7901 Long term (current) use of anticoagulants: Secondary | ICD-10-CM

## 2023-06-27 DIAGNOSIS — M25552 Pain in left hip: Secondary | ICD-10-CM

## 2023-06-27 DIAGNOSIS — D3A8 Other benign neuroendocrine tumors: Secondary | ICD-10-CM

## 2023-06-27 DIAGNOSIS — Z9011 Acquired absence of right breast and nipple: Secondary | ICD-10-CM

## 2023-06-27 DIAGNOSIS — D508 Other iron deficiency anemias: Secondary | ICD-10-CM

## 2023-06-27 NOTE — Assessment & Plan Note (Deleted)
 Iron  deficiency anemia Lab Results  Component Value Date   HGB 10.0 (L) 06/25/2023   TIBC 350 06/25/2023   IRONPCTSAT 21 06/25/2023   FERRITIN 85 06/25/2023     Recommend additional Venofer  weekly x 2.

## 2023-06-27 NOTE — Progress Notes (Signed)
 Pt reports extreme pain all of a sudden in left hip where she has surgery 10 years ago.  Pt also report pain in left shoulder. Request to change visit to virtual due to severe pain.  Pt granddaughter is with patient for visit.  Pt reports she is taking xarelto  1/2 a tab daily (10 mg).

## 2023-06-27 NOTE — Progress Notes (Signed)
 HEMATOLOGY-ONCOLOGY TeleHEALTH VISIT PROGRESS NOTE  I connected with Lauren Mccann on 06/27/23  at  9:45 AM EDT by video enabled telemedicine visit and verified that I am speaking with the correct person using two identifiers. I discussed the limitations, risks, security and privacy concerns of performing an evaluation and management service by telemedicine and the availability of in-person appointments. The patient expressed understanding and agreed to proceed.   Other persons participating in the visit and their role in the encounter:  None  Patient's location: Home  Provider's location: office Chief Complaint: anemia. Hip pain   INTERVAL HISTORY Lauren Mccann is a 89 y.o. female who has above history reviewed by me today presents for follow up visit for management of anemia.  She reports having severe left hip pain, requested to change visit to virtual telemedicine visit. Patient now is on Xarelto  10mg  daily.     Review of Systems  Constitutional:  Negative for appetite change, chills, fatigue and fever.  HENT:   Negative for hearing loss and voice change.   Eyes:  Negative for eye problems.  Respiratory:  Negative for chest tightness and cough.   Cardiovascular:  Negative for chest pain.  Gastrointestinal:  Negative for abdominal distention, abdominal pain and blood in stool.  Endocrine: Negative for hot flashes.  Genitourinary:  Negative for difficulty urinating and frequency.   Musculoskeletal:  Positive for arthralgias.  Skin:  Negative for itching and rash.  Neurological:  Negative for extremity weakness.  Hematological:  Negative for adenopathy.  Psychiatric/Behavioral:  Negative for confusion.     Past Medical History:  Diagnosis Date   A-fib (HCC)    Anemia    Anxiety    Arthritis    neck, back, knees   Breast cancer (HCC) 03/11/2014   Overview:  S/p right mastectomy.  Reoccurrence s/p left matstectomy.   Cancer [facility])    breast   Closed fracture of neck  of left femur with routine healing 08/07/2014   Depression    Dyspnea    easily   Fibromyalgia    GERD (gastroesophageal reflux disease)    Headache    everyday   Hip fracture requiring operative repair (HCC) 07/04/2014   History of neck problems    shots in neck every three months   Hypertension    Hyperthyroidism    IBS (irritable bowel syndrome)    Neuropathy    Osteoporosis    Pulmonary embolism (HCC)    H/O   Stroke (HCC)    in past no residual effects   Thyroid  disease    Tremor of both hands    Vertigo    worse in past   Past Surgical History:  Procedure Laterality Date   ABDOMINAL HYSTERECTOMY     CATARACT EXTRACTION     CATARACT EXTRACTION W/PHACO Right 08/18/2017   Procedure: CATARACT EXTRACTION PHACO AND INTRAOCULAR LENS PLACEMENT (IOC) right;  Surgeon: Rosa College, MD;  Location: [facility];  Service: Ophthalmology;  Laterality: Right;   CHOLECYSTECTOMY     COLON SURGERY     resection   HIP PINNING,CANNULATED Left 07/03/2014   Procedure: CANNULATED HIP PINNING;  Surgeon: Cesario Collum, MD;  Location: [facility];  Service: Orthopedics;  Laterality: Left;   MASTECTOMY Bilateral    VENA CAVA FILTER PLACEMENT      Family History  Problem Relation Age of Onset   Cancer Father        Bone   Kidney cancer Neg Hx  Prostate cancer Neg Hx    Bladder Cancer Neg Hx     Social History   Socioeconomic History   Marital status: Single    Spouse name: Not on file   Number of children: Not on file   Years of education: Not on file   Highest education level: Not on file  Occupational History   Not on file  Tobacco Use   Smoking status: Never   Smokeless tobacco: Never  Vaping Use   Vaping status: Not on file  Substance and Sexual Activity   Alcohol use: No   Drug use: No   Sexual activity: Not on file  Other Topics Concern   Not on file  Social History Narrative   Not on file   Social Drivers of Health   Financial Resource Strain: Low  Risk  (05/05/2023)   Received from [facility]   Overall Financial Resource Strain (CARDIA)    Difficulty of Paying Living Expenses: Not very hard  Food Insecurity: No Food Insecurity (05/06/2023)   Hunger Vital Sign    Worried About Running Out of Food in the Last Year: Never true    Ran Out of Food in the Last Year: Never true  Transportation Needs: No Transportation Needs (05/06/2023)   PRAPARE - Administrator, Civil Service (Medical): No    Lack of Transportation (Non-Medical): No  Physical Activity: Not on file  Stress: Not on file  Social Connections: Socially Isolated (05/06/2023)   Social Connection and Isolation Panel [NHANES]    Frequency of Communication with Friends and Family: Three times a week    Frequency of Social Gatherings with Friends and Family: Three times a week    Attends Religious Services: Never    Active Member of Clubs or Organizations: No    Attends Banker Meetings: Never    Marital Status: Divorced  Catering manager Violence: Not At Risk (05/06/2023)   Humiliation, Afraid, Rape, and Kick questionnaire    Fear of Current or Ex-Partner: No    Emotionally Abused: No    Physically Abused: No    Sexually Abused: No    Current Outpatient Medications on File Prior to Visit  Medication Sig Dispense Refill   acetaminophen  (TYLENOL ) 325 MG tablet Take 650 mg by mouth every 4 (four) hours as needed for mild pain or fever.      ALPRAZolam  (XANAX ) 1 MG tablet Take 0.5 mg by mouth 2 (two) times daily as needed for anxiety.      levothyroxine  (SYNTHROID , LEVOTHROID) 25 MCG tablet Take 25 mcg by mouth daily.      metoprolol  succinate (TOPROL -XL) 50 MG 24 hr tablet Take 50 mg by mouth daily.     nortriptyline (PAMELOR) 50 MG capsule Take by mouth.     oxybutynin (DITROPAN-XL) 10 MG 24 hr tablet Take 10 mg by mouth daily.     pantoprazole  (PROTONIX ) 20 MG tablet Take 20 mg by mouth daily.     QUEtiapine (SEROQUEL) 25 MG tablet Take  by mouth.     [Paused] rivaroxaban  (XARELTO ) 20 MG TABS tablet Take 1 tablet (20 mg total) by mouth daily. (Patient taking differently: Take 20 mg by mouth daily. Pt reports taking 1/2 tablet daily) 30 tablet 1   sertraline  (ZOLOFT ) 100 MG tablet Take 150 mg by mouth daily.      vitamin B-12 1000 MCG tablet Take 1 tablet (1,000 mcg total) by mouth daily. 90 tablet 0   ferrous sulfate   325 (65 FE) MG tablet Take 1 tablet (325 mg total) by mouth in the morning, at noon, and at bedtime. (Patient not taking: Reported on 06/27/2023) 90 tablet 3   loperamide  (IMODIUM ) 2 MG capsule Take 2-4 mg by mouth 2 (two) times daily as needed for diarrhea or loose stools. (Patient not taking: Reported on 06/27/2023)     No current facility-administered medications on file prior to visit.    Allergies  Allergen Reactions   Codeine Shortness Of Breath and Other (See Comments)    GI Upset  Pt states "makes heart flutter and trouble breathing" Other reaction(s): Other (See Comments) Chest Tightness, SOB, Tachycardia GI Upset  Pt states "makes heart flutter and trouble breathing" Other reaction(s): Other (See Comments) Chest Tightness, SOB, Tachycardia GI Upset  Pt states "makes heart flutter and trouble breathing"   Duloxetine Shortness Of Breath   Propoxyphene Shortness Of Breath    Other reaction(s): Other (See Comments) GI Upset   Alendronate Sodium     Other reaction(s): Other (See Comments) GI Upset   Atorvastatin     Other reaction(s): Other (See Comments) Myalgia   Benadryl [Diphenhydramine Hcl] Other (See Comments)    Restless legs   Cefuroxime Axetil Nausea Only   Cymbalta [Duloxetine Hcl]    Diphenhydramine    Lipitor [Atorvastatin Calcium]    Other     Other reaction(s): Other (See Comments) GI Upset   Prednisone     Other reaction(s): Other (See Comments) Chest Pain, SOB, "can't breathe good" Unable to sleep   Sulfa  Antibiotics Diarrhea   Levofloxacin Rash and Other (See Comments)     Altered mental status       Observations/Objective: Today's Vitals   06/27/23 0942  PainSc: 10-Worst pain ever   There is no height or weight on file to calculate BMI.  Physical Exam Neurological:     Mental Status: She is alert.     CBC    Component Value Date/Time   WBC 7.2 06/25/2023 1112   WBC 6.5 05/06/2023 0359   RBC 3.54 (L) 06/25/2023 1112   RBC 3.56 (L) 06/25/2023 1112   HGB 10.0 (L) 06/25/2023 1112   HGB 9.4 (L) 07/02/2014 1755   HCT 31.8 (L) 06/25/2023 1112   HCT 30.0 (L) 07/02/2014 1755   PLT 254 06/25/2023 1112   PLT 277 07/02/2014 1755   MCV 89.8 06/25/2023 1112   MCV 79 (L) 07/02/2014 1755   MCH 28.2 06/25/2023 1112   MCHC 31.4 06/25/2023 1112   RDW 19.9 (H) 06/25/2023 1112   RDW 18.0 (H) 07/02/2014 1755   LYMPHSABS 2.3 06/25/2023 1112   LYMPHSABS 2.7 06/30/2014 1352   MONOABS 0.9 06/25/2023 1112   MONOABS 1.2 (H) 06/30/2014 1352   EOSABS 0.2 06/25/2023 1112   EOSABS 0.2 06/30/2014 1352   BASOSABS 0.0 06/25/2023 1112   BASOSABS 0.1 06/30/2014 1352    CMP     Component Value Date/Time   NA 138 05/06/2023 0359   NA 138 07/02/2014 1755   K 3.8 05/06/2023 0359   K 4.2 07/02/2014 1755   CL 106 05/06/2023 0359   CL 103 07/02/2014 1755   CO2 24 05/06/2023 0359   CO2 26 07/02/2014 1755   GLUCOSE 91 05/06/2023 0359   GLUCOSE 123 (H) 07/02/2014 1755   BUN 18 05/06/2023 0359   BUN 16 04/17/2016 1318   BUN 16 07/02/2014 1755   CREATININE 0.91 05/06/2023 0359   CREATININE 0.98 07/02/2014 1755   CALCIUM 9.0 05/06/2023 0359  CALCIUM 9.0 07/02/2014 1755   PROT 7.6 05/05/2023 1546   PROT 7.6 06/30/2014 1352   ALBUMIN 3.9 05/05/2023 1546   ALBUMIN 3.8 06/30/2014 1352   AST 21 05/05/2023 1546   AST 21 06/30/2014 1352   ALT 12 05/05/2023 1546   ALT 13 (L) 06/30/2014 1352   ALKPHOS 51 05/05/2023 1546   ALKPHOS 67 06/30/2014 1352   BILITOT 0.6 05/05/2023 1546   BILITOT 0.2 (L) 06/30/2014 1352   GFRNONAA 60 (L) 05/06/2023 0359   GFRNONAA 54 (L)  07/02/2014 1755   GFRAA 54 (L) 04/30/2019 0332   GFRAA >60 07/02/2014 1755    ASSESSMENT & PLAN:   Iron  deficiency anemia Iron  deficiency anemia Lab Results  Component Value Date   HGB 10.0 (L) 06/25/2023   TIBC 350 06/25/2023   IRONPCTSAT 21 06/25/2023   FERRITIN 85 06/25/2023     Recommend additional Venofer  weekly x1 to further improve iron  store   CKD (chronic kidney disease) Encourage oral hydration and avoid nephrotoxins.    Personal history of venous thrombosis and embolism Unprovoked thrombosis in 2013.  Status post IVC placement. Continue Xarelto  10mg  daily   Orders Placed This Encounter  Procedures   CBC with Differential (Cancer Center Only)    Standing Status:   Future    Expected Date:   09/26/2023    Expiration Date:   06/26/2024   Iron  and TIBC([facility])    Standing Status:   Future    Expected Date:   09/26/2023    Expiration Date:   06/26/2024   Ferritin    Standing Status:   Future    Expected Date:   09/26/2023    Expiration Date:   06/26/2024   Retic Panel    Standing Status:   Future    Expected Date:   09/26/2023    Expiration Date:   06/26/2024    Timmy Forbes, MD 06/27/2023 1:28 PM

## 2023-06-27 NOTE — Addendum Note (Signed)
 Addended by: Timmy Forbes on: 06/27/2023 01:49 PM   Modules accepted: Level of Service

## 2023-06-27 NOTE — Assessment & Plan Note (Signed)
 Unprovoked thrombosis in 2013.  Status post IVC placement. Continue Xarelto  10mg  daily

## 2023-06-27 NOTE — Assessment & Plan Note (Signed)
 Iron  deficiency anemia Lab Results  Component Value Date   HGB 10.0 (L) 06/25/2023   TIBC 350 06/25/2023   IRONPCTSAT 21 06/25/2023   FERRITIN 85 06/25/2023     Recommend additional Venofer  weekly x1 to further improve iron  store

## 2023-06-27 NOTE — Assessment & Plan Note (Signed)
 Encourage oral hydration and avoid nephrotoxins.

## 2023-07-03 ENCOUNTER — Inpatient Hospital Stay

## 2023-07-16 ENCOUNTER — Ambulatory Visit

## 2023-07-16 ENCOUNTER — Ambulatory Visit: Admitting: Oncology

## 2023-09-15 ENCOUNTER — Inpatient Hospital Stay: Attending: Oncology

## 2023-09-15 DIAGNOSIS — D508 Other iron deficiency anemias: Secondary | ICD-10-CM

## 2023-09-15 DIAGNOSIS — D3A8 Other benign neuroendocrine tumors: Secondary | ICD-10-CM

## 2023-09-15 DIAGNOSIS — D509 Iron deficiency anemia, unspecified: Secondary | ICD-10-CM | POA: Insufficient documentation

## 2023-09-15 DIAGNOSIS — C7A012 Malignant carcinoid tumor of the ileum: Secondary | ICD-10-CM

## 2023-09-15 LAB — CBC WITH DIFFERENTIAL (CANCER CENTER ONLY)
Abs Immature Granulocytes: 0.11 K/uL — ABNORMAL HIGH (ref 0.00–0.07)
Basophils Absolute: 0 K/uL (ref 0.0–0.1)
Basophils Relative: 0 %
Eosinophils Absolute: 0.2 K/uL (ref 0.0–0.5)
Eosinophils Relative: 2 %
HCT: 30.2 % — ABNORMAL LOW (ref 36.0–46.0)
Hemoglobin: 9.7 g/dL — ABNORMAL LOW (ref 12.0–15.0)
Immature Granulocytes: 1 %
Lymphocytes Relative: 26 %
Lymphs Abs: 2.4 K/uL (ref 0.7–4.0)
MCH: 30.4 pg (ref 26.0–34.0)
MCHC: 32.1 g/dL (ref 30.0–36.0)
MCV: 94.7 fL (ref 80.0–100.0)
Monocytes Absolute: 1.1 K/uL — ABNORMAL HIGH (ref 0.1–1.0)
Monocytes Relative: 12 %
Neutro Abs: 5.5 K/uL (ref 1.7–7.7)
Neutrophils Relative %: 59 %
Platelet Count: 274 K/uL (ref 150–400)
RBC: 3.19 MIL/uL — ABNORMAL LOW (ref 3.87–5.11)
RDW: 12.9 % (ref 11.5–15.5)
WBC Count: 9.4 K/uL (ref 4.0–10.5)
nRBC: 0 % (ref 0.0–0.2)

## 2023-09-15 LAB — IRON AND TIBC
Iron: 32 ug/dL (ref 28–170)
Saturation Ratios: 7 % — ABNORMAL LOW (ref 10.4–31.8)
TIBC: 430 ug/dL (ref 250–450)
UIBC: 398 ug/dL

## 2023-09-15 LAB — FERRITIN: Ferritin: 13 ng/mL (ref 11–307)

## 2023-09-15 LAB — RETIC PANEL
Immature Retic Fract: 3 % (ref 2.3–15.9)
RBC.: 3.24 MIL/uL — ABNORMAL LOW (ref 3.87–5.11)
Retic Count, Absolute: 28.5 K/uL (ref 19.0–186.0)
Retic Ct Pct: 0.9 % (ref 0.4–3.1)
Reticulocyte Hemoglobin: 30.8 pg (ref >27.9)

## 2023-09-24 ENCOUNTER — Other Ambulatory Visit

## 2023-10-01 ENCOUNTER — Encounter: Payer: Self-pay | Admitting: Oncology

## 2023-10-01 ENCOUNTER — Inpatient Hospital Stay: Admitting: Oncology

## 2023-10-01 DIAGNOSIS — C7A012 Malignant carcinoid tumor of the ileum: Secondary | ICD-10-CM | POA: Diagnosis not present

## 2023-10-01 DIAGNOSIS — Z87448 Personal history of other diseases of urinary system: Secondary | ICD-10-CM | POA: Diagnosis not present

## 2023-10-01 DIAGNOSIS — D508 Other iron deficiency anemias: Secondary | ICD-10-CM

## 2023-10-01 DIAGNOSIS — N189 Chronic kidney disease, unspecified: Secondary | ICD-10-CM | POA: Diagnosis not present

## 2023-10-01 DIAGNOSIS — Z86718 Personal history of other venous thrombosis and embolism: Secondary | ICD-10-CM

## 2023-10-01 NOTE — Assessment & Plan Note (Signed)
 Unprovoked thrombosis in 2013.  Status post IVC placement. Continue Xarelto  10mg  daily

## 2023-10-01 NOTE — Assessment & Plan Note (Signed)
#  Remote history of neuroendocrine tumor- 2014.   She received Sandostatin LAR 30 mg every 4 weeks for about 2 years. Patient is more than 10 years after her diagnosis.  Recent CT abdomen pelvis showed no recurrence.

## 2023-10-01 NOTE — Progress Notes (Signed)
 HEMATOLOGY-ONCOLOGY TeleHEALTH VISIT PROGRESS NOTE  I connected with Lauren Mccann on 10/01/23  at  2:45 PM EDT by video enabled telemedicine visit and verified that I am speaking with the correct person using two identifiers. I discussed the limitations, risks, security and privacy concerns of performing an evaluation and management service by telemedicine and the availability of in-person appointments. The patient expressed understanding and agreed to proceed.   Other persons participating in the visit and their role in the encounter:  Granddaughter Rockey with e  Patient's location: Home  Provider's location: office Chief Complaint: anemia. Hip pain   INTERVAL HISTORY Lauren Mccann is a 89 y.o. female who has above history reviewed by me today presents for follow up visit for management of anemia.  She reports having severe left hip pain, she prefers outpatient virtual telemedicine visit. Patient  is on Xarelto  10mg  daily.  Declines rectal bleeding.  Reports pinkish urine color.  Patient is on Macrobid  chronically for suppressive therapy.      Review of Systems  Constitutional:  Negative for appetite change, chills, fatigue and fever.  HENT:   Negative for hearing loss and voice change.   Eyes:  Negative for eye problems.  Respiratory:  Negative for chest tightness and cough.   Cardiovascular:  Negative for chest pain.  Gastrointestinal:  Negative for abdominal distention, abdominal pain and blood in stool.  Endocrine: Negative for hot flashes.  Genitourinary:  Negative for difficulty urinating and frequency.   Musculoskeletal:  Positive for arthralgias.  Skin:  Negative for itching and rash.  Neurological:  Negative for extremity weakness.  Hematological:  Negative for adenopathy.  Psychiatric/Behavioral:  Negative for confusion.     Past Medical History:  Diagnosis Date   A-fib (HCC)    Anemia    Anxiety    Arthritis    neck, back, knees   Breast cancer (HCC)  03/11/2014   Overview:  S/p right mastectomy.  Reoccurrence s/p left matstectomy.   Cancer [facility])    breast   Closed fracture of neck of left femur with routine healing 08/07/2014   Depression    Dyspnea    easily   Fibromyalgia    GERD (gastroesophageal reflux disease)    Headache    everyday   Hip fracture requiring operative repair (HCC) 07/04/2014   History of neck problems    shots in neck every three months   Hypertension    Hyperthyroidism    IBS (irritable bowel syndrome)    Neuropathy    Osteoporosis    Pulmonary embolism (HCC)    H/O   Stroke (HCC)    in past no residual effects   Thyroid  disease    Tremor of both hands    Vertigo    worse in past   Past Surgical History:  Procedure Laterality Date   ABDOMINAL HYSTERECTOMY     CATARACT EXTRACTION     CATARACT EXTRACTION W/PHACO Right 08/18/2017   Procedure: CATARACT EXTRACTION PHACO AND INTRAOCULAR LENS PLACEMENT (IOC) right;  Surgeon: Myrna Adine Anes, MD;  Location: [facility];  Service: Ophthalmology;  Laterality: Right;   CHOLECYSTECTOMY     COLON SURGERY     resection   HIP PINNING,CANNULATED Left 07/03/2014   Procedure: CANNULATED HIP PINNING;  Surgeon: Livingston ONEIDA Mori, MD;  Location: [facility];  Service: Orthopedics;  Laterality: Left;   MASTECTOMY Bilateral    VENA CAVA FILTER PLACEMENT      Family History  Problem Relation Age of Onset  Cancer Father        Bone   Kidney cancer Neg Hx    Prostate cancer Neg Hx    Bladder Cancer Neg Hx     Social History   Socioeconomic History   Marital status: Single    Spouse name: Not on file   Number of children: Not on file   Years of education: Not on file   Highest education level: Not on file  Occupational History   Not on file  Tobacco Use   Smoking status: Never   Smokeless tobacco: Never  Vaping Use   Vaping status: Not on file  Substance and Sexual Activity   Alcohol use: No   Drug use: No   Sexual activity: Not on file  Other  Topics Concern   Not on file  Social History Narrative   Not on file   Social Drivers of Health   Financial Resource Strain: Low Risk  (08/13/2023)   Received from [facility]   Overall Financial Resource Strain (CARDIA)    Difficulty of Paying Living Expenses: Not hard at all  Food Insecurity: No Food Insecurity (08/13/2023)   Received from [facility]   Hunger Vital Sign    Within the past 12 months, you worried that your food would run out before you got the money to buy more.: Never true    Within the past 12 months, the food you bought just didn't last and you didn't have money to get more.: Never true  Transportation Needs: No Transportation Needs (08/13/2023)   Received from [facility]    In the past 12 months, has lack of transportation kept you from medical appointments or from getting medications?: No    Lack of Transportation (Non-Medical): No  Physical Activity: Not on file  Stress: Not on file  Social Connections: Socially Isolated (05/06/2023)   Social Connection and Isolation Panel    Frequency of Communication with Friends and Family: Three times a week    Frequency of Social Gatherings with Friends and Family: Three times a week    Attends Religious Services: Never    Active Member of Clubs or Organizations: No    Attends Banker Meetings: Never    Marital Status: Divorced  Catering manager Violence: Not At Risk (05/06/2023)   Humiliation, Afraid, Rape, and Kick questionnaire    Fear of Current or Ex-Partner: No    Emotionally Abused: No    Physically Abused: No    Sexually Abused: No    Current Outpatient Medications on File Prior to Visit  Medication Sig Dispense Refill   acetaminophen  (TYLENOL ) 325 MG tablet Take 650 mg by mouth every 4 (four) hours as needed for mild pain or fever.      ALPRAZolam  (XANAX ) 1 MG tablet Take 0.5 mg by mouth 2 (two) times daily as needed  for anxiety.      diltiazem  (CARDIZEM  CD) 120 MG 24 hr capsule Take 120 mg by mouth.     ferrous sulfate  325 (65 FE) MG tablet Take 1 tablet (325 mg total) by mouth in the morning, at noon, and at bedtime. 90 tablet 3   levothyroxine  (SYNTHROID , LEVOTHROID) 25 MCG tablet Take 25 mcg by mouth daily.      loperamide  (IMODIUM ) 2 MG capsule Take 2-4 mg by mouth 2 (two) times daily as needed for diarrhea or loose stools.     nitrofurantoin , macrocrystal-monohydrate, (MACROBID ) 100 MG  capsule Take 100 mg by mouth.     nortriptyline (PAMELOR) 50 MG capsule Take by mouth.     oxybutynin (DITROPAN-XL) 10 MG 24 hr tablet Take 10 mg by mouth daily.     pantoprazole  (PROTONIX ) 20 MG tablet Take 20 mg by mouth daily.     QUEtiapine (SEROQUEL) 25 MG tablet Take by mouth.     sertraline  (ZOLOFT ) 100 MG tablet Take 150 mg by mouth daily.      vitamin B-12 1000 MCG tablet Take 1 tablet (1,000 mcg total) by mouth daily. 90 tablet 0   metoprolol  succinate (TOPROL -XL) 50 MG 24 hr tablet Take 50 mg by mouth daily. (Patient not taking: Reported on 10/01/2023)     [Paused] rivaroxaban  (XARELTO ) 20 MG TABS tablet Take 1 tablet (20 mg total) by mouth daily. (Patient taking differently: Take 20 mg by mouth daily. Pt reports taking 1/2 tablet daily) 30 tablet 1   No current facility-administered medications on file prior to visit.    Allergies  Allergen Reactions   Codeine Shortness Of Breath and Other (See Comments)    GI Upset  Pt states makes heart flutter and trouble breathing Other reaction(s): Other (See Comments) Chest Tightness, SOB, Tachycardia GI Upset  Pt states makes heart flutter and trouble breathing Other reaction(s): Other (See Comments) Chest Tightness, SOB, Tachycardia GI Upset  Pt states makes heart flutter and trouble breathing   Duloxetine Shortness Of Breath   Propoxyphene Shortness Of Breath    Other reaction(s): Other (See Comments) GI Upset   Alendronate Sodium     Other  reaction(s): Other (See Comments) GI Upset   Atorvastatin     Other reaction(s): Other (See Comments) Myalgia   Benadryl [Diphenhydramine Hcl] Other (See Comments)    Restless legs   Cefuroxime Axetil Nausea Only   Cymbalta [Duloxetine Hcl]    Diphenhydramine    Lipitor [Atorvastatin Calcium]    Other     Other reaction(s): Other (See Comments) GI Upset   Prednisone     Other reaction(s): Other (See Comments) Chest Pain, SOB, can't breathe good Unable to sleep   Sulfa  Antibiotics Diarrhea   Levofloxacin Rash and Other (See Comments)    Altered mental status       Observations/Objective: There were no vitals filed for this visit.  There is no height or weight on file to calculate BMI.  Physical Exam Neurological:     Mental Status: She is alert.       Latest Ref Rng & Units 09/15/2023   11:32 AM 06/25/2023   11:12 AM 05/06/2023    3:59 AM  CBC  WBC 4.0 - 10.5 K/uL 9.4  7.2  6.5   Hemoglobin 12.0 - 15.0 g/dL 9.7  89.9  8.4   Hematocrit 36.0 - 46.0 % 30.2  31.8  26.6   Platelets 150 - 400 K/uL 274  254  279       Latest Ref Rng & Units 05/06/2023    3:59 AM 05/05/2023    3:46 PM 04/30/2019    3:32 AM  CMP  Glucose 70 - 99 mg/dL 91  895  90   BUN 8 - 23 mg/dL 18  24  13    Creatinine 0.44 - 1.00 mg/dL 9.08  8.83  8.91   Sodium 135 - 145 mmol/L 138  133  139   Potassium 3.5 - 5.1 mmol/L 3.8  4.0  4.7   Chloride 98 - 111 mmol/L 106  100  110  CO2 22 - 32 mmol/L 24  22  22    Calcium 8.9 - 10.3 mg/dL 9.0  9.3  8.7   Total Protein 6.5 - 8.1 g/dL  7.6    Total Bilirubin 0.0 - 1.2 mg/dL  0.6    Alkaline Phos 38 - 126 U/L  51    AST 15 - 41 U/L  21    ALT 0 - 44 U/L  12        ASSESSMENT & PLAN:   Iron  deficiency anemia Iron  deficiency anemia Lab Results  Component Value Date   HGB 9.7 (L) 09/15/2023   TIBC 430 09/15/2023   IRONPCTSAT 7 (L) 09/15/2023   FERRITIN 13 09/15/2023     Recommend additional Venofer   every 2 weeks x 3 to further improve iron   store   CKD (chronic kidney disease) Encourage oral hydration and avoid nephrotoxins.    History of hematuria Follow up with urology  Malignant carcinoid tumor of ileum (HCC) #Remote history of neuroendocrine tumor- 2014.  She received Sandostatin  LAR 30 mg every 4 weeks for about 2 years. Patient is more than 10 years after her diagnosis.  Recent CT abdomen pelvis showed no recurrence.   Personal history of venous thrombosis and embolism Unprovoked thrombosis in 2013.  Status post IVC placement. Continue Xarelto  10mg  daily   Recommend patient continue follow-up with urology urinary symptoms.  Orders Placed This Encounter  Procedures   CBC with Differential (Cancer Center Only)    Standing Status:   Future    Expected Date:   01/01/2024    Expiration Date:   03/31/2024   Iron  and TIBC    Standing Status:   Future    Expected Date:   01/01/2024    Expiration Date:   03/31/2024   Ferritin    Standing Status:   Future    Expected Date:   01/01/2024    Expiration Date:   03/31/2024    Zelphia Cap, MD 10/01/2023 6:32 PM

## 2023-10-01 NOTE — Progress Notes (Signed)
 Pt contacted for follow up. No new concerns voiced.

## 2023-10-01 NOTE — Assessment & Plan Note (Signed)
 Encourage oral hydration and avoid nephrotoxins.

## 2023-10-01 NOTE — Assessment & Plan Note (Signed)
 Follow up with urology

## 2023-10-01 NOTE — Assessment & Plan Note (Addendum)
 Iron  deficiency anemia Lab Results  Component Value Date   HGB 9.7 (L) 09/15/2023   TIBC 430 09/15/2023   IRONPCTSAT 7 (L) 09/15/2023   FERRITIN 13 09/15/2023     Recommend additional Venofer   every 2 weeks x 3 to further improve iron  store

## 2023-10-06 ENCOUNTER — Inpatient Hospital Stay: Attending: Oncology

## 2023-10-06 VITALS — BP 113/77 | HR 108 | Temp 94.0°F | Resp 18

## 2023-10-06 DIAGNOSIS — D509 Iron deficiency anemia, unspecified: Secondary | ICD-10-CM | POA: Diagnosis present

## 2023-10-06 DIAGNOSIS — D3A8 Other benign neuroendocrine tumors: Secondary | ICD-10-CM

## 2023-10-06 MED ORDER — SODIUM CHLORIDE 0.9% FLUSH
10.0000 mL | Freq: Once | INTRAVENOUS | Status: AC | PRN
  Administered 2023-10-06: 10 mL
  Filled 2023-10-06: qty 10

## 2023-10-06 MED ORDER — IRON SUCROSE 20 MG/ML IV SOLN
200.0000 mg | Freq: Once | INTRAVENOUS | Status: AC
  Administered 2023-10-06: 200 mg via INTRAVENOUS
  Filled 2023-10-06: qty 10

## 2023-10-08 ENCOUNTER — Inpatient Hospital Stay

## 2023-10-21 ENCOUNTER — Inpatient Hospital Stay

## 2023-10-21 VITALS — BP 107/70 | HR 101 | Temp 97.6°F | Resp 16

## 2023-10-21 DIAGNOSIS — D509 Iron deficiency anemia, unspecified: Secondary | ICD-10-CM | POA: Diagnosis not present

## 2023-10-21 DIAGNOSIS — D3A8 Other benign neuroendocrine tumors: Secondary | ICD-10-CM

## 2023-10-21 MED ORDER — IRON SUCROSE 20 MG/ML IV SOLN
200.0000 mg | Freq: Once | INTRAVENOUS | Status: AC
  Administered 2023-10-21: 200 mg via INTRAVENOUS
  Filled 2023-10-21: qty 10

## 2023-10-21 NOTE — Patient Instructions (Signed)

## 2023-10-22 ENCOUNTER — Ambulatory Visit

## 2023-11-05 ENCOUNTER — Ambulatory Visit

## 2023-11-07 ENCOUNTER — Inpatient Hospital Stay

## 2023-11-17 ENCOUNTER — Inpatient Hospital Stay: Attending: Oncology

## 2023-11-17 VITALS — BP 116/72 | HR 100 | Temp 95.0°F | Resp 18

## 2023-11-17 DIAGNOSIS — D509 Iron deficiency anemia, unspecified: Secondary | ICD-10-CM | POA: Diagnosis present

## 2023-11-17 DIAGNOSIS — D3A8 Other benign neuroendocrine tumors: Secondary | ICD-10-CM

## 2023-11-17 MED ORDER — SODIUM CHLORIDE 0.9% FLUSH
10.0000 mL | Freq: Once | INTRAVENOUS | Status: AC | PRN
  Administered 2023-11-17: 10 mL
  Filled 2023-11-17: qty 10

## 2023-11-17 MED ORDER — IRON SUCROSE 20 MG/ML IV SOLN
200.0000 mg | Freq: Once | INTRAVENOUS | Status: AC
  Administered 2023-11-17: 200 mg via INTRAVENOUS
  Filled 2023-11-17: qty 10

## 2024-01-05 ENCOUNTER — Inpatient Hospital Stay: Attending: Oncology

## 2024-01-05 ENCOUNTER — Other Ambulatory Visit

## 2024-01-05 DIAGNOSIS — D509 Iron deficiency anemia, unspecified: Secondary | ICD-10-CM | POA: Insufficient documentation

## 2024-01-05 DIAGNOSIS — D508 Other iron deficiency anemias: Secondary | ICD-10-CM

## 2024-01-05 LAB — CBC WITH DIFFERENTIAL (CANCER CENTER ONLY)
Abs Immature Granulocytes: 0.02 K/uL (ref 0.00–0.07)
Basophils Absolute: 0.1 K/uL (ref 0.0–0.1)
Basophils Relative: 1 %
Eosinophils Absolute: 0.8 K/uL — ABNORMAL HIGH (ref 0.0–0.5)
Eosinophils Relative: 9 %
HCT: 34.4 % — ABNORMAL LOW (ref 36.0–46.0)
Hemoglobin: 11 g/dL — ABNORMAL LOW (ref 12.0–15.0)
Immature Granulocytes: 0 %
Lymphocytes Relative: 23 %
Lymphs Abs: 2.1 K/uL (ref 0.7–4.0)
MCH: 29.1 pg (ref 26.0–34.0)
MCHC: 32 g/dL (ref 30.0–36.0)
MCV: 91 fL (ref 80.0–100.0)
Monocytes Absolute: 1.1 K/uL — ABNORMAL HIGH (ref 0.1–1.0)
Monocytes Relative: 12 %
Neutro Abs: 4.9 K/uL (ref 1.7–7.7)
Neutrophils Relative %: 55 %
Platelet Count: 249 K/uL (ref 150–400)
RBC: 3.78 MIL/uL — ABNORMAL LOW (ref 3.87–5.11)
RDW: 14 % (ref 11.5–15.5)
WBC Count: 9 K/uL (ref 4.0–10.5)
nRBC: 0 % (ref 0.0–0.2)

## 2024-01-05 LAB — IRON AND TIBC
Iron: 45 ug/dL (ref 28–170)
Saturation Ratios: 13 % (ref 10.4–31.8)
TIBC: 339 ug/dL (ref 250–450)
UIBC: 294 ug/dL

## 2024-01-05 LAB — FERRITIN: Ferritin: 63 ng/mL (ref 11–307)

## 2024-01-14 ENCOUNTER — Inpatient Hospital Stay: Admitting: Oncology

## 2024-01-14 ENCOUNTER — Telehealth: Payer: Self-pay | Admitting: Oncology

## 2024-01-14 ENCOUNTER — Encounter: Payer: Self-pay | Admitting: Oncology

## 2024-01-14 DIAGNOSIS — Z86718 Personal history of other venous thrombosis and embolism: Secondary | ICD-10-CM

## 2024-01-14 DIAGNOSIS — D508 Other iron deficiency anemias: Secondary | ICD-10-CM

## 2024-01-14 DIAGNOSIS — C7A012 Malignant carcinoid tumor of the ileum: Secondary | ICD-10-CM | POA: Diagnosis not present

## 2024-01-14 DIAGNOSIS — N189 Chronic kidney disease, unspecified: Secondary | ICD-10-CM | POA: Diagnosis not present

## 2024-01-14 DIAGNOSIS — D509 Iron deficiency anemia, unspecified: Secondary | ICD-10-CM | POA: Diagnosis not present

## 2024-01-14 DIAGNOSIS — Z7901 Long term (current) use of anticoagulants: Secondary | ICD-10-CM

## 2024-01-14 DIAGNOSIS — D72821 Monocytosis (symptomatic): Secondary | ICD-10-CM

## 2024-01-14 NOTE — Assessment & Plan Note (Signed)
 Iron  deficiency anemia, she tolerated IV Venofer  treatments well. Lab Results  Component Value Date   HGB 11.0 (L) 01/05/2024   TIBC 339 01/05/2024   IRONPCTSAT 13 01/05/2024   FERRITIN 63 01/05/2024    Hemoglobin has improved to 11.  Ferritin less than 200. Recommend additional Venofer  x 1 to further improve iron  stores.

## 2024-01-14 NOTE — Progress Notes (Addendum)
 HEMATOLOGY-ONCOLOGY TeleHEALTH VISIT PROGRESS NOTE  I connected with Lauren Mccann on 01/14/24  at  3:30 PM EST by video enabled telemedicine visit and verified that I am speaking with the correct person using two identifiers. I discussed the limitations, risks, security and privacy concerns of performing an evaluation and management service by telemedicine and the availability of in-person appointments. The patient expressed understanding and agreed to proceed.   Other persons participating in the visit and their role in the encounter:  Granddaughter Rockey with e  Patient's location: Home  Provider's location: office Chief Complaint: anemia. VTE   INTERVAL HISTORY Lauren Mccann is a 89 y.o. female who has above history reviewed by me today presents for follow up visit for management of anemia.  Patient prefers outpatient virtual telemedicine visit. Patient  is on Xarelto  10mg  daily.  Declines rectal bleeding.   Patient tolerated IV Venofer  treatments well.  She has no new concerns today.      Review of Systems  Constitutional:  Negative for appetite change, chills, fatigue and fever.  HENT:   Negative for hearing loss and voice change.   Eyes:  Negative for eye problems.  Respiratory:  Negative for chest tightness and cough.   Cardiovascular:  Negative for chest pain.  Gastrointestinal:  Negative for abdominal distention, abdominal pain and blood in stool.  Endocrine: Negative for hot flashes.  Genitourinary:  Negative for difficulty urinating and frequency.   Musculoskeletal:  Positive for arthralgias.  Skin:  Negative for itching and rash.  Neurological:  Negative for extremity weakness.  Hematological:  Negative for adenopathy.  Psychiatric/Behavioral:  Negative for confusion.     Past Medical History:  Diagnosis Date   A-fib (HCC)    Anemia    Anxiety    Arthritis    neck, back, knees   Breast cancer (HCC) 03/11/2014   Overview:  S/p right mastectomy.   Reoccurrence s/p left matstectomy.   Cancer [facility])    breast   Closed fracture of neck of left femur with routine healing 08/07/2014   Depression    Dyspnea    easily   Fibromyalgia    GERD (gastroesophageal reflux disease)    Headache    everyday   Hip fracture requiring operative repair (HCC) 07/04/2014   History of neck problems    shots in neck every three months   Hypertension    Hyperthyroidism    IBS (irritable bowel syndrome)    Neuropathy    Osteoporosis    Pulmonary embolism (HCC)    H/O   Stroke (HCC)    in past no residual effects   Thyroid  disease    Tremor of both hands    Vertigo    worse in past   Past Surgical History:  Procedure Laterality Date   ABDOMINAL HYSTERECTOMY     CATARACT EXTRACTION     CATARACT EXTRACTION W/PHACO Right 08/18/2017   Procedure: CATARACT EXTRACTION PHACO AND INTRAOCULAR LENS PLACEMENT (IOC) right;  Surgeon: Myrna Adine Anes, MD;  Location: [facility];  Service: Ophthalmology;  Laterality: Right;   CHOLECYSTECTOMY     COLON SURGERY     resection   HIP PINNING,CANNULATED Left 07/03/2014   Procedure: CANNULATED HIP PINNING;  Surgeon: Livingston ONEIDA Mori, MD;  Location: [facility];  Service: Orthopedics;  Laterality: Left;   MASTECTOMY Bilateral    VENA CAVA FILTER PLACEMENT      Family History  Problem Relation Age of Onset   Cancer Father  Bone   Kidney cancer Neg Hx    Prostate cancer Neg Hx    Bladder Cancer Neg Hx     Social History   Socioeconomic History   Marital status: Single    Spouse name: Not on file   Number of children: Not on file   Years of education: Not on file   Highest education level: Not on file  Occupational History   Not on file  Tobacco Use   Smoking status: Never   Smokeless tobacco: Never  Vaping Use   Vaping status: Not on file  Substance and Sexual Activity   Alcohol use: No   Drug use: No   Sexual activity: Not on file  Other Topics Concern   Not on file  Social History  Narrative   Not on file   Social Drivers of Health   Financial Resource Strain: Low Risk  (08/13/2023)   Received from [facility]   Overall Financial Resource Strain (CARDIA)    Difficulty of Paying Living Expenses: Not hard at all  Food Insecurity: No Food Insecurity (08/13/2023)   Received from [facility]   Hunger Vital Sign    Within the past 12 months, you worried that your food would run out before you got the money to buy more.: Never true    Within the past 12 months, the food you bought just didn't last and you didn't have money to get more.: Never true  Transportation Needs: No Transportation Needs (08/13/2023)   Received from [facility]    In the past 12 months, has lack of transportation kept you from medical appointments or from getting medications?: No    Lack of Transportation (Non-Medical): No  Physical Activity: Not on file  Stress: Not on file  Social Connections: Socially Isolated (05/06/2023)   Social Connection and Isolation Panel    Frequency of Communication with Friends and Family: Three times a week    Frequency of Social Gatherings with Friends and Family: Three times a week    Attends Religious Services: Never    Active Member of Clubs or Organizations: No    Attends Banker Meetings: Never    Marital Status: Divorced  Catering Manager Violence: Not At Risk (05/06/2023)   Humiliation, Afraid, Rape, and Kick questionnaire    Fear of Current or Ex-Partner: No    Emotionally Abused: No    Physically Abused: No    Sexually Abused: No    Current Outpatient Medications on File Prior to Visit  Medication Sig Dispense Refill   ALPRAZolam  (XANAX ) 1 MG tablet Take 0.5 mg by mouth 2 (two) times daily as needed for anxiety.      diltiazem  (CARDIZEM  CD) 120 MG 24 hr capsule Take 120 mg by mouth.     levothyroxine  (SYNTHROID , LEVOTHROID) 25 MCG tablet Take 25 mcg by mouth  daily.      nitrofurantoin , macrocrystal-monohydrate, (MACROBID ) 100 MG capsule Take 100 mg by mouth.     nortriptyline (PAMELOR) 50 MG capsule Take by mouth.     pantoprazole  (PROTONIX ) 20 MG tablet Take 20 mg by mouth daily.     [Paused] rivaroxaban  (XARELTO ) 20 MG TABS tablet Take 1 tablet (20 mg total) by mouth daily. 30 tablet 1   sertraline  (ZOLOFT ) 100 MG tablet Take 150 mg by mouth daily.      traMADol  (ULTRAM ) 50 MG tablet Take 50 mg by mouth daily as needed.  vitamin B-12 1000 MCG tablet Take 1 tablet (1,000 mcg total) by mouth daily. 90 tablet 0   acetaminophen  (TYLENOL ) 325 MG tablet Take 650 mg by mouth every 4 (four) hours as needed for mild pain or fever.  (Patient not taking: Reported on 01/14/2024)     ferrous sulfate  325 (65 FE) MG tablet Take 1 tablet (325 mg total) by mouth in the morning, at noon, and at bedtime. (Patient not taking: Reported on 01/14/2024) 90 tablet 3   loperamide  (IMODIUM ) 2 MG capsule Take 2-4 mg by mouth 2 (two) times daily as needed for diarrhea or loose stools. (Patient not taking: Reported on 01/14/2024)     metoprolol  succinate (TOPROL -XL) 50 MG 24 hr tablet Take 50 mg by mouth daily. (Patient not taking: Reported on 01/14/2024)     oxybutynin (DITROPAN-XL) 10 MG 24 hr tablet Take 10 mg by mouth daily. (Patient not taking: Reported on 01/14/2024)     QUEtiapine (SEROQUEL) 25 MG tablet Take by mouth. (Patient not taking: Reported on 01/14/2024)     No current facility-administered medications on file prior to visit.    Allergies  Allergen Reactions   Codeine Shortness Of Breath and Other (See Comments)    GI Upset  Pt states makes heart flutter and trouble breathing Other reaction(s): Other (See Comments) Chest Tightness, SOB, Tachycardia GI Upset  Pt states makes heart flutter and trouble breathing Other reaction(s): Other (See Comments) Chest Tightness, SOB, Tachycardia GI Upset  Pt states makes heart flutter and trouble breathing    Duloxetine Shortness Of Breath   Propoxyphene Shortness Of Breath    Other reaction(s): Other (See Comments) GI Upset   Alendronate Sodium     Other reaction(s): Other (See Comments) GI Upset   Atorvastatin     Other reaction(s): Other (See Comments) Myalgia   Benadryl [Diphenhydramine Hcl] Other (See Comments)    Restless legs   Cefuroxime Axetil Nausea Only   Cymbalta [Duloxetine Hcl]    Diphenhydramine    Lipitor [Atorvastatin Calcium]    Other     Other reaction(s): Other (See Comments) GI Upset   Prednisone     Other reaction(s): Other (See Comments) Chest Pain, SOB, can't breathe good Unable to sleep   Sulfa  Antibiotics Diarrhea   Levofloxacin Rash and Other (See Comments)    Altered mental status       Observations/Objective: There were no vitals filed for this visit.  There is no height or weight on file to calculate BMI.  Physical Exam Neurological:     Mental Status: She is alert.       Latest Ref Rng & Units 01/05/2024    3:23 PM 09/15/2023   11:32 AM 06/25/2023   11:12 AM  CBC  WBC 4.0 - 10.5 K/uL 9.0  9.4  7.2   Hemoglobin 12.0 - 15.0 g/dL 88.9  9.7  89.9   Hematocrit 36.0 - 46.0 % 34.4  30.2  31.8   Platelets 150 - 400 K/uL 249  274  254       Latest Ref Rng & Units 05/06/2023    3:59 AM 05/05/2023    3:46 PM 04/30/2019    3:32 AM  CMP  Glucose 70 - 99 mg/dL 91  895  90   BUN 8 - 23 mg/dL 18  24  13    Creatinine 0.44 - 1.00 mg/dL 9.08  8.83  8.91   Sodium 135 - 145 mmol/L 138  133  139   Potassium 3.5 -  5.1 mmol/L 3.8  4.0  4.7   Chloride 98 - 111 mmol/L 106  100  110   CO2 22 - 32 mmol/L 24  22  22    Calcium 8.9 - 10.3 mg/dL 9.0  9.3  8.7   Total Protein 6.5 - 8.1 g/dL  7.6    Total Bilirubin 0.0 - 1.2 mg/dL  0.6    Alkaline Phos 38 - 126 U/L  51    AST 15 - 41 U/L  21    ALT 0 - 44 U/L  12        ASSESSMENT & PLAN:   Iron  deficiency anemia Iron  deficiency anemia, she tolerated IV Venofer  treatments well. Lab Results  Component  Value Date   HGB 11.0 (L) 01/05/2024   TIBC 339 01/05/2024   IRONPCTSAT 13 01/05/2024   FERRITIN 63 01/05/2024    Hemoglobin has improved to 11.  Ferritin less than 200. Recommend additional Venofer  x 1 to further improve iron  stores.   CKD (chronic kidney disease) Encourage oral hydration and avoid nephrotoxins.    Malignant carcinoid tumor of ileum (HCC) #Remote history of neuroendocrine tumor- 2014.  She received Sandostatin  LAR 30 mg every 4 weeks for about 2 years. Patient is more than 10 years after her diagnosis.  March 2025 CT abdomen pelvis showed no recurrence.   Personal history of venous thrombosis and embolism Unprovoked thrombosis in 2013.  Status post IVC placement. Continue Xarelto  10mg  daily   Monocytosis Possibly reactive, secondary to chronic UTI.  She is on Macrobid  chronically for suppressive treatment.   Cannot rule out underlying bone marrow disorders. Given her age, shared decision was made to continue observation.   Orders Placed This Encounter  Procedures   CBC with Differential/Platelet    Standing Status:   Future    Expected Date:   07/13/2024    Expiration Date:   10/11/2024   Iron  and TIBC    Standing Status:   Future    Expected Date:   07/13/2024    Expiration Date:   10/11/2024   Ferritin    Standing Status:   Future    Expected Date:   07/13/2024    Expiration Date:   10/11/2024   Retic Panel    Standing Status:   Future    Expected Date:   07/13/2024    Expiration Date:   10/11/2024   Comprehensive metabolic panel with GFR    Standing Status:   Future    Expected Date:   07/13/2024    Expiration Date:   10/11/2024  Follow-up in 6 months.  Patient prefers virtual visit  Zelphia Cap, MD 01/14/2024 10:20 PM

## 2024-01-14 NOTE — Telephone Encounter (Signed)
 Pt had virtual visit today. I called pt granddaughter and confirmed the future appts that were needed per los

## 2024-01-14 NOTE — Assessment & Plan Note (Addendum)
 Possibly reactive, secondary to chronic UTI.  She is on Macrobid  chronically for suppressive treatment.   Cannot rule out underlying bone marrow disorders. Given her age, shared decision was made to continue observation.

## 2024-01-14 NOTE — Assessment & Plan Note (Signed)
 Unprovoked thrombosis in 2013.  Status post IVC placement. Continue Xarelto  10mg  daily

## 2024-01-14 NOTE — Addendum Note (Signed)
 Addended by: BABARA CALL on: 01/14/2024 10:25 PM   Modules accepted: Orders

## 2024-01-14 NOTE — Assessment & Plan Note (Signed)
 Encourage oral hydration and avoid nephrotoxins.

## 2024-01-14 NOTE — Assessment & Plan Note (Signed)
#  Remote history of neuroendocrine tumor- 2014.  She received Sandostatin  LAR 30 mg every 4 weeks for about 2 years. Patient is more than 10 years after her diagnosis.  March 2025 CT abdomen pelvis showed no recurrence.

## 2024-01-22 ENCOUNTER — Inpatient Hospital Stay

## 2024-02-02 ENCOUNTER — Inpatient Hospital Stay

## 2024-02-02 ENCOUNTER — Telehealth: Payer: Self-pay | Admitting: Oncology

## 2024-02-02 NOTE — Telephone Encounter (Signed)
 Pt granddaughter called to say that pt is not moving well today and wants to cancel iron  appt for today. Appt canceled and noted.

## 2024-05-13 ENCOUNTER — Inpatient Hospital Stay

## 2024-05-27 ENCOUNTER — Inpatient Hospital Stay: Admitting: Oncology
